# Patient Record
Sex: Female | Born: 1990
Health system: Southern US, Community
[De-identification: ages and names within clinical notes are randomized; demographics above are authoritative.]

## PROBLEM LIST (undated history)

## (undated) ENCOUNTER — Emergency Department: Admission: EM | Payer: BLUE CROSS/BLUE SHIELD | Source: Home / Self Care

## (undated) DIAGNOSIS — K219 Gastro-esophageal reflux disease without esophagitis: Secondary | ICD-10-CM

## (undated) DIAGNOSIS — E7800 Pure hypercholesterolemia, unspecified: Secondary | ICD-10-CM

## (undated) DIAGNOSIS — G43909 Migraine, unspecified, not intractable, without status migrainosus: Secondary | ICD-10-CM

## (undated) DIAGNOSIS — R109 Unspecified abdominal pain: Secondary | ICD-10-CM

## (undated) DIAGNOSIS — D509 Iron deficiency anemia, unspecified: Secondary | ICD-10-CM

## (undated) DIAGNOSIS — I10 Essential (primary) hypertension: Secondary | ICD-10-CM

## (undated) DIAGNOSIS — D649 Anemia, unspecified: Secondary | ICD-10-CM

## (undated) DIAGNOSIS — T8859XA Other complications of anesthesia, initial encounter: Secondary | ICD-10-CM

## (undated) DIAGNOSIS — T4145XA Adverse effect of unspecified anesthetic, initial encounter: Secondary | ICD-10-CM

## (undated) DIAGNOSIS — C4490 Unspecified malignant neoplasm of skin, unspecified: Secondary | ICD-10-CM

## (undated) HISTORY — DX: Migraine, unspecified, not intractable, without status migrainosus: G43.909

## (undated) HISTORY — PX: WISDOM TOOTH EXTRACTION: SHX21

## (undated) HISTORY — DX: Pure hypercholesterolemia, unspecified: E78.00

## (undated) HISTORY — PX: TONSILLECTOMY: SUR1361

## (undated) HISTORY — DX: Anemia, unspecified: D64.9

## (undated) HISTORY — PX: FOOT SURGERY: SHX648

## (undated) HISTORY — DX: Iron deficiency anemia, unspecified: D50.9

## (undated) HISTORY — DX: Essential (primary) hypertension: I10

## (undated) HISTORY — DX: Unspecified malignant neoplasm of skin, unspecified: C44.90

---
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
## 2011-06-09 ENCOUNTER — Telehealth: Payer: Self-pay | Admitting: Internal Medicine

## 2011-06-09 MED ORDER — BENZONATATE 100 MG PO CAPS: 100.0000 mg | ORAL_CAPSULE | Freq: Two times a day (BID) | ORAL | Status: AC | PRN

## 2011-06-09 NOTE — Telephone Encounter (Signed)
Patient's mother called and states she has had a cough x3 weeks and it's worse at night but she has a headache also has tried Desylm, Tylenol cold and nothing is working. Would like to know what you suggest.

## 2011-06-09 NOTE — Telephone Encounter (Signed)
Left message on cell phone voicemail for patient's mom (Tammy) to return call.

## 2011-06-09 NOTE — Telephone Encounter (Signed)
Patients mother advised as instructed via telephone.  She would like a Rx for [employer] sent to [facility].

## 2011-06-09 NOTE — Telephone Encounter (Signed)
We could try tessalon perles.  We need to verify no drug allergies and pharmacy and I will send in. Drink lots of fluids.  Treat sympotmatically with Mucinex, nasal saline irrigation, and Tylenol/Ibuprofen.

## 2011-06-18 ENCOUNTER — Other Ambulatory Visit: Payer: Self-pay | Admitting: Family Medicine

## 2011-06-30 ENCOUNTER — Telehealth: Payer: Self-pay | Admitting: Family Medicine

## 2011-06-30 NOTE — Telephone Encounter (Signed)
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

## 2011-06-30 NOTE — Telephone Encounter (Signed)
Yes ok for her to have TB test.

## 2011-07-02 NOTE — Telephone Encounter (Signed)
Spoke with Tammy patients mom and scheduled patient for nurse visit tomorrow.

## 2011-07-03 ENCOUNTER — Ambulatory Visit (INDEPENDENT_AMBULATORY_CARE_PROVIDER_SITE_OTHER): Payer: BC Managed Care – PPO | Admitting: *Deleted

## 2011-07-03 DIAGNOSIS — Z111 Encounter for screening for respiratory tuberculosis: Secondary | ICD-10-CM

## 2011-07-06 LAB — TB SKIN TEST
Induration: 0
TB Skin Test: NEGATIVE mm

## 2011-07-15 ENCOUNTER — Telehealth: Payer: Self-pay | Admitting: Family Medicine

## 2011-07-15 ENCOUNTER — Other Ambulatory Visit: Payer: Self-pay | Admitting: *Deleted

## 2011-07-15 MED ORDER — NORGESTIM-ETH ESTRAD TRIPHASIC 0.18/0.215/0.25 MG-35 MCG PO TABS: 1.0000 | ORAL_TABLET | Freq: Every day | ORAL | Status: DC

## 2011-07-15 NOTE — Telephone Encounter (Signed)
To: [facility] (Daytime Triage) Fax: [phone] From: [facility] Date/ Time: 07/15/2011 9:44 AM Taken By: Patria Mane, CSR Caller: Tammy Facility: not collected Patient: [name], [name] DOB: 1990-10-18 Phone: [phone] Reason for Call: Calling for a refill on daughter's Trisprintec. Mom states she called [facility] and they informed her to call the office since Rx did not have any refills. Regarding Appointment: Appt Date: Appt Time: Unknown Provider: Reason: Details: Outcome:

## 2011-07-15 NOTE — Telephone Encounter (Signed)
Rx sent to [facility] mom advised as instructed via telephone.

## 2012-01-21 ENCOUNTER — Encounter: Payer: Self-pay | Admitting: Family Medicine

## 2012-01-21 ENCOUNTER — Ambulatory Visit (INDEPENDENT_AMBULATORY_CARE_PROVIDER_SITE_OTHER): Payer: BC Managed Care – PPO | Admitting: Family Medicine

## 2012-01-21 ENCOUNTER — Ambulatory Visit: Payer: BC Managed Care – PPO | Admitting: Family Medicine

## 2012-01-21 VITALS — BP 108/72 | HR 72 | Temp 98.4°F | Wt 121.0 lb

## 2012-01-21 DIAGNOSIS — B49 Unspecified mycosis: Secondary | ICD-10-CM

## 2012-01-21 DIAGNOSIS — A499 Bacterial infection, unspecified: Secondary | ICD-10-CM

## 2012-01-21 DIAGNOSIS — B379 Candidiasis, unspecified: Secondary | ICD-10-CM

## 2012-01-21 DIAGNOSIS — R300 Dysuria: Secondary | ICD-10-CM

## 2012-01-21 DIAGNOSIS — B9689 Other specified bacterial agents as the cause of diseases classified elsewhere: Secondary | ICD-10-CM

## 2012-01-21 DIAGNOSIS — N760 Acute vaginitis: Secondary | ICD-10-CM

## 2012-01-21 LAB — POCT URINALYSIS DIPSTICK
Bilirubin, UA: NEGATIVE
Blood, UA: NEGATIVE
Glucose, UA: NEGATIVE
Ketones, UA: NEGATIVE
Leukocytes, UA: NEGATIVE
Nitrite, UA: NEGATIVE
Protein, UA: NEGATIVE
Spec Grav, UA: 1.01
Urobilinogen, UA: NEGATIVE
pH, UA: 7

## 2012-01-21 MED ORDER — METRONIDAZOLE 500 MG PO TABS: ORAL_TABLET | ORAL | Status: DC

## 2012-01-21 MED ORDER — FLUCONAZOLE 150 MG PO TABS: 150.0000 mg | ORAL_TABLET | Freq: Once | ORAL | Status: AC

## 2012-01-21 NOTE — Progress Notes (Signed)
Nature conservation officer at [facility] [address] [place] [address] Phone: [phone] Fax: [phone]  Date:  01/21/2012   Name:  [name]   DOB:  08/07/1990   MRN:  [id number] Gender: female  Age: 21 y.o.  PCP:  Ruthe Mannan, MD    Chief Complaint: Burning with urination, itching.   History of Present Illness:  CODY OLIGER is a 21 y.o. pleasant patient who presents with the following:  Dysuria Urgency  The patient is having some dysuria and urgency over the last few days. She is having some pain when she wipes after she goes to the bathroom. She denies any STD exposure, though she is sexually active. No abdominal pain. No significant vaginal discharge. No flank pain. No back pain. No hematuria.   Patient Active Problem List  Diagnosis  . ANEMIA-IRON DEFICIENCY  . ANEMIA, IRON DEFICIENCY, HX OF  . CONTACT DERMATITIS&OTHER ECZEMA DUE UNSPEC CAUSE    No past medical history on file.  No past surgical history on file.  History  Substance Use Topics  . Smoking status: Never Smoker   . Smokeless tobacco: Not on file  . Alcohol Use: Not on file    No family history on file.  No Known Allergies  Medication list has been reviewed and updated.  Current Outpatient Prescriptions on File Prior to Visit  Medication Sig Dispense Refill  . Norgestimate-Ethinyl Estradiol Triphasic (TRI-SPRINTEC) 0.18/0.215/0.25 MG-35 MCG tablet Take 1 tablet by mouth daily.  1 Package  12    Review of Systems:  ROS: GEN: Acute illness details above GI: Tolerating PO intake GU: maintaining adequate hydration and urination Pulm: No SOB Interactive and getting along well at home.  Otherwise, ROS is as per the HPI.   Physical Examination: Filed Vitals:   01/21/12 1507  BP: 108/72  Pulse: 72  Temp: 98.4 F (36.9 C)   Filed Vitals:   01/21/12 1507  Weight: 121 lb (54.885 kg)   There is no height on file to calculate BMI. Ideal Body Weight:     GEN: WDWN,  NAD, Non-toxic, Alert & Oriented x 3 HEENT: Atraumatic, Normocephalic.  Ears and Nose: No external deformity. EXTR: No clubbing/cyanosis/edema ABD: s, nt, nd, +BS GU: Normal introitus, exterior labia are moderately irritated appearing, and there is some small amount of discharge. Full pelvic not done, and patient vaginal wall was wiped for wet prep. NEURO: Normal gait.  PSYCH: Normally interactive. Conversant. Not depressed or anxious appearing.  Calm demeanor.    Assessment and Plan:  1. Dysuria  POCT urinalysis dipstick, Urine culture  2. Yeast infection    3. Bacterial vaginosis     Wet prep is consistent with notable yeast on slide, with some occasional BV. Will treat accordingly.  Obtain a urine culture given dysuria is a well, but urinalysis was normal.  Results for orders placed in visit on 01/21/12  POCT URINALYSIS DIPSTICK      Component Value Range   Color, UA yellow     Clarity, UA clear     Glucose, UA negative     Bilirubin, UA negative     Ketones, UA negative     Spec Grav, UA 1.010     Blood, UA negative     pH, UA 7.0     Protein, UA negative     Urobilinogen, UA negative     Nitrite, UA negative     Leukocytes, UA Negative      Orders  Today:  Orders Placed This Encounter  Procedures  . Urine culture  . POCT urinalysis dipstick    Medications Today: (Includes new updates added during medication reconciliation) Meds ordered this encounter  Medications  . fluconazole (DIFLUCAN) 150 MG tablet    Sig: Take 1 tablet (150 mg total) by mouth once.    Dispense:  1 tablet    Refill:  0  . metroNIDAZOLE (FLAGYL) 500 MG tablet    Sig: 4 tablets at once    Dispense:  4 tablet    Refill:  0    Medications Discontinued: There are no discontinued medications.   Hannah Beat, MD

## 2012-01-23 LAB — URINE CULTURE
Colony Count: NO GROWTH
Organism ID, Bacteria: NO GROWTH

## 2012-02-02 ENCOUNTER — Other Ambulatory Visit: Payer: Self-pay | Admitting: Family Medicine

## 2012-02-02 NOTE — Telephone Encounter (Signed)
Refill request for tri sprintec.  Patient hasn't been seen in over a year and has no upcoming appts scheduled.

## 2012-05-14 ENCOUNTER — Emergency Department: Payer: Self-pay | Admitting: Emergency Medicine

## 2012-07-07 ENCOUNTER — Other Ambulatory Visit: Payer: Self-pay | Admitting: Family Medicine

## 2012-07-19 ENCOUNTER — Other Ambulatory Visit: Payer: Self-pay | Admitting: Family Medicine

## 2012-07-22 ENCOUNTER — Other Ambulatory Visit: Payer: Self-pay | Admitting: Family Medicine

## 2012-07-22 ENCOUNTER — Telehealth: Payer: Self-pay | Admitting: Family Medicine

## 2012-07-22 MED ORDER — NORGESTIM-ETH ESTRAD TRIPHASIC 0.18/0.215/0.25 MG-35 MCG PO TABS: ORAL_TABLET | ORAL | Status: DC

## 2012-07-22 NOTE — Telephone Encounter (Signed)
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 Is this ok to schedule? Thank you.

## 2012-07-22 NOTE — Telephone Encounter (Signed)
appt has been scheduled.

## 2012-07-22 NOTE — Telephone Encounter (Signed)
pts mother left v/m requesting refill tri-sprintec until CPX 09/20/12. Pt last seen 07/15/10.Please advise.[facility].

## 2012-07-22 NOTE — Telephone Encounter (Signed)
Yes ok to schedule.

## 2012-07-22 NOTE — Telephone Encounter (Signed)
Pt's mother called in and says pt is due for a CPE w/Pap. Pt is in school and also works so can only be seen on Fridays, however you have no Friday CPE's on your schedule. Pt has one month of her birth control pills and will be out. Is there any way you can accommodate her a Friday CPE w/pap apptmt on a Friday w/in the next month? Thank you.

## 2012-07-22 NOTE — Telephone Encounter (Signed)
Sch pt apptmt 08/12/2012

## 2012-07-22 NOTE — Telephone Encounter (Signed)
Yes ok to accommodate. 

## 2012-07-26 ENCOUNTER — Ambulatory Visit: Payer: BC Managed Care – PPO

## 2012-07-26 ENCOUNTER — Other Ambulatory Visit: Payer: Self-pay | Admitting: Family Medicine

## 2012-07-26 DIAGNOSIS — Z862 Personal history of diseases of the blood and blood-forming organs and certain disorders involving the immune mechanism: Secondary | ICD-10-CM

## 2012-07-26 DIAGNOSIS — Z0000 Encounter for general adult medical examination without abnormal findings: Secondary | ICD-10-CM | POA: Insufficient documentation

## 2012-07-26 DIAGNOSIS — Z136 Encounter for screening for cardiovascular disorders: Secondary | ICD-10-CM

## 2012-08-05 ENCOUNTER — Other Ambulatory Visit: Payer: BC Managed Care – PPO

## 2012-08-09 ENCOUNTER — Telehealth: Payer: Self-pay

## 2012-08-09 ENCOUNTER — Other Ambulatory Visit: Payer: Self-pay | Admitting: Family Medicine

## 2012-08-09 NOTE — Telephone Encounter (Signed)
pts mother request lab test for hepatitis B titer. 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 Pt already scheduled for CPX labs on 09/13/12 and pts mother wants to know if can have CPX labs done when get hep B titer.Please advise.

## 2012-08-09 NOTE — Telephone Encounter (Signed)
Left message on voice mail advising mother test will be added on.

## 2012-08-09 NOTE — Telephone Encounter (Signed)
Yes ok to check Hep titer.

## 2012-08-10 ENCOUNTER — Other Ambulatory Visit: Payer: Self-pay | Admitting: Family Medicine

## 2012-08-10 ENCOUNTER — Other Ambulatory Visit (INDEPENDENT_AMBULATORY_CARE_PROVIDER_SITE_OTHER): Payer: BC Managed Care – PPO

## 2012-08-10 DIAGNOSIS — Z862 Personal history of diseases of the blood and blood-forming organs and certain disorders involving the immune mechanism: Secondary | ICD-10-CM

## 2012-08-10 DIAGNOSIS — Z136 Encounter for screening for cardiovascular disorders: Secondary | ICD-10-CM

## 2012-08-10 DIAGNOSIS — Z0000 Encounter for general adult medical examination without abnormal findings: Secondary | ICD-10-CM

## 2012-08-10 LAB — LIPID PANEL
HDL: 64.7 mg/dL (ref >39.00)
Total CHOL/HDL Ratio: 3
Triglycerides: 134 mg/dL (ref 0.0–149.0)

## 2012-08-10 LAB — CBC WITH DIFFERENTIAL/PLATELET
Basophils Absolute: 0 10*3/uL (ref 0.0–0.1)
Eosinophils Relative: 1.7 % (ref 0.0–5.0)
MCV: 80.4 fl (ref 78.0–100.0)
Monocytes Absolute: 0.4 10*3/uL (ref 0.1–1.0)
Neutrophils Relative %: 45.7 % (ref 43.0–77.0)
Platelets: 247 10*3/uL (ref 150.0–400.0)
RDW: 15.4 % — ABNORMAL HIGH (ref 11.5–14.6)
WBC: 5.6 10*3/uL (ref 4.5–10.5)

## 2012-08-10 LAB — COMPREHENSIVE METABOLIC PANEL
AST: 18 U/L (ref 0–37)
Alkaline Phosphatase: 57 U/L (ref 39–117)
BUN: 8 mg/dL (ref 6–23)
Calcium: 8.9 mg/dL (ref 8.4–10.5)
Creatinine, Ser: 0.8 mg/dL (ref 0.4–1.2)

## 2012-08-10 LAB — LDL CHOLESTEROL, DIRECT: Direct LDL: 120.5 mg/dL

## 2012-08-11 ENCOUNTER — Other Ambulatory Visit (INDEPENDENT_AMBULATORY_CARE_PROVIDER_SITE_OTHER): Payer: BC Managed Care – PPO

## 2012-08-11 DIAGNOSIS — Z0000 Encounter for general adult medical examination without abnormal findings: Secondary | ICD-10-CM

## 2012-08-11 DIAGNOSIS — Z862 Personal history of diseases of the blood and blood-forming organs and certain disorders involving the immune mechanism: Secondary | ICD-10-CM

## 2012-08-11 DIAGNOSIS — Z136 Encounter for screening for cardiovascular disorders: Secondary | ICD-10-CM

## 2012-08-11 DIAGNOSIS — Z0184 Encounter for antibody response examination: Secondary | ICD-10-CM

## 2012-08-11 LAB — LIPID PANEL: Cholesterol: 256 mg/dL — ABNORMAL HIGH (ref 0–200)

## 2012-08-11 LAB — COMPREHENSIVE METABOLIC PANEL
Albumin: 3.3 g/dL — ABNORMAL LOW (ref 3.5–5.2)
Alkaline Phosphatase: 63 U/L (ref 39–117)
BUN: 10 mg/dL (ref 6–23)
CO2: 25 mEq/L (ref 19–32)
GFR: 104.39 mL/min (ref >60.00)
Glucose, Bld: 84 mg/dL (ref 70–99)
Potassium: 3.9 mEq/L (ref 3.5–5.1)
Total Bilirubin: 0.5 mg/dL (ref 0.3–1.2)
Total Protein: 6.9 g/dL (ref 6.0–8.3)

## 2012-08-11 LAB — CBC WITH DIFFERENTIAL/PLATELET
Basophils Relative: 0.3 % (ref 0.0–3.0)
Eosinophils Relative: 2.1 % (ref 0.0–5.0)
MCV: 77.3 fl — ABNORMAL LOW (ref 78.0–100.0)
Monocytes Absolute: 0.3 10*3/uL (ref 0.1–1.0)
Monocytes Relative: 5.8 % (ref 3.0–12.0)
Neutrophils Relative %: 52.9 % (ref 43.0–77.0)
Platelets: 269 10*3/uL (ref 150.0–400.0)
RBC: 4.5 Mil/uL (ref 3.87–5.11)
WBC: 5.8 10*3/uL (ref 4.5–10.5)

## 2012-08-11 LAB — LDL CHOLESTEROL, DIRECT: Direct LDL: 175.2 mg/dL

## 2012-08-12 ENCOUNTER — Encounter: Payer: Self-pay | Admitting: Family Medicine

## 2012-08-12 ENCOUNTER — Ambulatory Visit (INDEPENDENT_AMBULATORY_CARE_PROVIDER_SITE_OTHER): Payer: BC Managed Care – PPO | Admitting: Family Medicine

## 2012-08-12 VITALS — BP 122/84 | HR 76 | Temp 98.8°F | Ht 63.75 in | Wt 143.0 lb

## 2012-08-12 DIAGNOSIS — Z309 Encounter for contraceptive management, unspecified: Secondary | ICD-10-CM

## 2012-08-12 DIAGNOSIS — Z0000 Encounter for general adult medical examination without abnormal findings: Secondary | ICD-10-CM

## 2012-08-12 DIAGNOSIS — Z862 Personal history of diseases of the blood and blood-forming organs and certain disorders involving the immune mechanism: Secondary | ICD-10-CM

## 2012-08-12 MED ORDER — NORGESTIM-ETH ESTRAD TRIPHASIC 0.18/0.215/0.25 MG-35 MCG PO TABS: ORAL_TABLET | ORAL | Status: DC

## 2012-08-12 MED ORDER — FERROUS SULFATE 325 (65 FE) MG PO TABS: 325.0000 mg | ORAL_TABLET | Freq: Every day | ORAL | Status: DC

## 2012-08-12 NOTE — Patient Instructions (Addendum)
Please restart your iron- once a day. Come see me after your birthday for your first pap smear.  Congratulations on your engagement!

## 2012-08-12 NOTE — Progress Notes (Signed)
Subjective:    Patient ID: Natalie Wu, female    DOB: August 19, 1990, 22 y.o.   MRN: [id number]  HPI 22 yo very pleasant female here for CPX.  Doing well.  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  Got engaged recently!  H/o iron deficiency anemia- was taking oral  iron 325 mg daily but stopped taking it because she felt it was not working.   Was referred to hematology, Dr. Francee Piccolo and work up was negative.  Twin sister was referred for same issue and has had issues with iron deficiency anemia since she was a small child.   Has had two receive two courses of IV iron, most recently October 2011. Had blood transfusion last year.  Denies any fatigue, SOB, DOE.   Lab Results  Component Value Date   WBC 5.6 08/10/2012   HGB 11.5* 08/10/2012   HCT 34.2* 08/10/2012   MCV 80.4 08/10/2012   PLT 247.0 08/10/2012    She is sexually active with her fiance only.  On Sprintec to control her periods, to prevent heavy bleeding from worsening her anemia although she was diagnosed with anemia prior to menstruating  Lab Results  Component Value Date   CHOL 205* 08/10/2012   HDL 64.70 08/10/2012   LDLDIRECT 120.5 08/10/2012   TRIG 134.0 08/10/2012   CHOLHDL 3 08/10/2012    Review of Systems See HPI Patient reports no  vision/ hearing changes,anorexia, weight change, fever ,adenopathy, persistant / recurrent hoarseness, swallowing issues, chest pain, edema,persistant / recurrent cough, hemoptysis, dyspnea(rest, exertional, paroxysmal nocturnal), gastrointestinal  bleeding (melena, rectal bleeding), abdominal pain, excessive heart burn, GU symptoms(dysuria, hematuria, pyuria, voiding/incontinence  Issues) syncope, focal weakness, severe memory loss, concerning skin lesions, depression, anxiety, abnormal bruising/bleeding, major joint swelling, breast masses or abnormal vaginal bleeding.       Objective:   Physical Exam BP 122/84  Pulse 76  Temp(Src) 98.8 F (37.1 C)  Ht 5' 3.75" (1.619 m)  Wt  143 lb (64.864 kg)  BMI 24.75 kg/m2  General:  Well-developed,well-nourished,in no acute distress; alert,appropriate and cooperative throughout examination Head:  normocephalic and atraumatic.   Eyes:  vision grossly intact, pupils equal, pupils round, and pupils reactive to light.   Ears:  R ear normal and L ear normal.   Nose:  no external deformity.   Mouth:  good dentition.   Neck:  No deformities, masses, or tenderness noted. Lungs:  Normal respiratory effort, chest expands symmetrically. Lungs are clear to auscultation, no crackles or wheezes. Heart:  Normal rate and regular rhythm. S1 and S2 normal without gallop, murmur, click, rub or other extra sounds. Abdomen:  Bowel sounds positive,abdomen soft and non-tender without masses, organomegaly or hernias noted. Msk:  No deformity or scoliosis noted of thoracic or lumbar spine.   Extremities:  No clubbing, cyanosis, edema, or deformity noted with normal full range of motion of all joints.   Neurologic:  alert & oriented X3 and gait normal.   Skin:  Intact without suspicious lesions or rashes Cervical Nodes:  No lymphadenopathy noted Axillary Nodes:  No palpable lymphadenopathy Psych:  Cognition and judgment appear intact. Alert and cooperative with normal attention span and concentration. No apparent delusions, illusions, hallucinations    Assessment & Plan:  1. Routine general medical examination at a health care facility Reviewed preventive care protocols, scheduled due services, and updated immunizations Discussed nutrition, exercise, diet, and healthy lifestyle.    2. ANEMIA, IRON DEFICIENCY, HX OF H/H has dropped since we  last checked CBC two years ago.  Advised restarting iron.  Recheck CBC in 3-6 months.  3. Contraception management Doing well with current OCPs.  Rx refilled.

## 2012-08-15 ENCOUNTER — Telehealth: Payer: Self-pay

## 2012-08-15 NOTE — Telephone Encounter (Signed)
Pt request recent lab results.

## 2012-08-15 NOTE — Telephone Encounter (Signed)
Patient has been advised

## 2012-08-17 ENCOUNTER — Ambulatory Visit (INDEPENDENT_AMBULATORY_CARE_PROVIDER_SITE_OTHER): Payer: BC Managed Care – PPO | Admitting: *Deleted

## 2012-08-17 DIAGNOSIS — Z111 Encounter for screening for respiratory tuberculosis: Secondary | ICD-10-CM

## 2012-08-17 DIAGNOSIS — Z23 Encounter for immunization: Secondary | ICD-10-CM

## 2012-08-17 NOTE — Telephone Encounter (Signed)
Patient came in for Hep B and PPD and she's had the Hep B series ( copy of her vaccines sent for scanning) and even though her titer came back not immune the pt and her mother are questioning if she needs the series again? I spoke with Dr.Copland and he advised giving 1 injection and to come back for a titer? Also he wanted me to forward to Dr. Dayton Martes and see if the series is needed?

## 2012-08-17 NOTE — Telephone Encounter (Signed)
How long should she wait before she has the titre done?

## 2012-08-17 NOTE — Telephone Encounter (Signed)
I would wait 2-3 weeks.

## 2012-08-17 NOTE — Telephone Encounter (Signed)
Yes I agree that we could give her one and recheck the titers.

## 2012-08-18 NOTE — Telephone Encounter (Signed)
Advised mother.  Lab appt scheduled.

## 2012-08-18 NOTE — Telephone Encounter (Signed)
Left message asking mother to call back

## 2012-08-19 LAB — TB SKIN TEST: TB Skin Test: NEGATIVE

## 2012-09-05 ENCOUNTER — Other Ambulatory Visit (INDEPENDENT_AMBULATORY_CARE_PROVIDER_SITE_OTHER): Payer: BC Managed Care – PPO

## 2012-09-05 DIAGNOSIS — Z9189 Other specified personal risk factors, not elsewhere classified: Secondary | ICD-10-CM

## 2012-09-06 LAB — HEPATITIS B SURFACE ANTIBODY,QUALITATIVE: Hep B Surface Ab, Qual: REACTIVE

## 2012-09-07 ENCOUNTER — Other Ambulatory Visit: Payer: Self-pay | Admitting: Family Medicine

## 2012-09-13 ENCOUNTER — Other Ambulatory Visit: Payer: BC Managed Care – PPO

## 2012-09-19 ENCOUNTER — Encounter: Payer: Self-pay | Admitting: *Deleted

## 2012-09-20 ENCOUNTER — Ambulatory Visit (INDEPENDENT_AMBULATORY_CARE_PROVIDER_SITE_OTHER): Payer: BC Managed Care – PPO | Admitting: Family Medicine

## 2012-09-20 ENCOUNTER — Encounter: Payer: Self-pay | Admitting: Family Medicine

## 2012-09-20 VITALS — BP 110/74 | HR 60 | Temp 98.2°F | Ht 63.5 in | Wt 121.0 lb

## 2012-09-20 DIAGNOSIS — Z862 Personal history of diseases of the blood and blood-forming organs and certain disorders involving the immune mechanism: Secondary | ICD-10-CM

## 2012-09-20 DIAGNOSIS — Z0000 Encounter for general adult medical examination without abnormal findings: Secondary | ICD-10-CM

## 2012-09-20 DIAGNOSIS — E785 Hyperlipidemia, unspecified: Secondary | ICD-10-CM

## 2012-09-20 MED ORDER — NORGESTIM-ETH ESTRAD TRIPHASIC 0.18/0.215/0.25 MG-35 MCG PO TABS: ORAL_TABLET | ORAL | Status: DC

## 2012-09-20 NOTE — Patient Instructions (Addendum)
Please follow low cholesterol diet as discussed.  Then come back in 2 months to recheck your cholesterol. Great to see you and good luck with school!

## 2012-09-20 NOTE — Progress Notes (Signed)
Subjective:    Patient ID: [name], female    DOB: 11-11-1990, 22 y.o.   MRN: [id number]  HPI  22 yo very pleasant female here for CPX.  School is going well.  Chief complaint states CPX and pap smear but I did explain to [place] that I do not perform pap smears on females less than 88 years old.  She is in agreement with this plan.  She is taking OCPs, Trisprintec.  Denies any spotting during the month.  Not currently sexually active. Would like refills on this today.  HLD- strong family h/o HLD- grandmother, dad.  Does not eat red meat.  Does admit to eating french fries regularly. Lab Results  Component Value Date   CHOL 256* 08/11/2012   HDL 63.50 08/11/2012   LDLDIRECT 175.2 08/11/2012   TRIG 189.0* 08/11/2012   CHOLHDL 4 08/11/2012   H/o anemia- periods are much better now that she is on OCPs.  Denies fatigue. Lab Results  Component Value Date   WBC 5.8 08/11/2012   HGB 11.5* 08/11/2012   HCT 34.8* 08/11/2012   MCV 77.3* 08/11/2012   PLT 269.0 08/11/2012    Patient Active Problem List  Diagnosis  . ANEMIA, IRON DEFICIENCY, HX OF  . Routine general medical examination at a health care facility   No past medical history on file. No past surgical history on file. History  Substance Use Topics  . Smoking status: Never Smoker   . Smokeless tobacco: Not on file  . Alcohol Use: Not on file   No family history on file. No Known Allergies Current Outpatient Prescriptions on File Prior to Visit  Medication Sig Dispense Refill  . TRI-SPRINTEC 0.18/0.215/0.25 MG-35 MCG tablet TAKE ONE TABLET BY MOUTH ONE TIME DAILY  1 Package  0   No current facility-administered medications on file prior to visit.   The PMH, PSH, Social History, Family History, Medications, and allergies have been reviewed in [facility], and have been updated if relevant.   Review of Systems See HPI Patient reports no  vision/ hearing changes,anorexia, weight change, fever ,adenopathy, persistant /  recurrent hoarseness, swallowing issues, chest pain, edema,persistant / recurrent cough, hemoptysis, dyspnea(rest, exertional, paroxysmal nocturnal), gastrointestinal  bleeding (melena, rectal bleeding), abdominal pain, excessive heart burn, GU symptoms(dysuria, hematuria, pyuria, voiding/incontinence  Issues) syncope, focal weakness, severe memory loss, concerning skin lesions, depression, anxiety, abnormal bruising/bleeding, major joint swelling, breast masses or abnormal vaginal bleeding.       Objective:   Physical Exam BP 110/74  Pulse 60  Temp(Src) 98.2 F (36.8 C)  Ht 5' 3.5" (1.613 m)  Wt 121 lb (54.885 kg)  BMI 21.1 kg/m2  General:  Well-developed,well-nourished,in no acute distress; alert,appropriate and cooperative throughout examination Head:  normocephalic and atraumatic.   Eyes:  vision grossly intact, pupils equal, pupils round, and pupils reactive to light.   Ears:  R ear normal and L ear normal.   Nose:  no external deformity.   Mouth:  good dentition.   Neck:  No deformities, masses, or tenderness noted.  Lungs:  Normal respiratory effort, chest expands symmetrically. Lungs are clear to auscultation, no crackles or wheezes. Heart:  Normal rate and regular rhythm. S1 and S2 normal without gallop, murmur, click, rub or other extra sounds. Abdomen:  Bowel sounds positive,abdomen soft and non-tender without masses, organomegaly or hernias noted. Msk:  No deformity or scoliosis noted of thoracic or lumbar spine.   Extremities:  No clubbing, cyanosis, edema, or deformity noted with normal  full range of motion of all joints.   Neurologic:  alert & oriented X3 and gait normal.   Skin:  Intact without suspicious lesions or rashes Cervical Nodes:  No lymphadenopathy noted Axillary Nodes:  No palpable lymphadenopathy Psych:  Cognition and judgment appear intact. Alert and cooperative with normal attention span and concentration. No apparent delusions, illusions,  hallucinations        Assessment & Plan:  1. Routine general medical examination at a health care facility Reviewed preventive care protocols, scheduled due services, and updated immunizations Discussed nutrition, exercise, diet, and healthy lifestyle.  2. ANEMIA, IRON DEFICIENCY, HX OF Recheck CBC in 8 weeks.  3. HLD (hyperlipidemia) New- discussed with pt.  Given that she is of child bearing age, I would prefer to work on diet first and [place] is agreement with this. Handout given about cholesterol.  Follow up labs in 8 weeks. The patient indicates understanding of these issues and agrees with the plan.

## 2012-11-21 ENCOUNTER — Ambulatory Visit: Payer: BC Managed Care – PPO | Admitting: Family Medicine

## 2012-11-21 DIAGNOSIS — Z0289 Encounter for other administrative examinations: Secondary | ICD-10-CM

## 2013-03-06 ENCOUNTER — Other Ambulatory Visit: Payer: Self-pay | Admitting: Family Medicine

## 2013-03-21 ENCOUNTER — Other Ambulatory Visit: Payer: Self-pay | Admitting: Family Medicine

## 2013-06-19 ENCOUNTER — Telehealth: Payer: Self-pay

## 2013-06-19 NOTE — Telephone Encounter (Signed)
Pt request when had last PPD; advised PPD done 08/17/2012. Pt voiced understanding.

## 2013-06-19 NOTE — Telephone Encounter (Signed)
Opened in error

## 2013-07-26 ENCOUNTER — Other Ambulatory Visit: Payer: Self-pay | Admitting: Family Medicine

## 2013-07-27 ENCOUNTER — Other Ambulatory Visit: Payer: Self-pay | Admitting: Family Medicine

## 2013-08-01 ENCOUNTER — Telehealth: Payer: Self-pay

## 2013-08-01 NOTE — Telephone Encounter (Signed)
[name] wanted to verify pt had refills on trispintec; advised would need to ck with pharmacy. [name] at [facility] told me pt has available refills.

## 2013-08-04 ENCOUNTER — Encounter: Payer: Self-pay | Admitting: Family Medicine

## 2013-08-04 ENCOUNTER — Ambulatory Visit (INDEPENDENT_AMBULATORY_CARE_PROVIDER_SITE_OTHER): Payer: BC Managed Care – PPO | Admitting: Family Medicine

## 2013-08-04 VITALS — BP 114/62 | HR 87 | Temp 98.5°F | Ht 63.0 in | Wt 144.5 lb

## 2013-08-04 DIAGNOSIS — Z309 Encounter for contraceptive management, unspecified: Secondary | ICD-10-CM

## 2013-08-04 DIAGNOSIS — Z862 Personal history of diseases of the blood and blood-forming organs and certain disorders involving the immune mechanism: Secondary | ICD-10-CM

## 2013-08-04 NOTE — Patient Instructions (Signed)
I will call you with your lab results  Please schedule your physical/pap smear on your way out.

## 2013-08-04 NOTE — Assessment & Plan Note (Signed)
Remains symptomatic despite light periods. Will recheck CBC today.  May need to re establish with hematology for IV iron. The patient indicates understanding of these issues and agrees with the plan.

## 2013-08-04 NOTE — Assessment & Plan Note (Signed)
Tri sprintec refilled. Advised she needs to get a pap smear/gyn exam now that she is 22.  Currently menstruating and would like to schedule this on another day. See AVS.

## 2013-08-04 NOTE — Addendum Note (Signed)
Addended by: Baldomero LamyHAVERS, Essie Lagunes C on: 08/04/2013 01:47 PM   Modules accepted: Orders

## 2013-08-04 NOTE — Progress Notes (Signed)
Pre visit review using our clinic review tool, if applicable. No additional management support is needed unless otherwise documented below in the visit note. 

## 2013-08-04 NOTE — Progress Notes (Signed)
  Subjective:    Patient ID: Natalie Wu, female    DOB: [birth year], 23 y.o.   MRN: [id number]  HPI 23 yo pleasant female here for "med refill." She is sexually active with her fiance only.  Has never had a pap smear.  On Sprintec to control her periods, to prevent heavy bleeding from worsening her anemia although she was diagnosed with anemia prior to menstruating  Lab Results  Component Value Date   CHOL 205* 08/10/2012   HDL 64.70 08/10/2012   LDLDIRECT 120.5 08/10/2012   TRIG 134.0 08/10/2012   CHOLHDL 3 08/10/2012   H/o iron deficiency anemia- was taking oral iron 325 mg daily but stopped taking it because she felt it was not working. Was referred to hematology, Dr. Francee PiccoloFennigan and work up was negative.  Twin sister was referred for same issue and has had issues with iron deficiency anemia since she was a small child.  Has had two receive two courses of IV iron, most recently October 2011. Had blood transfusion two years ago.  Advised restarting iron when I saw her last year and following up CBC 3 months later.  She did restart it (325 mg daily) but was lost to follow up. Lab Results  Component Value Date   WBC 5.6 08/10/2012   HGB 11.5* 08/10/2012   HCT 34.2* 08/10/2012   MCV 80.4 08/10/2012   PLT 247.0 08/10/2012      Review of Systems See HPI + fatigue Periods not heavy +/- dizziness when she stands from seated position     Objective:   Physical Exam BP 114/62  Pulse 87  Temp(Src) 98.5 F (36.9 C) (Oral)  Ht 5\' 3"  (1.6 m)  Wt 144 lb 8 oz (65.545 kg)  BMI 25.60 kg/m2  SpO2 98%  LMP 07/31/2013  General:  Well-developed,well-nourished,in no acute distress; alert,appropriate and cooperative throughout examination Head:  normocephalic and atraumatic.   Nose:  no external deformity.   Mouth:  good dentition.   Neck:  No deformities, masses, or tenderness noted. Lungs:  Normal respiratory effort, chest expands symmetrically. Lungs are clear to auscultation, no crackles or  wheezes. Heart:  Normal rate and regular rhythm. S1 and S2 normal without gallop, murmur, click, rub or other extra sounds. Neurologic:  alert & oriented X3 and gait normal.   Skin:  Intact without suspicious lesions or rashes Psych:  Cognition and judgment appear intact. Alert and cooperative with normal attention span and concentration. No apparent delusions, illusions, hallucinations    Assessment & Plan:

## 2013-08-05 LAB — CBC WITH DIFFERENTIAL/PLATELET
BASOS: 0 %
Basophils Absolute: 0 10*3/uL (ref 0.0–0.2)
EOS ABS: 0 10*3/uL (ref 0.0–0.4)
Eos: 1 %
HEMATOCRIT: 34.8 % (ref 34.0–46.6)
Hemoglobin: 11.2 g/dL (ref 11.1–15.9)
IMMATURE GRANS (ABS): 0 10*3/uL (ref 0.0–0.1)
Immature Granulocytes: 0 %
LYMPHS ABS: 2.3 10*3/uL (ref 0.7–3.1)
Lymphs: 27 %
MCH: 25.2 pg — AB (ref 26.6–33.0)
MCHC: 32.2 g/dL (ref 31.5–35.7)
MCV: 78 fL — AB (ref 79–97)
MONOS ABS: 0.4 10*3/uL (ref 0.1–0.9)
Monocytes: 5 %
NEUTROS PCT: 67 %
Neutrophils Absolute: 5.7 10*3/uL (ref 1.4–7.0)
RBC: 4.45 x10E6/uL (ref 3.77–5.28)
RDW: 14.7 % (ref 12.3–15.4)
WBC: 8.4 10*3/uL (ref 3.4–10.8)

## 2013-08-15 ENCOUNTER — Ambulatory Visit (INDEPENDENT_AMBULATORY_CARE_PROVIDER_SITE_OTHER): Payer: BC Managed Care – PPO | Admitting: Family Medicine

## 2013-08-15 ENCOUNTER — Encounter: Payer: Self-pay | Admitting: Family Medicine

## 2013-08-15 VITALS — BP 114/64 | HR 71 | Temp 97.9°F | Ht 63.0 in | Wt 114.5 lb

## 2013-08-15 DIAGNOSIS — Z309 Encounter for contraceptive management, unspecified: Secondary | ICD-10-CM | POA: Insufficient documentation

## 2013-08-15 MED ORDER — NORGESTIM-ETH ESTRAD TRIPHASIC 0.18/0.215/0.25 MG-35 MCG PO TABS: ORAL_TABLET | ORAL | Status: DC

## 2013-08-15 NOTE — Assessment & Plan Note (Signed)
She is currently on her period and would like to schedule Pap smear for another day. Trisprintec refilled for 1 year.

## 2013-08-15 NOTE — Progress Notes (Signed)
Pre visit review using our clinic review tool, if applicable. No additional management support is needed unless otherwise documented below in the visit note. 

## 2013-08-15 NOTE — Patient Instructions (Signed)
Good to see you. Please make sure you schedule a pap smear at your convenience.

## 2013-08-15 NOTE — Progress Notes (Signed)
   Subjective:   Patient ID: [name], female    DOB: September 14, 1990, 23 y.o.   MRN: [id number]  [name] is a pleasant 23 y.o. year old female who presents to clinic today with Medication Refill  on 08/15/2013  HPI: On Trisprintec for OCPs.  Denies any spotting during the month.  Cramping is minimal.  Currently on her period.  Has never had a pap smear.  Non smoker. Very physically active with her job.  Patient Active Problem List   Diagnosis Date Noted  . Contraception management 08/15/2013  . Routine general medical examination at a health care facility 09/20/2012  . HLD (hyperlipidemia) 09/20/2012  . ANEMIA, IRON DEFICIENCY, HX OF 06/18/2010   No past medical history on file. No past surgical history on file. History  Substance Use Topics  . Smoking status: Never Smoker   . Smokeless tobacco: Not on file  . Alcohol Use: Not on file   No family history on file. No Known Allergies No current outpatient prescriptions on file prior to visit.   No current facility-administered medications on file prior to visit.   The PMH, PSH, Social History, Family History, Medications, and allergies have been reviewed in [facility], and have been updated if relevant.  Review of Systems    See HPI Denies any vaginal discharge or irregular vaginal bleeding Objective:    BP 114/64  Pulse 71  Temp(Src) 97.9 F (36.6 C) (Oral)  Ht 5\' 3"  (1.6 m)  Wt 114 lb 8 oz (51.937 kg)  BMI 20.29 kg/m2  SpO2 99%  LMP 08/14/2013   Physical Exam Gen:  Alert, pleasant, NAD Psych:  Good eye contact.  Not anxious or depressed appearing.       Assessment & Plan:   Contraception management Return in about 7 days (around 08/22/2013) for Pap smear.

## 2013-08-27 ENCOUNTER — Other Ambulatory Visit: Payer: Self-pay | Admitting: Family Medicine

## 2013-09-12 ENCOUNTER — Emergency Department: Payer: Self-pay | Admitting: Internal Medicine

## 2013-09-22 ENCOUNTER — Ambulatory Visit (INDEPENDENT_AMBULATORY_CARE_PROVIDER_SITE_OTHER): Payer: BC Managed Care – PPO | Admitting: Internal Medicine

## 2013-09-22 ENCOUNTER — Encounter: Payer: Self-pay | Admitting: Internal Medicine

## 2013-09-22 VITALS — BP 122/84 | HR 74 | Temp 99.4°F | Wt 114.2 lb

## 2013-09-22 DIAGNOSIS — N898 Other specified noninflammatory disorders of vagina: Secondary | ICD-10-CM

## 2013-09-22 DIAGNOSIS — N390 Urinary tract infection, site not specified: Secondary | ICD-10-CM

## 2013-09-22 DIAGNOSIS — R300 Dysuria: Secondary | ICD-10-CM

## 2013-09-22 LAB — POCT URINALYSIS DIPSTICK
Bilirubin, UA: NEGATIVE
Glucose, UA: NEGATIVE
Ketones, UA: NEGATIVE
Nitrite, UA: NEGATIVE
PH UA: 6
Protein, UA: NEGATIVE
SPEC GRAV UA: 1.015
Urobilinogen, UA: 0.2

## 2013-09-22 MED ORDER — VALACYCLOVIR HCL 1 G PO TABS: 1000.0000 mg | ORAL_TABLET | Freq: Two times a day (BID) | ORAL | Status: DC

## 2013-09-22 MED ORDER — NITROFURANTOIN MONOHYD MACRO 100 MG PO CAPS: 100.0000 mg | ORAL_CAPSULE | Freq: Two times a day (BID) | ORAL | Status: DC

## 2013-09-22 NOTE — Patient Instructions (Addendum)
Genital Herpes  Genital herpes is a sexually transmitted disease. This means that it is a disease passed by having sex with an infected person. There is no cure for genital herpes. The time between attacks can be months to years. The virus may live in a person but produce no problems (symptoms). This infection can be passed to a baby as it travels down the birth canal (vagina). In a newborn, this can cause central nervous system damage, eye damage, or even death. The virus that causes genital herpes is usually HSV-2 virus. The virus that causes oral herpes is usually HSV-1. The diagnosis (learning what is wrong) is made through culture results.  SYMPTOMS   Usually symptoms of pain and itching begin a few days to a week after contact. It first appears as small blisters that progress to small painful ulcers which then scab over and heal after several days. It affects the outer genitalia, birth canal, cervix, penis, anal area, buttocks, and thighs.  HOME CARE INSTRUCTIONS   · Keep ulcerated areas dry and clean.  · Take medications as directed. Antiviral medications can speed up healing. They will not prevent recurrences or cure this infection. These medications can also be taken for suppression if there are frequent recurrences.  · While the infection is active, it is contagious. Avoid all sexual contact during active infections.  · Condoms may help prevent spread of the herpes virus.  · Practice safe sex.  · Wash your hands thoroughly after touching the genital area.  · Avoid touching your eyes after touching your genital area.  · Inform your caregiver if you have had genital herpes and become pregnant. It is your responsibility to insure a safe outcome for your baby in this pregnancy.  · Only take over-the-counter or prescription medicines for pain, discomfort, or fever as directed by your caregiver.  SEEK MEDICAL CARE IF:   · You have a recurrence of this infection.  · You do not respond to medications and are not  improving.  · You have new sources of pain or discharge which have changed from the original infection.  · You have an oral temperature above 102° F (38.9° C).  · You develop abdominal pain.  · You develop eye pain or signs of eye infection.  Document Released: 05/15/2000 Document Revised: 08/10/2011 Document Reviewed: 06/05/2009  ExitCare® Patient Information ©2014 [employer].

## 2013-09-22 NOTE — Progress Notes (Signed)
HPI  Pt presents to the clinic today with c/o dysuria. She reports this started 1 week ago. She denies fever, chills, nausea or back pain. She has never had a UTI in the past. She has not tried anything OTC.  Additionally, she c/o bumps in her vaginal area. She noticed this yesterday. She has never had bumps like this in the past. The bumps do no itch or burn. She denies vaginal discharge, or odor. She is sexually active. She does have unprotected sex but is on birth control.   Review of Systems  History reviewed. No pertinent past medical history.  History reviewed. No pertinent family history.  History   Social History  . Marital Status: Single    Spouse Name: N/A    Number of Children: N/A  . Years of Education: N/A   Occupational History  . Not on file.   Social History Main Topics  . Smoking status: Never Smoker   . Smokeless tobacco: Not on file  . Alcohol Use: No  . Drug Use: Not on file  . Sexual Activity: Not on file   Other Topics Concern  . Not on file   Social History Narrative  . No narrative on file    No Known Allergies  Constitutional: Denies fever, malaise, fatigue, headache or abrupt weight changes.   GU: Pt reports pain with urination. Denies burning sensation, blood in urine, odor or discharge. Skin: Pt reports vaginal lesion. Denies redness, rashes, or ulcercations.   No other specific complaints in a complete review of systems (except as listed in HPI above).    Objective:   Physical Exam  BP 122/84  Pulse 74  Temp(Src) 99.4 F (37.4 C) (Oral)  Wt 114 lb 4 oz (51.823 kg)  SpO2 99% Wt Readings from Last 3 Encounters:  09/22/13 114 lb 4 oz (51.823 kg)  08/15/13 114 lb 8 oz (51.937 kg)  09/20/12 121 lb (54.885 kg)    General: Appears her stated age, well developed, well nourished in NAD. Cardiovascular: Normal rate and rhythm. S1,S2 noted.  No murmur, rubs or gallops noted. No JVD or BLE edema. No carotid bruits  noted. Pulmonary/Chest: Normal effort and positive vesicular breath sounds. No respiratory distress. No wheezes, rales or ronchi noted.  Abdomen: Soft and nontender. Normal bowel sounds, no bruits noted. No distention or masses noted. Liver, spleen and kidneys non palpable. Tender to palpation over the bladder area. No CVA tenderness. Pelvic: several round ulcerations noted inside labia majoria. Erythematous base draining tan fluid, tender to touch.     Assessment & Plan:  Dysuria secondary to UTI:  Urinalysis: mod leuks, trace blood eRx sent if for Macrobid 100 mg BID x 5 days OK to take AZO OTC Drink plenty of fluids  Vaginal lesions:  Concerning for genital herpes Viral culture today Will start valtrex Discussed the importance of use protection to prevent spreading the virus  RTC as needed or if symptoms persist.

## 2013-09-22 NOTE — Progress Notes (Signed)
Pre visit review using our clinic review tool, if applicable. No additional management support is needed unless otherwise documented below in the visit note. 

## 2013-09-25 ENCOUNTER — Telehealth: Payer: Self-pay

## 2013-09-25 NOTE — Telephone Encounter (Signed)
[name] with [facility] request source of culture from 09/22/13. Advised dx vaginal lesions. [name] voiced understanding.

## 2013-09-28 LAB — VIRAL CULTURE VIRC: ORGANISM ID, BACTERIA: NEGATIVE

## 2014-04-19 ENCOUNTER — Other Ambulatory Visit (HOSPITAL_COMMUNITY)
Admission: RE | Admit: 2014-04-19 | Discharge: 2014-04-19 | Disposition: A | Discharge status: Home / Self Care | Payer: BC Managed Care – PPO | Source: Ambulatory Visit | Attending: Family Medicine | Admitting: Family Medicine

## 2014-04-19 ENCOUNTER — Encounter (INDEPENDENT_AMBULATORY_CARE_PROVIDER_SITE_OTHER): Payer: Self-pay

## 2014-04-19 ENCOUNTER — Ambulatory Visit (INDEPENDENT_AMBULATORY_CARE_PROVIDER_SITE_OTHER): Payer: BC Managed Care – PPO | Admitting: Family Medicine

## 2014-04-19 VITALS — BP 112/72 | HR 62 | Temp 98.2°F | Ht 63.5 in | Wt 145.8 lb

## 2014-04-19 DIAGNOSIS — Z1151 Encounter for screening for human papillomavirus (HPV): Secondary | ICD-10-CM | POA: Diagnosis present

## 2014-04-19 DIAGNOSIS — Z01419 Encounter for gynecological examination (general) (routine) without abnormal findings: Secondary | ICD-10-CM

## 2014-04-19 DIAGNOSIS — Z3041 Encounter for surveillance of contraceptive pills: Secondary | ICD-10-CM

## 2014-04-19 LAB — COMPREHENSIVE METABOLIC PANEL
ALK PHOS: 62 U/L (ref 39–117)
ALT: 12 U/L (ref 0–35)
AST: 18 U/L (ref 0–37)
Albumin: 3.8 g/dL (ref 3.5–5.2)
BILIRUBIN TOTAL: 0.6 mg/dL (ref 0.2–1.2)
BUN: 8 mg/dL (ref 6–23)
CO2: 23 meq/L (ref 19–32)
CREATININE: 0.8 mg/dL (ref 0.4–1.2)
Calcium: 8.9 mg/dL (ref 8.4–10.5)
Chloride: 105 mEq/L (ref 96–112)
GFR: 102.71 mL/min (ref >60.00)
Glucose, Bld: 85 mg/dL (ref 70–99)
Potassium: 3.7 mEq/L (ref 3.5–5.1)
SODIUM: 136 meq/L (ref 135–145)
TOTAL PROTEIN: 7.2 g/dL (ref 6.0–8.3)

## 2014-04-19 LAB — CBC WITH DIFFERENTIAL/PLATELET
BASOS ABS: 0 10*3/uL (ref 0.0–0.1)
Basophils Relative: 0.4 % (ref 0.0–3.0)
EOS ABS: 0 10*3/uL (ref 0.0–0.7)
Eosinophils Relative: 0.6 % (ref 0.0–5.0)
HEMATOCRIT: 31.2 % — AB (ref 36.0–46.0)
Hemoglobin: 9.9 g/dL — ABNORMAL LOW (ref 12.0–15.0)
LYMPHS ABS: 2.5 10*3/uL (ref 0.7–4.0)
Lymphocytes Relative: 39.7 % (ref 12.0–46.0)
MCHC: 31.8 g/dL (ref 30.0–36.0)
MCV: 71.5 fl — ABNORMAL LOW (ref 78.0–100.0)
MONO ABS: 0.3 10*3/uL (ref 0.1–1.0)
MONOS PCT: 5.3 % (ref 3.0–12.0)
NEUTROS ABS: 3.4 10*3/uL (ref 1.4–7.7)
Neutrophils Relative %: 54 % (ref 43.0–77.0)
PLATELETS: 311 10*3/uL (ref 150.0–400.0)
RBC: 4.36 Mil/uL (ref 3.87–5.11)
RDW: 17.9 % — AB (ref 11.5–15.5)
WBC: 6.3 10*3/uL (ref 4.0–10.5)

## 2014-04-19 LAB — LIPID PANEL
CHOL/HDL RATIO: 3
Cholesterol: 227 mg/dL — ABNORMAL HIGH (ref 0–200)
HDL: 68.7 mg/dL (ref >39.00)
LDL CALC: 126 mg/dL — AB (ref 0–99)
NONHDL: 158.3
Triglycerides: 161 mg/dL — ABNORMAL HIGH (ref 0.0–149.0)
VLDL: 32.2 mg/dL (ref 0.0–40.0)

## 2014-04-19 LAB — TSH: TSH: 1.08 u[IU]/mL (ref 0.35–4.50)

## 2014-04-19 NOTE — Progress Notes (Signed)
Subjective:    Patient ID: Natalie Wu, female    DOB: 1991-04-27, 23 y.o.   MRN: [id number]  HPI 23 yo very pleasant female here for CPX.  Doing well.  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  She loves it. Called off her engagement but happy in her new relationship. Sexually active with her boyfriend only.  Has never had a pap smear.  H/o iron deficiency anemia- was taking oral  iron 325 mg daily but often forgets to take it.   Was referred to hematology, Dr. Francee PiccoloFennigan and work up was negative.  Twin sister was referred for same issue and has had issues with iron deficiency anemia since she was a small child.   Has had two receive two courses of IV iron, most recently October 2011.  Denies any fatigue, SOB, DOE.   Lab Results  Component Value Date   WBC 8.4 08/04/2013   HGB 11.2 08/04/2013   HCT 34.8 08/04/2013   MCV 78* 08/04/2013   PLT 247.0 08/10/2012    On Sprintec to control her periods, to prevent heavy bleeding from worsening her anemia although she was diagnosed with anemia prior to menstruating  Lab Results  Component Value Date   CHOL 205* 08/10/2012   HDL 64.70 08/10/2012   LDLDIRECT 120.5 08/10/2012   TRIG 134.0 08/10/2012   CHOLHDL 3 08/10/2012    Review of Systems  Constitutional: Positive for fatigue.  HENT: Negative.   Eyes: Negative.   Respiratory: Negative.   Cardiovascular: Negative.   Gastrointestinal: Negative.   Endocrine: Negative.   Genitourinary: Negative.   Musculoskeletal: Negative.   Allergic/Immunologic: Negative.   Neurological: Negative.   Hematological: Negative.   Psychiatric/Behavioral: Negative.    See HPI     Objective:   Physical Exam BP 112/72 mmHg  Pulse 62  Temp(Src) 98.2 F (36.8 C) (Oral)  Ht 5' 3.5" (1.613 m)  Wt 145 lb 12 oz (66.112 kg)  BMI 25.41 kg/m2  SpO2 98%  LMP 03/26/2014 (Within Weeks)   General:  Well-developed,well-nourished,in no acute distress; alert,appropriate and cooperative throughout  examination Head:  normocephalic and atraumatic.   Eyes:  vision grossly intact, pupils equal, pupils round, and pupils reactive to light.   Ears:  R ear normal and L ear normal.   Nose:  no external deformity.   Mouth:  good dentition.   Neck:  No deformities, masses, or tenderness noted. Breasts:  No mass, nodules, thickening, tenderness, bulging, retraction, inflamation, nipple discharge or skin changes noted.   Lungs:  Normal respiratory effort, chest expands symmetrically. Lungs are clear to auscultation, no crackles or wheezes. Heart:  Normal rate and regular rhythm. S1 and S2 normal without gallop, murmur, click, rub or other extra sounds. Abdomen:  Bowel sounds positive,abdomen soft and non-tender without masses, organomegaly or hernias noted. Rectal:  no external abnormalities.   Genitalia:  Pelvic Exam:        External: normal female genitalia without lesions or masses        Vagina: normal without lesions or masses        Cervix: normal without lesions or masses        Adnexa: normal bimanual exam without masses or fullness        Uterus: normal by palpation        Pap smear: performed Msk:  No deformity or scoliosis noted of thoracic or lumbar spine.   Extremities:  No clubbing, cyanosis, edema, or deformity noted with normal full range of motion  of all joints.   Neurologic:  alert & oriented X3 and gait normal.   Skin:  Intact without suspicious lesions or rashes Cervical Nodes:  No lymphadenopathy noted Axillary Nodes:  No palpable lymphadenopathy Psych:  Cognition and judgment appear intact. Alert and cooperative with normal attention span and concentration. No apparent delusions, illusions, hallucinations     Assessment & Plan:

## 2014-04-19 NOTE — Progress Notes (Signed)
Pre visit review using our clinic review tool, if applicable. No additional management support is needed unless otherwise documented below in the visit note. 

## 2014-04-19 NOTE — Addendum Note (Signed)
Addended by: Desmond DikeKNIGHT, Devinn Hurwitz H on: 04/19/2014 12:35 PM   Modules accepted: Orders

## 2014-04-19 NOTE — Assessment & Plan Note (Signed)
Reviewed preventive care protocols, scheduled due services, and updated immunizations Discussed nutrition, exercise, diet, and healthy lifestyle.  Pap smear today.  Labs ordered.  Orders Placed This Encounter  Procedures  . CBC with Differential  . Comprehensive metabolic panel  . Lipid panel  . TSH

## 2014-04-19 NOTE — Patient Instructions (Signed)
Great to see you. Happy holidays! We will call you with your results.

## 2014-04-20 ENCOUNTER — Encounter: Payer: Self-pay | Admitting: *Deleted

## 2014-04-20 ENCOUNTER — Ambulatory Visit: Payer: Self-pay | Admitting: Podiatry

## 2014-04-20 LAB — CYTOLOGY - PAP

## 2014-04-23 ENCOUNTER — Other Ambulatory Visit: Payer: Self-pay | Admitting: Family Medicine
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
## 2014-06-01 NOTE — L&D Delivery Note (Signed)
Obstetrical Delivery Note   Date of Delivery:   05/11/2015 Primary OB:   [facility] Gestational Age/EDD: [redacted]w[redacted]d Antepartum complications: h/o chlamydia with negative test of cure, GBS pos  Delivered By:   Durene Romans MD  Delivery Type:   spontaneous vaginal delivery  Delivery Details:   Patient easily delivered infant in LOA position. Delayed cord clamping and cord cut by father of the baby Anesthesia:    epidural Intrapartum complications: None GBS:    Positive Laceration:    2nd degree repaired with 2-0 vicryl Episiotomy:    none Placenta:    Delivered and expressed via active management. Intact: yes. To pathology: no.  Estimated Blood Loss:  374mL  Baby:    Liveborn female, APGARs 7/9, weight 3490 gm  Durene Romans. MD [facility] Pager [phone]

## 2014-07-23 ENCOUNTER — Other Ambulatory Visit: Payer: Self-pay | Admitting: Family Medicine

## 2014-08-03 ENCOUNTER — Ambulatory Visit (INDEPENDENT_AMBULATORY_CARE_PROVIDER_SITE_OTHER): Payer: BLUE CROSS/BLUE SHIELD

## 2014-08-03 ENCOUNTER — Ambulatory Visit (INDEPENDENT_AMBULATORY_CARE_PROVIDER_SITE_OTHER): Payer: BC Managed Care – PPO | Admitting: Podiatry

## 2014-08-03 DIAGNOSIS — M79671 Pain in right foot: Secondary | ICD-10-CM | POA: Diagnosis not present

## 2014-08-03 DIAGNOSIS — T148XXA Other injury of unspecified body region, initial encounter: Secondary | ICD-10-CM

## 2014-08-03 DIAGNOSIS — T148 Other injury of unspecified body region: Secondary | ICD-10-CM | POA: Diagnosis not present

## 2014-08-04 NOTE — Progress Notes (Signed)
Subjective:     Patient ID: Natalie Wu, female   DOB: 1991-05-01, 24 y.o.   MRN: [id number]  HPI patient is noted to have abrasion on the right incision site where we had done previous sesamoidectomy and states it hurts and that the underlying part of her foot will hurt her occasionally   Review of Systems  All other systems reviewed and are negative.      Objective:   Physical Exam  Constitutional: She is oriented to person, place, and time.  Cardiovascular: Intact distal pulses.   Musculoskeletal: Normal range of motion.  Neurological: She is oriented to person, place, and time.  Skin: Skin is warm.  Nursing note and vitals reviewed.  neurovascular status intact with muscle strength adequate range of motion within normal limits and noted to have on the right incision site and area of abrasion within the middle that's localized with no proximal edema erythema or drainage noted and minimal discomfort when palpated plantarly     Assessment:     May have mild plantar disc comfort secondary to sesamoidal removal but seems to be done well with an abrasion of the right incision site from pedicure    Plan:     H&P performed debridement accomplished I flushed it and applied a small amount of Silvadene with dressing. It should heal uneventfully but patient will come in if any issues should occur

## 2014-09-17 ENCOUNTER — Ambulatory Visit (INDEPENDENT_AMBULATORY_CARE_PROVIDER_SITE_OTHER): Payer: BLUE CROSS/BLUE SHIELD | Admitting: Internal Medicine

## 2014-09-17 ENCOUNTER — Encounter: Payer: Self-pay | Admitting: Internal Medicine

## 2014-09-17 VITALS — BP 110/78 | HR 93 | Temp 98.8°F | Wt 131.0 lb

## 2014-09-17 DIAGNOSIS — Z3201 Encounter for pregnancy test, result positive: Secondary | ICD-10-CM | POA: Diagnosis not present

## 2014-09-17 DIAGNOSIS — Z3401 Encounter for supervision of normal first pregnancy, first trimester: Secondary | ICD-10-CM

## 2014-09-17 NOTE — Progress Notes (Signed)
Pre visit review using our clinic review tool, if applicable. No additional management support is needed unless otherwise documented below in the visit note. 

## 2014-09-17 NOTE — Progress Notes (Signed)
   Subjective:    Patient ID: [name], female    DOB: 03/18/1991, 24 y.o.   MRN: [id number]  HPI [name] is a 24 year old female who presents today with concerns that she is pregnant.  An in office pregnancy test was administered and test was positive for pregnancy.  She has previously taken 2 home pregnancy tests a week ago and they were positive as well.    Review of Systems  Constitutional: Negative for fever, chills and fatigue.  HENT: Negative.   Respiratory: Negative for cough, shortness of breath and wheezing.   Cardiovascular: Negative for chest pain, palpitations and leg swelling.  Gastrointestinal: Negative for nausea, vomiting, abdominal pain and abdominal distention.  Genitourinary: Negative for frequency.  Skin: Negative.   Psychiatric/Behavioral: Negative.        Objective:   Physical Exam  Constitutional: She is oriented to person, place, and time. She appears well-developed and well-nourished.  HENT:  Head: Normocephalic and atraumatic.  Neck: Normal range of motion. Neck supple.  Cardiovascular: Normal rate, regular rhythm and normal heart sounds.   Pulmonary/Chest: Effort normal and breath sounds normal.  Musculoskeletal: Normal range of motion.  Neurological: She is alert and oriented to person, place, and time.  Skin: Skin is warm and dry.          Assessment & Plan:  Patient was counseled on avoiding drugs and alcohol and limiting caffeine usage.  She has expressed that this is a wanted pregnancy and will make an appointment with her OB.  Patient was advised to begin taking prenatal vitamins today, she agreed and will get some today. Advised patient to avoid taking any medications without the advice of her OB.

## 2014-09-17 NOTE — Progress Notes (Signed)
Subjective:    Patient ID: [name], female    DOB: Sep 15, 1990, 24 y.o.   MRN: [id number]  HPI  Pt presents to the clinic today with concern for suspected pregnancy. She reports she took 2 test, 1 week ago, which were both positive. She reports she and her fiancee have been trying for the last year. Her LMP was 08/06/14. She is not currently taking any medications. She has seen a gyn in [place] a few years ago, and is considering going back there for her prenatal care. She does have some breast tenderness but denies nausea, vomiting, frequent urination, vaginal spotting or bleeding.  Review of Systems  No past medical history on file.  Current Outpatient Prescriptions  Medication Sig Dispense Refill  . Norgestimate-Ethinyl Estradiol Triphasic (TRI-SPRINTEC) 0.18/0.215/0.25 MG-35 MCG tablet TAKE ONE TABLET BY MOUTH ONE TIME DAILY 28 tablet 11  . valACYclovir (VALTREX) 1000 MG tablet Take 1 tablet (1,000 mg total) by mouth 2 (two) times daily. 20 tablet 0   No current facility-administered medications for this visit.    No Known Allergies  No family history on file.  History   Social History  . Marital Status: Single    Spouse Name: N/A  . Number of Children: N/A  . Years of Education: N/A   Occupational History  . Not on file.   Social History Main Topics  . Smoking status: Never Smoker   . Smokeless tobacco: Not on file  . Alcohol Use: No  . Drug Use: Not on file  . Sexual Activity: Not on file   Other Topics Concern  . Not on file   Social History Narrative     Constitutional: Denies fever, malaise, fatigue, headache or abrupt weight changes.  Respiratory: Denies difficulty breathing, shortness of breath, cough or sputum production.   Cardiovascular: Denies chest pain, chest tightness, palpitations or swelling in the hands or feet.  Gastrointestinal: Denies abdominal pain, bloating, constipation, diarrhea or blood in the stool.  GU: Denies urgency,  frequency, pain with urination, burning sensation, blood in urine, odor or discharge.   No other specific complaints in a complete review of systems (except as listed in HPI above).     Objective:   Physical Exam   BP 110/78 mmHg  Pulse 93  Temp(Src) 98.8 F (37.1 C) (Oral)  Wt 131 lb (59.421 kg)  SpO2 99%  LMP 08/07/2014 Wt Readings from Last 3 Encounters:  09/17/14 131 lb (59.421 kg)  09/22/13 114 lb 4 oz (51.823 kg)  08/15/13 114 lb 8 oz (51.937 kg)    General: Appears her stated age, well developed, well nourished in NAD. Cardiovascular: Normal rate and rhythm. S1,S2 noted.  No murmur, rubs or gallops noted.  Pulmonary/Chest: Normal effort and positive vesicular breath sounds. No respiratory distress. No wheezes, rales or ronchi noted.  Abdomen: Soft and nontender. Normal bowel sounds, no bruits noted. No distention or masses noted.  Neurological: Alert and oriented.   BMET    Component Value Date/Time   NA 137 08/11/2012 0918   K 3.9 08/11/2012 0918   CL 105 08/11/2012 0918   CO2 25 08/11/2012 0918   GLUCOSE 84 08/11/2012 0918   BUN 10 08/11/2012 0918   CREATININE 0.8 08/11/2012 0918   CALCIUM 8.9 08/11/2012 0918    Lipid Panel     Component Value Date/Time   CHOL 256* 08/11/2012 0918   TRIG 189.0* 08/11/2012 0918   HDL 63.50 08/11/2012 0918   CHOLHDL 4 08/11/2012 4315  VLDL 37.8 08/11/2012 0918    CBC    Component Value Date/Time   WBC 5.8 08/11/2012 0918   RBC 4.50 08/11/2012 0918   HGB 11.5* 08/11/2012 0918   HCT 34.8* 08/11/2012 0918   PLT 269.0 08/11/2012 0918   MCV 77.3* 08/11/2012 0918   MCHC 33.2 08/11/2012 0918   RDW 15.1* 08/11/2012 0918   LYMPHSABS 2.3 08/11/2012 0918   MONOABS 0.3 08/11/2012 0918   EOSABS 0.1 08/11/2012 0918   BASOSABS 0.0 08/11/2012 0918    Hgb A1C No results found for: HGBA1C      Assessment & Plan:   Supervision of normal pregnancy, first trimester:  Urine hcg: positive Will defer to Obstetrics for  further evaluation and treatment- she will call and make her own appt Discussed foods to avoid during pregnancy Discussed medications to avoid during pregnancy She will get prenatal vitamins from [employer]  RTC as needed

## 2014-09-17 NOTE — Patient Instructions (Signed)
First Trimester of Pregnancy The first trimester of pregnancy is from week 1 until the end of week 12 (months 1 through 3). A week after a sperm fertilizes an egg, the egg will implant on the wall of the uterus. This embryo will begin to develop into a baby. Genes from you and your partner are forming the baby. The female genes determine whether the baby is a boy or a girl. At 6-8 weeks, the eyes and face are formed, and the heartbeat can be seen on ultrasound. At the end of 12 weeks, all the baby's organs are formed.  Now that you are pregnant, you will want to do everything you can to have a healthy baby. Two of the most important things are to get good prenatal care and to follow your health care provider's instructions. Prenatal care is all the medical care you receive before the baby's birth. This care will help prevent, find, and treat any problems during the pregnancy and childbirth. BODY CHANGES Your body goes through many changes during pregnancy. The changes vary from woman to woman.   You may gain or lose a couple of pounds at first.  You may feel sick to your stomach (nauseous) and throw up (vomit). If the vomiting is uncontrollable, call your health care provider.  You may tire easily.  You may develop headaches that can be relieved by medicines approved by your health care provider.  You may urinate more often. Painful urination may mean you have a bladder infection.  You may develop heartburn as a result of your pregnancy.  You may develop constipation because certain hormones are causing the muscles that push waste through your intestines to slow down.  You may develop hemorrhoids or swollen, bulging veins (varicose veins).  Your breasts may begin to grow larger and become tender. Your nipples may stick out more, and the tissue that surrounds them (areola) may become darker.  Your gums may bleed and may be sensitive to brushing and flossing.  Dark spots or blotches (chloasma,  mask of pregnancy) may develop on your face. This will likely fade after the baby is born.  Your menstrual periods will stop.  You may have a loss of appetite.  You may develop cravings for certain kinds of food.  You may have changes in your emotions from day to day, such as being excited to be pregnant or being concerned that something may go wrong with the pregnancy and baby.  You may have more vivid and strange dreams.  You may have changes in your hair. These can include thickening of your hair, rapid growth, and changes in texture. Some women also have hair loss during or after pregnancy, or hair that feels dry or thin. Your hair will most likely return to normal after your baby is born. WHAT TO EXPECT AT YOUR PRENATAL VISITS During a routine prenatal visit:  You will be weighed to make sure you and the baby are growing normally.  Your blood pressure will be taken.  Your abdomen will be measured to track your baby's growth.  The fetal heartbeat will be listened to starting around week 10 or 12 of your pregnancy.  Test results from any previous visits will be discussed. Your health care provider may ask you:  How you are feeling.  If you are feeling the baby move.  If you have had any abnormal symptoms, such as leaking fluid, bleeding, severe headaches, or abdominal cramping.  If you have any questions. Other tests   that may be performed during your first trimester include:  Blood tests to find your blood type and to check for the presence of any previous infections. They will also be used to check for low iron levels (anemia) and Rh antibodies. Later in the pregnancy, blood tests for diabetes will be done along with other tests if problems develop.  Urine tests to check for infections, diabetes, or protein in the urine.  An ultrasound to confirm the proper growth and development of the baby.  An amniocentesis to check for possible genetic problems.  Fetal screens for  spina bifida and Down syndrome.  You may need other tests to make sure you and the baby are doing well. HOME CARE INSTRUCTIONS  Medicines  Follow your health care provider's instructions regarding medicine use. Specific medicines may be either safe or unsafe to take during pregnancy.  Take your prenatal vitamins as directed.  If you develop constipation, try taking a stool softener if your health care provider approves. Diet  Eat regular, well-balanced meals. Choose a variety of foods, such as meat or vegetable-based protein, fish, milk and low-fat dairy products, vegetables, fruits, and whole grain breads and cereals. Your health care provider will help you determine the amount of weight gain that is right for you.  Avoid raw meat and uncooked cheese. These carry germs that can cause birth defects in the baby.  Eating four or five small meals rather than three large meals a day may help relieve nausea and vomiting. If you start to feel nauseous, eating a few soda crackers can be helpful. Drinking liquids between meals instead of during meals also seems to help nausea and vomiting.  If you develop constipation, eat more high-fiber foods, such as fresh vegetables or fruit and whole grains. Drink enough fluids to keep your urine clear or pale yellow. Activity and Exercise  Exercise only as directed by your health care provider. Exercising will help you:  Control your weight.  Stay in shape.  Be prepared for labor and delivery.  Experiencing pain or cramping in the lower abdomen or low back is a good sign that you should stop exercising. Check with your health care provider before continuing normal exercises.  Try to avoid standing for long periods of time. Move your legs often if you must stand in one place for a long time.  Avoid heavy lifting.  Wear low-heeled shoes, and practice good posture.  You may continue to have sex unless your health care provider directs you  otherwise. Relief of Pain or Discomfort  Wear a good support bra for breast tenderness.   Take warm sitz baths to soothe any pain or discomfort caused by hemorrhoids. Use hemorrhoid cream if your health care provider approves.   Rest with your legs elevated if you have leg cramps or low back pain.  If you develop varicose veins in your legs, wear support hose. Elevate your feet for 15 minutes, 3-4 times a day. Limit salt in your diet. Prenatal Care  Schedule your prenatal visits by the twelfth week of pregnancy. They are usually scheduled monthly at first, then more often in the last 2 months before delivery.  Write down your questions. Take them to your prenatal visits.  Keep all your prenatal visits as directed by your health care provider. Safety  Wear your seat belt at all times when driving.  Make a list of emergency phone numbers, including numbers for family, friends, the hospital, and police and fire departments. General Tips    Ask your health care provider for a referral to a local prenatal education class. Begin classes no later than at the beginning of month 6 of your pregnancy.  Ask for help if you have counseling or nutritional needs during pregnancy. Your health care provider can offer advice or refer you to specialists for help with various needs.  Do not use hot tubs, steam rooms, or saunas.  Do not douche or use tampons or scented sanitary pads.  Do not cross your legs for long periods of time.  Avoid cat litter boxes and soil used by cats. These carry germs that can cause birth defects in the baby and possibly loss of the fetus by miscarriage or stillbirth.  Avoid all smoking, herbs, alcohol, and medicines not prescribed by your health care provider. Chemicals in these affect the formation and growth of the baby.  Schedule a dentist appointment. At home, brush your teeth with a soft toothbrush and be gentle when you floss. SEEK MEDICAL CARE IF:   You have  dizziness.  You have mild pelvic cramps, pelvic pressure, or nagging pain in the abdominal area.  You have persistent nausea, vomiting, or diarrhea.  You have a bad smelling vaginal discharge.  You have pain with urination.  You notice increased swelling in your face, hands, legs, or ankles. SEEK IMMEDIATE MEDICAL CARE IF:   You have a fever.  You are leaking fluid from your vagina.  You have spotting or bleeding from your vagina.  You have severe abdominal cramping or pain.  You have rapid weight gain or loss.  You vomit blood or material that looks like coffee grounds.  You are exposed to German measles and have never had them.  You are exposed to fifth disease or chickenpox.  You develop a severe headache.  You have shortness of breath.  You have any kind of trauma, such as from a fall or a car accident. Document Released: 05/12/2001 Document Revised: 10/02/2013 Document Reviewed: 03/28/2013 [employer] Patient Information 2015 [employer]. This information is not intended to replace advice given to you by your health care provider. Make sure you discuss any questions you have with your health care provider.  

## 2014-10-25 ENCOUNTER — Ambulatory Visit (INDEPENDENT_AMBULATORY_CARE_PROVIDER_SITE_OTHER): Payer: BLUE CROSS/BLUE SHIELD

## 2014-10-25 ENCOUNTER — Ambulatory Visit (INDEPENDENT_AMBULATORY_CARE_PROVIDER_SITE_OTHER): Payer: BLUE CROSS/BLUE SHIELD | Admitting: Podiatry

## 2014-10-25 DIAGNOSIS — M779 Enthesopathy, unspecified: Secondary | ICD-10-CM | POA: Diagnosis not present

## 2014-10-25 MED ORDER — DICLOFENAC SODIUM 75 MG PO TBEC: 75.0000 mg | DELAYED_RELEASE_TABLET | Freq: Two times a day (BID) | ORAL | Status: DC

## 2014-10-25 NOTE — Progress Notes (Signed)
   Subjective:    Patient ID: Natalie JunesKelly L Crisanti, female    DOB: 01/17/1991, 24 y.o.   MRN: [id number]  HPI  Pt presents with pain in her right foot, starts on the lateral side of her upper ankle and radiates down to her heel causing pain and numbness in her toes, she states that it keeps her up at night, pain has been ongoing for 1 week. She has tried NSAIDS, with no relief  Review of Systems  All other systems reviewed and are negative.      Objective:   Physical Exam        Assessment & Plan:

## 2014-10-26 NOTE — Progress Notes (Signed)
Subjective:     Patient ID: Natalie Wu, female   DOB: April 18, 1991, 24 y.o.   MRN: [id number]  HPI patient states the outside of my right ankle has been bothering me and I'm not sure of a history of injury. pResents with mother   Review of Systems     Objective:   Physical Exam Neurovascular status intact muscle strength was adequate with discomfort in the lateral side of the right foot and lower leg with no acute areas of discomfort or fluid buildup. Appears to be localized in nature    Assessment:     Most likely inflammatory in nature    Plan:     Reviewed x-rays and instructed on heat and ice therapy and dispensed a brace to support the arch. Reappoint to recheck

## 2014-11-06 ENCOUNTER — Encounter: Payer: Self-pay | Admitting: Family Medicine

## 2014-11-06 ENCOUNTER — Ambulatory Visit (INDEPENDENT_AMBULATORY_CARE_PROVIDER_SITE_OTHER): Payer: BLUE CROSS/BLUE SHIELD | Admitting: Family Medicine

## 2014-11-06 VITALS — BP 112/72 | HR 70 | Temp 98.0°F | Ht 63.5 in | Wt 150.2 lb

## 2014-11-06 DIAGNOSIS — Z01419 Encounter for gynecological examination (general) (routine) without abnormal findings: Secondary | ICD-10-CM | POA: Diagnosis not present

## 2014-11-06 DIAGNOSIS — Z3041 Encounter for surveillance of contraceptive pills: Secondary | ICD-10-CM | POA: Diagnosis not present

## 2014-11-06 DIAGNOSIS — Z862 Personal history of diseases of the blood and blood-forming organs and certain disorders involving the immune mechanism: Secondary | ICD-10-CM | POA: Diagnosis not present

## 2014-11-06 DIAGNOSIS — Z23 Encounter for immunization: Secondary | ICD-10-CM

## 2014-11-06 DIAGNOSIS — Z114 Encounter for screening for human immunodeficiency virus [HIV]: Secondary | ICD-10-CM

## 2014-11-06 DIAGNOSIS — K5900 Constipation, unspecified: Secondary | ICD-10-CM | POA: Insufficient documentation

## 2014-11-06 LAB — CBC
HEMATOCRIT: 31 % — AB (ref 36.0–46.0)
Hemoglobin: 9.8 g/dL — ABNORMAL LOW (ref 12.0–15.0)
MCHC: 31.8 g/dL (ref 30.0–36.0)
MCV: 71.6 fl — ABNORMAL LOW (ref 78.0–100.0)
Platelets: 284 10*3/uL (ref 150.0–400.0)
RBC: 4.32 Mil/uL (ref 3.87–5.11)
RDW: 17.4 % — ABNORMAL HIGH (ref 11.5–15.5)
WBC: 6.3 10*3/uL (ref 4.0–10.5)

## 2014-11-06 NOTE — Progress Notes (Signed)
Subjective:    Patient ID: Natalie Wu, female    DOB: 1990-08-19, 24 y.o.   MRN: [id number]  HPI 24 yo very pleasant female here for CPX.  Last pap smear done by me on 04/18/2014- normal.  Doing well.  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  Sexually active with her boyfriend only.    H/o iron deficiency anemia- was taking oral  iron 325 mg daily but often forgets to take it and she thinks it is causing constipation.  Last month or two- now only having a BM every few days.  It is not firm or painful when she has one.  Has tried miralax a few times.  No dark or blood stools.     Was referred to hematology, Dr. Francee Piccolo and work up was negative.  Twin sister was referred for same issue and has had issues with iron deficiency anemia since she was a small child.   Has had two receive two courses of IV iron, most recently October 2011.  Denies any fatigue, SOB, DOE.   Lab Results  Component Value Date   WBC 6.3 04/19/2014   HGB 9.9* 04/19/2014   HCT 31.2* 04/19/2014   MCV 71.5* 04/19/2014   PLT 311.0 04/19/2014    Lab Results  Component Value Date   TSH 1.08 04/19/2014    On Sprintec to control her periods, to prevent heavy bleeding from worsening her anemia although she was diagnosed with anemia prior to menstruating  Constipation-   Lab Results  Component Value Date   CHOL 227* 04/19/2014   HDL 68.70 04/19/2014   LDLCALC 126* 04/19/2014   LDLDIRECT 120.5 08/10/2012   TRIG 161.0* 04/19/2014   CHOLHDL 3 04/19/2014    Review of Systems  Constitutional: Positive for fatigue.  HENT: Negative.   Eyes: Negative.   Respiratory: Negative.   Cardiovascular: Negative.   Gastrointestinal: Positive for diarrhea and constipation. Negative for nausea, vomiting, abdominal pain, abdominal distention and rectal pain.  Endocrine: Negative.   Genitourinary: Negative.   Musculoskeletal: Negative.   Allergic/Immunologic: Negative.   Neurological: Negative.    Hematological: Negative.   Psychiatric/Behavioral: Negative.    See HPI     Objective:   Physical Exam BP 112/72 mmHg  Pulse 70  Temp(Src) 98 F (36.7 C) (Oral)  Ht 5' 3.5" (1.613 m)  Wt 150 lb 4 oz (68.153 kg)  BMI 26.19 kg/m2  SpO2 97%  LMP 10/14/2014 (Within Days)   General:  Well-developed,well-nourished,in no acute distress; alert,appropriate and cooperative throughout examination Head:  normocephalic and atraumatic.   Eyes:  vision grossly intact, pupils equal, pupils round, and pupils reactive to light.   Ears:  R ear normal and L ear normal.   Nose:  no external deformity.   Mouth:  good dentition.   Neck:  No deformities, masses, or tenderness noted. Lungs:  Normal respiratory effort, chest expands symmetrically. Lungs are clear to auscultation, no crackles or wheezes. Heart:  Normal rate and regular rhythm. S1 and S2 normal without gallop, murmur, click, rub or other extra sounds. Abdomen:  Bowel sounds positive,abdomen soft and non-tender without masses, organomegaly or hernias noted. Msk:  No deformity or scoliosis noted of thoracic or lumbar spine.   Extremities:  No clubbing, cyanosis, edema, or deformity noted with normal full range of motion of all joints.   Neurologic:  alert & oriented X3 and gait normal.   Skin:  Intact without suspicious lesions or rashes Cervical Nodes:  No lymphadenopathy noted Axillary Nodes:  No palpable lymphadenopathy Psych:  Cognition and judgment appear intact. Alert and cooperative with normal attention span and concentration. No apparent delusions, illusions, hallucinations     Assessment & Plan:

## 2014-11-06 NOTE — Progress Notes (Signed)
Pre visit review using our clinic review tool, if applicable. No additional management support is needed unless otherwise documented below in the visit note. 

## 2014-11-06 NOTE — Assessment & Plan Note (Signed)
Discussed dangers of smoking, alcohol, and drug abuse.  Also discussed sexual activity, pregnancy risk, and STD risk.  Encouraged to get regular exercise.  Orders Placed This Encounter  Procedures  . HIV antibody (with reflex)  . CBC   Tdap today.

## 2014-11-06 NOTE — Assessment & Plan Note (Signed)
Due for CBC. Recheck today.

## 2014-11-06 NOTE — Patient Instructions (Signed)
Good to see you. We will call you with your lab results and you can view them online.  Drink more water, ok to keep taking Miralax.  Congratulations on the new baby coming!

## 2014-11-06 NOTE — Assessment & Plan Note (Signed)
Pleased with current OCPs.  No changes made today.

## 2014-11-06 NOTE — Assessment & Plan Note (Signed)
Advised increasing water and miralax as needed.  Since still not truly firm or painful, may just be change in her bowel habits and not necessarily constipation. The patient indicates understanding of these issues and agrees with the plan.  Call or return to clinic prn if these symptoms worsen or fail to improve as anticipated.

## 2014-11-06 NOTE — Addendum Note (Signed)
Addended by: Desmond DikeKNIGHT, Oree Mirelez H on: 11/06/2014 09:58 AM   Modules accepted: Orders

## 2014-11-07 ENCOUNTER — Encounter: Payer: Self-pay | Admitting: Family Medicine

## 2014-11-07 ENCOUNTER — Ambulatory Visit (INDEPENDENT_AMBULATORY_CARE_PROVIDER_SITE_OTHER): Payer: BLUE CROSS/BLUE SHIELD | Admitting: Family Medicine

## 2014-11-07 ENCOUNTER — Other Ambulatory Visit: Payer: Self-pay | Admitting: *Deleted

## 2014-11-07 VITALS — BP 98/62 | HR 77 | Temp 97.7°F | Ht 63.25 in | Wt 127.0 lb

## 2014-11-07 DIAGNOSIS — D649 Anemia, unspecified: Secondary | ICD-10-CM

## 2014-11-07 DIAGNOSIS — Z0000 Encounter for general adult medical examination without abnormal findings: Secondary | ICD-10-CM | POA: Diagnosis not present

## 2014-11-07 DIAGNOSIS — Z01419 Encounter for gynecological examination (general) (routine) without abnormal findings: Secondary | ICD-10-CM | POA: Insufficient documentation

## 2014-11-07 DIAGNOSIS — Z331 Pregnant state, incidental: Secondary | ICD-10-CM | POA: Diagnosis not present

## 2014-11-07 DIAGNOSIS — Z862 Personal history of diseases of the blood and blood-forming organs and certain disorders involving the immune mechanism: Secondary | ICD-10-CM | POA: Diagnosis not present

## 2014-11-07 DIAGNOSIS — Z3490 Encounter for supervision of normal pregnancy, unspecified, unspecified trimester: Secondary | ICD-10-CM

## 2014-11-07 LAB — HIV ANTIBODY (ROUTINE TESTING W REFLEX): HIV: NONREACTIVE

## 2014-11-07 MED ORDER — FERROUS SULFATE 325 (65 FE) MG PO TABS: 325.0000 mg | ORAL_TABLET | Freq: Every day | ORAL | Status: DC

## 2014-11-07 NOTE — Progress Notes (Signed)
   Subjective:   Patient ID: [name], female    DOB: 1991/02/14, 24 y.o.   MRN: [id number]  [name] is a pleasant 24 y.o. year old female who presents to clinic today with Annual Exam  on 11/07/2014  HPI:  G1- [redacted] weeks pregnant.  Doing well- just had her OBGYN appt yesterday- cannot remember name of her OB. Said that "they heard a heart beat and took blood."  She is no longer feeling nauseated like she did during the first trimester.  Less fatigued and breast tenderness has resolved. No vaginal bleeding or cramping.  OB is prescribing iron for her anemia.  She and fiance and very excited about the baby.  Lab Results  Component Value Date   CHOL 256* 08/11/2012   HDL 63.50 08/11/2012   LDLDIRECT 175.2 08/11/2012   TRIG 189.0* 08/11/2012   CHOLHDL 4 08/11/2012   No results found for: TSH Lab Results  Component Value Date   CREATININE 0.8 08/11/2012   No current outpatient prescriptions on file prior to visit.   No current facility-administered medications on file prior to visit.    No Known Allergies  History reviewed. No pertinent past medical history.  History reviewed. No pertinent past surgical history.  History reviewed. No pertinent family history.  History   Social History  . Marital Status: Single    Spouse Name: N/A  . Number of Children: N/A  . Years of Education: N/A   Occupational History  . Not on file.   Social History Main Topics  . Smoking status: Never Smoker   . Smokeless tobacco: Not on file  . Alcohol Use: No  . Drug Use: Not on file  . Sexual Activity: Not on file   Other Topics Concern  . Not on file   Social History Narrative   The PMH, PSH, Social History, Family History, Medications, and allergies have been reviewed in [facility], and have been updated if relevant.  Review of Systems  Constitutional: Negative.   HENT: Negative.   Respiratory: Negative.   Cardiovascular: Negative.   Gastrointestinal: Negative.     Endocrine: Negative.   Genitourinary: Negative.   Musculoskeletal: Negative.   Skin: Negative.   Allergic/Immunologic: Negative.   Neurological: Negative.   Hematological: Negative.   Psychiatric/Behavioral: Negative.   All other systems reviewed and are negative.      Objective:    BP 98/62 mmHg  Pulse 77  Temp(Src) 97.7 F (36.5 C) (Oral)  Ht 5' 3.25" (1.607 m)  Wt 127 lb (57.607 kg)  BMI 22.31 kg/m2  SpO2 98%  LMP 08/07/2014   Physical Exam  Constitutional: She is oriented to person, place, and time. She appears well-developed and well-nourished. No distress.  HENT:  Head: Normocephalic and atraumatic.  Eyes: Conjunctivae are normal.  Neck: Normal range of motion. Neck supple. No thyromegaly present.  Cardiovascular: Normal rate and regular rhythm.   Pulmonary/Chest: Effort normal and breath sounds normal. No respiratory distress. She has no wheezes.  Musculoskeletal: Normal range of motion. She exhibits no edema.  Neurological: She is alert and oriented to person, place, and time. No cranial nerve deficit.  Skin: Skin is warm and dry.  Psychiatric: She has a normal mood and affect. Her behavior is normal. Judgment and thought content normal.  Nursing note and vitals reviewed.         Assessment & Plan:   Well woman exam  Pregnant No Follow-up on file.

## 2014-11-07 NOTE — Assessment & Plan Note (Signed)
Reviewed preventive care protocols, scheduled due services, and updated immunizations Discussed nutrition, exercise, diet, and healthy lifestyle.  She is declining labs since had blood work done at [facility].  Will request records. The patient indicates understanding of these issues and agrees with the plan.

## 2014-11-07 NOTE — Progress Notes (Signed)
Pre visit review using our clinic review tool, if applicable. No additional management support is needed unless otherwise documented below in the visit note. 

## 2015-02-06 ENCOUNTER — Other Ambulatory Visit: Payer: Self-pay

## 2015-02-06 MED ORDER — NORGESTIM-ETH ESTRAD TRIPHASIC 0.18/0.215/0.25 MG-35 MCG PO TABS: 1.0000 | ORAL_TABLET | Freq: Every day | ORAL | Status: DC

## 2015-02-06 NOTE — Telephone Encounter (Signed)
[facility] left v/m requesting 90 day refill BC. Pt last annual 10/2014. Refill done per protocol.

## 2015-03-20 ENCOUNTER — Inpatient Hospital Stay: Payer: BLUE CROSS/BLUE SHIELD | Attending: Oncology | Admitting: Oncology

## 2015-03-20 ENCOUNTER — Encounter: Payer: Self-pay | Admitting: Oncology

## 2015-03-20 VITALS — BP 103/71 | HR 97 | Temp 98.9°F | Resp 16 | Wt 144.1 lb

## 2015-03-20 DIAGNOSIS — Z79899 Other long term (current) drug therapy: Secondary | ICD-10-CM | POA: Insufficient documentation

## 2015-03-20 DIAGNOSIS — Z85828 Personal history of other malignant neoplasm of skin: Secondary | ICD-10-CM | POA: Insufficient documentation

## 2015-03-20 DIAGNOSIS — O99013 Anemia complicating pregnancy, third trimester: Secondary | ICD-10-CM | POA: Insufficient documentation

## 2015-03-20 DIAGNOSIS — D509 Iron deficiency anemia, unspecified: Secondary | ICD-10-CM

## 2015-03-20 NOTE — Progress Notes (Signed)
Patient states she will get flu shot next week at her OB GYN. States she received DTap last week. Patient ranked 5/10 on distress for finances but states she did not need a call and that she "was handling it."

## 2015-03-25 NOTE — Progress Notes (Unsigned)
PSN left patient a voice mail message asking her to return the call if she had any financial concerns.

## 2015-03-26 ENCOUNTER — Inpatient Hospital Stay: Payer: BLUE CROSS/BLUE SHIELD

## 2015-03-26 VITALS — BP 117/80 | HR 96 | Temp 98.0°F | Resp 18

## 2015-03-26 DIAGNOSIS — Z862 Personal history of diseases of the blood and blood-forming organs and certain disorders involving the immune mechanism: Secondary | ICD-10-CM

## 2015-03-26 DIAGNOSIS — O99013 Anemia complicating pregnancy, third trimester: Secondary | ICD-10-CM | POA: Diagnosis not present

## 2015-03-26 MED ORDER — SODIUM CHLORIDE 0.9 % IJ SOLN
10.0000 mL | INTRAMUSCULAR | Status: DC | PRN
  Filled 2015-03-26: qty 10

## 2015-03-26 MED ORDER — FERUMOXYTOL INJECTION 510 MG/17 ML
510.0000 mg | Freq: Once | INTRAVENOUS | Status: AC
  Administered 2015-03-26: 510 mg via INTRAVENOUS
  Filled 2015-03-26: qty 17

## 2015-03-26 MED ORDER — SODIUM CHLORIDE 0.9 % IV SOLN
Freq: Once | INTRAVENOUS | Status: AC
  Administered 2015-03-26: 20 mL/h via INTRAVENOUS
  Filled 2015-03-26: qty 1000

## 2015-04-02 ENCOUNTER — Inpatient Hospital Stay: Payer: BLUE CROSS/BLUE SHIELD | Attending: Oncology

## 2015-04-02 VITALS — BP 116/75 | HR 102 | Temp 97.0°F | Resp 18

## 2015-04-02 DIAGNOSIS — O99013 Anemia complicating pregnancy, third trimester: Secondary | ICD-10-CM | POA: Insufficient documentation

## 2015-04-02 DIAGNOSIS — Z79899 Other long term (current) drug therapy: Secondary | ICD-10-CM | POA: Insufficient documentation

## 2015-04-02 DIAGNOSIS — Z85828 Personal history of other malignant neoplasm of skin: Secondary | ICD-10-CM | POA: Diagnosis not present

## 2015-04-02 DIAGNOSIS — Z862 Personal history of diseases of the blood and blood-forming organs and certain disorders involving the immune mechanism: Secondary | ICD-10-CM

## 2015-04-02 MED ORDER — SODIUM CHLORIDE 0.9 % IV SOLN
Freq: Once | INTRAVENOUS | Status: AC
  Administered 2015-04-02: 15:00:00 via INTRAVENOUS
  Filled 2015-04-02: qty 1000

## 2015-04-02 MED ORDER — FERUMOXYTOL INJECTION 510 MG/17 ML
510.0000 mg | Freq: Once | INTRAVENOUS | Status: AC
  Administered 2015-04-02: 510 mg via INTRAVENOUS
  Filled 2015-04-02: qty 17

## 2015-04-03 NOTE — Progress Notes (Signed)
[place]  Telephone:(336) [phone] Fax:(336) [phone]  ID: Ellwood Handler OB: August 06, 1990  MR#: [id number]  HBZ#:[id number]  Patient Care Team: Lucille Passy, MD as PCP - General  CHIEF COMPLAINT:  Chief Complaint  Patient presents with  . Establish Care    INTERVAL HISTORY: Patient is a 24 year old female who is her third trimester who was found to have a declining hemoglobin and iron stores. She currently feels well and is asymptomatic. She does not complain of weakness or fatigue. She has no neurologic complaints. She denies any chest pain or shortness of breath. She denies any nausea, vomiting, constipation, or diarrhea. She has no melena or hematochezia. She has no urinary complaints. Patient feels at her baseline and offers no specific complaints today.  REVIEW OF SYSTEMS:   Review of Systems  Constitutional: Negative.  Negative for malaise/fatigue.  Respiratory: Negative.  Negative for shortness of breath.   Cardiovascular: Negative.  Negative for chest pain.  Gastrointestinal: Negative.  Negative for blood in stool and melena.  Musculoskeletal: Negative.   Neurological: Negative.  Negative for weakness.    As per HPI. Otherwise, a complete review of systems is negatve.  PAST MEDICAL HISTORY: Past Medical History  Diagnosis Date  . Skin cancer   . Anemia     PAST SURGICAL HISTORY: Past Surgical History  Procedure Laterality Date  . Tonsillectomy      age 45    FAMILY HISTORY: Sister with iron deficiency anemia, otherwise negative and noncontributory.     ADVANCED DIRECTIVES:    HEALTH MAINTENANCE: Social History  Substance Use Topics  . Smoking status: Never Smoker   . Smokeless tobacco: Not on file  . Alcohol Use: No     Colonoscopy:  PAP:  Bone density:  Lipid panel:  No Known Allergies  Current Outpatient Prescriptions  Medication Sig Dispense Refill  . Prenatal Vit-Fe Fumarate-FA (PRENATAL MULTIVITAMIN) TABS tablet Take 1  tablet by mouth daily at 12 noon.     No current facility-administered medications for this visit.    OBJECTIVE: Filed Vitals:   03/20/15 1001  BP: 103/71  Pulse: 97  Temp: 98.9 F (37.2 C)  Resp: 16     Body mass index is 25.31 kg/(m^2).    ECOG FS:0 - Asymptomatic  General: Well-developed, well-nourished, no acute distress. Eyes: Pink conjunctiva, anicteric sclera. HEENT: Normocephalic, moist mucous membranes, clear oropharnyx. Lungs: Clear to auscultation bilaterally. Heart: Regular rate and rhythm. No rubs, murmurs, or gallops. Abdomen: Appears appropriate for gestational age. Musculoskeletal: No edema, cyanosis, or clubbing. Neuro: Alert, answering all questions appropriately. Cranial nerves grossly intact. Skin: No rashes or petechiae noted. Psych: Normal affect. Lymphatics: No cervical, calvicular, axillary or inguinal LAD.   LAB RESULTS:  Lab Results  Component Value Date   NA 137 08/11/2012   K 3.9 08/11/2012   CL 105 08/11/2012   CO2 25 08/11/2012   GLUCOSE 84 08/11/2012   BUN 10 08/11/2012   CREATININE 0.8 08/11/2012   CALCIUM 8.9 08/11/2012   PROT 6.9 08/11/2012   ALBUMIN 3.3* 08/11/2012   AST 18 08/11/2012   ALT 13 08/11/2012   ALKPHOS 63 08/11/2012   BILITOT 0.5 08/11/2012    Lab Results  Component Value Date   WBC 5.8 08/11/2012   NEUTROABS 3.1 08/11/2012   HGB 11.5* 08/11/2012   HCT 34.8* 08/11/2012   MCV 77.3* 08/11/2012   PLT 269.0 08/11/2012     STUDIES: No results found.  ASSESSMENT: Iron deficiency anemia in pregnancy.  PLAN:    1. Iron deficiency anemia: Patient's hemoglobin and iron stores are significantly decreased and she will benefit from IV iron. Patient will return to clinic in 1 and 2 weeks to receive 510 mg IV Feraheme. She will then return to clinic on April 29, 2015, just prior to her due date, for repeat laboratory work, further evaluation and consideration of additional IV iron if necessary. If patient gives birth  prior to this appointment, will schedule follow-up 2-3 months postpartumly. If her hemoglobin does not respond to iron infusions, will commence a full anemia workup at that point.  Patient expressed understanding and was in agreement with this plan. She also understands that She can call clinic at any time with any questions, concerns, or complaints.    Lloyd Huger, MD   04/03/2015 1:01 PM

## 2015-04-23 ENCOUNTER — Encounter: Payer: Self-pay | Admitting: Family Medicine

## 2015-04-29 ENCOUNTER — Inpatient Hospital Stay: Payer: BLUE CROSS/BLUE SHIELD

## 2015-04-29 ENCOUNTER — Inpatient Hospital Stay (HOSPITAL_BASED_OUTPATIENT_CLINIC_OR_DEPARTMENT_OTHER): Payer: BLUE CROSS/BLUE SHIELD | Admitting: Oncology

## 2015-04-29 VITALS — BP 127/89 | HR 88 | Temp 97.2°F | Resp 16 | Wt 154.3 lb

## 2015-04-29 DIAGNOSIS — O99013 Anemia complicating pregnancy, third trimester: Secondary | ICD-10-CM

## 2015-04-29 DIAGNOSIS — Z79899 Other long term (current) drug therapy: Secondary | ICD-10-CM | POA: Diagnosis not present

## 2015-04-29 DIAGNOSIS — D509 Iron deficiency anemia, unspecified: Secondary | ICD-10-CM

## 2015-04-29 DIAGNOSIS — Z85828 Personal history of other malignant neoplasm of skin: Secondary | ICD-10-CM | POA: Diagnosis not present

## 2015-04-29 LAB — IRON AND TIBC
IRON: 65 ug/dL (ref 28–170)
Saturation Ratios: 13 % (ref 10.4–31.8)
TIBC: 516 ug/dL — ABNORMAL HIGH (ref 250–450)
UIBC: 451 ug/dL

## 2015-04-29 LAB — CBC WITH DIFFERENTIAL/PLATELET
Basophils Absolute: 0.1 10*3/uL (ref 0–0.1)
Basophils Relative: 1 %
EOS ABS: 0 10*3/uL (ref 0–0.7)
EOS PCT: 0 %
HCT: 36.7 % (ref 35.0–47.0)
Hemoglobin: 12.1 g/dL (ref 12.0–16.0)
LYMPHS ABS: 1.6 10*3/uL (ref 1.0–3.6)
LYMPHS PCT: 16 %
MCH: 25.9 pg — AB (ref 26.0–34.0)
MCHC: 32.9 g/dL (ref 32.0–36.0)
MCV: 78.8 fL — AB (ref 80.0–100.0)
MONO ABS: 0.4 10*3/uL (ref 0.2–0.9)
MONOS PCT: 4 %
Neutro Abs: 8.2 10*3/uL — ABNORMAL HIGH (ref 1.4–6.5)
Neutrophils Relative %: 79 %
PLATELETS: 209 10*3/uL (ref 150–440)
RBC: 4.66 MIL/uL (ref 3.80–5.20)
RDW: 36.4 % — AB (ref 11.5–14.5)
WBC: 10.2 10*3/uL (ref 3.6–11.0)

## 2015-04-29 LAB — FERRITIN: Ferritin: 46 ng/mL (ref 11–307)

## 2015-04-29 NOTE — Progress Notes (Signed)
Patient feels that the iron infusions have slightly helped with her energy.  Her due date is 05/13/15.

## 2015-05-05 NOTE — Progress Notes (Signed)
[place]  Telephone:(336) [phone] Fax:(336) [phone]  ID: [name] OB: January 29, 1991  MR#: [id number]  [id number]  Patient Care Team: Lucille Passy, MD as PCP - General  CHIEF COMPLAINT:  Chief Complaint  Patient presents with  . Anemia    INTERVAL HISTORY: Patient returns to clinic today for repeat laboratory work and further evaluation. Patient states she has noticed the difference and her energy levels since receiving IV iron recently. She currently feels well and is asymptomatic. She does not complain of weakness or fatigue. She has no neurologic complaints. She denies any chest pain or shortness of breath. She denies any nausea, vomiting, constipation, or diarrhea. She has no melena or hematochezia. She has no urinary complaints. Patient offers no specific complaints today.  REVIEW OF SYSTEMS:   Review of Systems  Constitutional: Negative.  Negative for malaise/fatigue.  Respiratory: Negative.  Negative for shortness of breath.   Cardiovascular: Negative.  Negative for chest pain.  Gastrointestinal: Negative.  Negative for blood in stool and melena.  Musculoskeletal: Negative.   Neurological: Negative.  Negative for weakness.    As per HPI. Otherwise, a complete review of systems is negatve.  PAST MEDICAL HISTORY: Past Medical History  Diagnosis Date  . Skin cancer   . Anemia     PAST SURGICAL HISTORY: Past Surgical History  Procedure Laterality Date  . Tonsillectomy      age 24    FAMILY HISTORY: Sister with iron deficiency anemia, otherwise negative and noncontributory.     ADVANCED DIRECTIVES:    HEALTH MAINTENANCE: Social History  Substance Use Topics  . Smoking status: Never Smoker   . Smokeless tobacco: Not on file  . Alcohol Use: No     Colonoscopy:  PAP:  Bone density:  Lipid panel:  No Known Allergies  Current Outpatient Prescriptions  Medication Sig Dispense Refill  . Prenatal Vit-Fe Fumarate-FA (PRENATAL  MULTIVITAMIN) TABS tablet Take 1 tablet by mouth daily at 12 noon.     No current facility-administered medications for this visit.    OBJECTIVE: Filed Vitals:   04/29/15 1349  BP: 127/89  Pulse: 88  Temp: 97.2 F (36.2 C)  Resp: 16     Body mass index is 27.11 kg/(m^2).    ECOG FS:0 - Asymptomatic  General: Well-developed, well-nourished, no acute distress. Eyes: Pink conjunctiva, anicteric sclera. Lungs: Clear to auscultation bilaterally. Heart: Regular rate and rhythm. No rubs, murmurs, or gallops. Abdomen: Appears appropriate for gestational age. Musculoskeletal: No edema, cyanosis, or clubbing. Neuro: Alert, answering all questions appropriately. Cranial nerves grossly intact. Skin: No rashes or petechiae noted. Psych: Normal affect.   LAB RESULTS:  Lab Results  Component Value Date   NA 137 08/11/2012   K 3.9 08/11/2012   CL 105 08/11/2012   CO2 25 08/11/2012   GLUCOSE 84 08/11/2012   BUN 10 08/11/2012   CREATININE 0.8 08/11/2012   CALCIUM 8.9 08/11/2012   PROT 6.9 08/11/2012   ALBUMIN 3.3* 08/11/2012   AST 18 08/11/2012   ALT 13 08/11/2012   ALKPHOS 63 08/11/2012   BILITOT 0.5 08/11/2012    Lab Results  Component Value Date   WBC 10.2 04/29/2015   NEUTROABS 8.2* 04/29/2015   HGB 12.1 04/29/2015   HCT 36.7 04/29/2015   MCV 78.8* 04/29/2015   PLT 209 04/29/2015     STUDIES: No results found.  ASSESSMENT: Iron deficiency anemia in pregnancy.  PLAN:    1. Iron deficiency anemia: Patient's hemoglobin and iron stores  are now within normal limits. She does not require additional IV iron today. No further intervention is needed. Return to clinic in 4 months with repeat laboratory work and further evaluation. If patient's hemoglobin and iron stores continue to be within normal limits at that time, she likely can be discharged from clinic. 2. Pregnancy: Patient's due date is May 13, 2015. Continue monitoring by OB/GYN.  Patient expressed  understanding and was in agreement with this plan. She also understands that She can call clinic at any time with any questions, concerns, or complaints.    Lloyd Huger, MD   05/05/2015 8:01 AM

## 2015-05-11 ENCOUNTER — Encounter: Payer: Self-pay | Admitting: *Deleted

## 2015-05-11 ENCOUNTER — Inpatient Hospital Stay: Payer: BLUE CROSS/BLUE SHIELD | Admitting: Anesthesiology

## 2015-05-11 ENCOUNTER — Inpatient Hospital Stay
Admission: EM | Admit: 2015-05-11 | Discharge: 2015-05-13 | DRG: 775 | Disposition: A | Discharge status: Home / Self Care | Payer: BLUE CROSS/BLUE SHIELD | Attending: Obstetrics and Gynecology | Admitting: Obstetrics and Gynecology

## 2015-05-11 DIAGNOSIS — Z3A38 38 weeks gestation of pregnancy: Secondary | ICD-10-CM

## 2015-05-11 DIAGNOSIS — O134 Gestational [pregnancy-induced] hypertension without significant proteinuria, complicating childbirth: Principal | ICD-10-CM | POA: Diagnosis present

## 2015-05-11 DIAGNOSIS — Z85828 Personal history of other malignant neoplasm of skin: Secondary | ICD-10-CM

## 2015-05-11 DIAGNOSIS — O99824 Streptococcus B carrier state complicating childbirth: Secondary | ICD-10-CM | POA: Diagnosis present

## 2015-05-11 DIAGNOSIS — Z23 Encounter for immunization: Secondary | ICD-10-CM | POA: Diagnosis not present

## 2015-05-11 DIAGNOSIS — Z862 Personal history of diseases of the blood and blood-forming organs and certain disorders involving the immune mechanism: Secondary | ICD-10-CM

## 2015-05-11 DIAGNOSIS — Z3493 Encounter for supervision of normal pregnancy, unspecified, third trimester: Secondary | ICD-10-CM

## 2015-05-11 DIAGNOSIS — O133 Gestational [pregnancy-induced] hypertension without significant proteinuria, third trimester: Secondary | ICD-10-CM | POA: Diagnosis present

## 2015-05-11 DIAGNOSIS — O1400 Mild to moderate pre-eclampsia, unspecified trimester: Secondary | ICD-10-CM | POA: Diagnosis present

## 2015-05-11 LAB — COMPREHENSIVE METABOLIC PANEL
ALBUMIN: 2.8 g/dL — AB (ref 3.5–5.0)
ALT: 13 U/L — ABNORMAL LOW (ref 14–54)
ANION GAP: 11 (ref 5–15)
AST: 19 U/L (ref 15–41)
Alkaline Phosphatase: 206 U/L — ABNORMAL HIGH (ref 38–126)
BUN: 6 mg/dL (ref 6–20)
CHLORIDE: 106 mmol/L (ref 101–111)
CO2: 19 mmol/L — AB (ref 22–32)
Calcium: 8.8 mg/dL — ABNORMAL LOW (ref 8.9–10.3)
Creatinine, Ser: 0.45 mg/dL (ref 0.44–1.00)
GFR calc Af Amer: >60 mL/min (ref >60)
GFR calc non Af Amer: >60 mL/min (ref >60)
GLUCOSE: 71 mg/dL (ref 65–99)
POTASSIUM: 3.8 mmol/L (ref 3.5–5.1)
SODIUM: 136 mmol/L (ref 135–145)
Total Bilirubin: 0.6 mg/dL (ref 0.3–1.2)
Total Protein: 6.5 g/dL (ref 6.5–8.1)

## 2015-05-11 LAB — TYPE AND SCREEN
ABO/RH(D): O POS
Antibody Screen: NEGATIVE

## 2015-05-11 LAB — CBC
HCT: 39.5 % (ref 35.0–47.0)
HEMOGLOBIN: 13.2 g/dL (ref 12.0–16.0)
MCH: 27 pg (ref 26.0–34.0)
MCHC: 33.4 g/dL (ref 32.0–36.0)
MCV: 80.7 fL (ref 80.0–100.0)
PLATELETS: 229 10*3/uL (ref 150–440)
RBC: 4.9 MIL/uL (ref 3.80–5.20)
RDW: 33.3 % — ABNORMAL HIGH (ref 11.5–14.5)
WBC: 13.4 10*3/uL — ABNORMAL HIGH (ref 3.6–11.0)

## 2015-05-11 LAB — CHLAMYDIA/NGC RT PCR (ARMC ONLY)
Chlamydia Tr: NOT DETECTED
N gonorrhoeae: NOT DETECTED

## 2015-05-11 LAB — PROTEIN / CREATININE RATIO, URINE
CREATININE, URINE: 122 mg/dL
Protein Creatinine Ratio: 0.75 mg/mg{Cre} — ABNORMAL HIGH (ref 0.00–0.15)
TOTAL PROTEIN, URINE: 91 mg/dL

## 2015-05-11 LAB — ABO/RH: ABO/RH(D): O POS

## 2015-05-11 MED ORDER — CITRIC ACID-SODIUM CITRATE 334-500 MG/5ML PO SOLN: 30.0000 mL | ORAL | Status: DC | PRN

## 2015-05-11 MED ORDER — SCOPOLAMINE 1 MG/3DAYS TD PT72: 1.0000 | MEDICATED_PATCH | Freq: Once | TRANSDERMAL | Status: DC

## 2015-05-11 MED ORDER — OXYTOCIN 40 UNITS IN LACTATED RINGERS INFUSION - SIMPLE MED
62.5000 mL/h | INTRAVENOUS | Status: DC
  Administered 2015-05-11: 999 mL/h via INTRAVENOUS
  Filled 2015-05-11: qty 1000

## 2015-05-11 MED ORDER — OXYTOCIN 10 UNIT/ML IJ SOLN
INTRAMUSCULAR | Status: AC
  Filled 2015-05-11: qty 2

## 2015-05-11 MED ORDER — LIDOCAINE-EPINEPHRINE (PF) 1.5 %-1:200000 IJ SOLN
INTRAMUSCULAR | Status: DC | PRN
  Administered 2015-05-11: 3 mL via PERINEURAL

## 2015-05-11 MED ORDER — ACETAMINOPHEN 325 MG PO TABS: 650.0000 mg | ORAL_TABLET | ORAL | Status: DC | PRN

## 2015-05-11 MED ORDER — NALBUPHINE HCL 10 MG/ML IJ SOLN
5.0000 mg | INTRAMUSCULAR | Status: DC | PRN
  Filled 2015-05-11: qty 0.5

## 2015-05-11 MED ORDER — NALBUPHINE HCL 10 MG/ML IJ SOLN
5.0000 mg | Freq: Once | INTRAMUSCULAR | Status: DC | PRN
  Filled 2015-05-11: qty 0.5

## 2015-05-11 MED ORDER — FENTANYL 2.5 MCG/ML W/ROPIVACAINE 0.2% IN NS 100 ML EPIDURAL INFUSION (ARMC-ANES)
EPIDURAL | Status: AC
  Administered 2015-05-11: 10 mL/h via EPIDURAL
  Filled 2015-05-11: qty 100

## 2015-05-11 MED ORDER — OXYTOCIN BOLUS FROM INFUSION: 500.0000 mL | INTRAVENOUS | Status: DC

## 2015-05-11 MED ORDER — MISOPROSTOL 200 MCG PO TABS
ORAL_TABLET | ORAL | Status: AC
  Filled 2015-05-11: qty 4

## 2015-05-11 MED ORDER — OXYTOCIN 40 UNITS IN LACTATED RINGERS INFUSION - SIMPLE MED
1.0000 m[IU]/min | INTRAVENOUS | Status: DC
  Administered 2015-05-11: 1 m[IU]/min via INTRAVENOUS
  Filled 2015-05-11: qty 1000

## 2015-05-11 MED ORDER — TERBUTALINE SULFATE 1 MG/ML IJ SOLN: 0.2500 mg | Freq: Once | INTRAMUSCULAR | Status: DC | PRN

## 2015-05-11 MED ORDER — ONDANSETRON HCL 4 MG/2ML IJ SOLN: 4.0000 mg | Freq: Four times a day (QID) | INTRAMUSCULAR | Status: DC | PRN

## 2015-05-11 MED ORDER — OXYCODONE-ACETAMINOPHEN 5-325 MG PO TABS
1.0000 | ORAL_TABLET | ORAL | Status: DC | PRN
  Administered 2015-05-12 (×3): 1 via ORAL
  Filled 2015-05-11 (×3): qty 1

## 2015-05-11 MED ORDER — FENTANYL CITRATE (PF) 100 MCG/2ML IJ SOLN: 50.0000 ug | INTRAMUSCULAR | Status: DC | PRN

## 2015-05-11 MED ORDER — LACTATED RINGERS IV SOLN: 500.0000 mL | INTRAVENOUS | Status: DC | PRN

## 2015-05-11 MED ORDER — ONDANSETRON HCL 4 MG/2ML IJ SOLN: 4.0000 mg | Freq: Three times a day (TID) | INTRAMUSCULAR | Status: DC | PRN

## 2015-05-11 MED ORDER — IBUPROFEN 600 MG PO TABS
600.0000 mg | ORAL_TABLET | Freq: Four times a day (QID) | ORAL | Status: DC | PRN
  Administered 2015-05-11: 600 mg via ORAL
  Filled 2015-05-11 (×2): qty 1

## 2015-05-11 MED ORDER — LIDOCAINE HCL (PF) 1 % IJ SOLN
INTRAMUSCULAR | Status: AC
  Filled 2015-05-11: qty 30

## 2015-05-11 MED ORDER — AMMONIA AROMATIC IN INHA
RESPIRATORY_TRACT | Status: AC
  Filled 2015-05-11: qty 10

## 2015-05-11 MED ORDER — ACETAMINOPHEN 325 MG PO TABS
650.0000 mg | ORAL_TABLET | ORAL | Status: DC | PRN
  Filled 2015-05-11: qty 2

## 2015-05-11 MED ORDER — SODIUM CHLORIDE 0.9 % IJ SOLN: 3.0000 mL | INTRAMUSCULAR | Status: DC | PRN

## 2015-05-11 MED ORDER — LIDOCAINE HCL (PF) 1 % IJ SOLN
30.0000 mL | INTRAMUSCULAR | Status: DC | PRN
  Filled 2015-05-11: qty 30

## 2015-05-11 MED ORDER — SODIUM CHLORIDE 0.9 % IV SOLN
1.0000 g | INTRAVENOUS | Status: DC
  Administered 2015-05-11 (×2): 1 g via INTRAVENOUS
  Filled 2015-05-11 (×5): qty 1000

## 2015-05-11 MED ORDER — NALOXONE HCL 2 MG/2ML IJ SOSY
1.0000 ug/kg/h | PREFILLED_SYRINGE | INTRAVENOUS | Status: DC | PRN
  Filled 2015-05-11: qty 2

## 2015-05-11 MED ORDER — DIPHENHYDRAMINE HCL 50 MG/ML IJ SOLN: 12.5000 mg | INTRAMUSCULAR | Status: DC | PRN

## 2015-05-11 MED ORDER — DIPHENHYDRAMINE HCL 25 MG PO CAPS: 25.0000 mg | ORAL_CAPSULE | ORAL | Status: DC | PRN

## 2015-05-11 MED ORDER — NALOXONE HCL 0.4 MG/ML IJ SOLN: 0.4000 mg | INTRAMUSCULAR | Status: DC | PRN

## 2015-05-11 MED ORDER — FENTANYL 2.5 MCG/ML W/ROPIVACAINE 0.2% IN NS 100 ML EPIDURAL INFUSION (ARMC-ANES): 10.0000 mL/h | EPIDURAL | Status: DC

## 2015-05-11 MED ORDER — MEPERIDINE HCL 25 MG/ML IJ SOLN: 6.2500 mg | INTRAMUSCULAR | Status: DC | PRN

## 2015-05-11 MED ORDER — LACTATED RINGERS IV SOLN
INTRAVENOUS | Status: DC
  Administered 2015-05-11: 11:00:00 via INTRAVENOUS

## 2015-05-11 MED ORDER — SODIUM CHLORIDE 0.9 % IV SOLN
2.0000 g | Freq: Once | INTRAVENOUS | Status: AC
  Administered 2015-05-11: 2 g via INTRAVENOUS
  Filled 2015-05-11: qty 2000

## 2015-05-11 NOTE — H&P (Signed)
Obstetrics Admission History & Physical  05/11/2015 - 10:06 AM Primary OBGYN: [facility]  Chief Complaint: SROM 0800 today, clear History of Present Illness  24 y.o. G1 @ [redacted]w[redacted]d, with the above CC. Pregnancy complicated by: h/o CT with negative TOC, GBS pos .  Ms. TAELER ALMONOR states that not really feeling UCs, +clear fluid no s/s of pre-eclampsia  Review of Systems: her 12 point review of systems is negative or as noted in the History of Present Illness.  PMHx:  Past Medical History  Diagnosis Date  . Skin cancer   . Anemia    PSHx:  Past Surgical History  Procedure Laterality Date  . Tonsillectomy      age 74   Medications:  Prescriptions prior to admission  Medication Sig Dispense Refill Last Dose  . Prenatal Vit-Fe Fumarate-FA (PRENATAL MULTIVITAMIN) TABS tablet Take 1 tablet by mouth daily at 12 noon.   05/10/2015 at Unknown time     Allergies: has No Known Allergies. OBHx:  OB History  Gravida Para Term Preterm AB SAB TAB Ectopic Multiple Living  1             # Outcome Date GA Lbr Len/2nd Weight Sex Delivery Anes PTL Lv  1 Current              GYNHx:  History of abnormal pap smears: No. History of STIs: Yes.  Marland Kitchen             FHx: History reviewed. No pertinent family history. Soc Hx:  Social History   Social History  . Marital Status: Single    Spouse Name: N/A  . Number of Children: N/A  . Years of Education: N/A   Occupational History  . Not on file.   Social History Main Topics  . Smoking status: Never Smoker   . Smokeless tobacco: Not on file  . Alcohol Use: No  . Drug Use: No  . Sexual Activity: Not on file   Other Topics Concern  . Not on file   Social History Narrative    Objective  AF VS normal and stable except for mild range BPs  Category I tracing with irregular UCs seen  General: Well nourished, well developed female in no acute distress.  Skin:  Warm and dry.  Cardiovascular: Regular rate and rhythm. Respiratory:  Clear to  auscultation bilateral. Normal respiratory effort Abdomen: gravid nttp, cephalic Neuro/Psych:  Normal mood and affect. 1+ brachail   SSE: deferred  SVE: 2/50/high, clear fluid grossly ruptured  Le[place]/EFW: 3500gm  Labs  none  Radiology none    Assessment & Plan   24 y.o. G1 @ 38/4 with srom clear fluid and new dx of gHTN pt stable *IUP: catogory I with accels *SROM: start pitocin now; pt amenable to plan *GBS pos: start amp now *GHTN: f/u pre-x labs *Analgesia: no current needs *ID: negative GC/CT at 36wks  Durene Romans MD [place]  Pager: [place], [place] Pager [phone]

## 2015-05-11 NOTE — Plan of Care (Signed)
G1 P0 EDC 05/20/15 38.[redacted] weeks gestation arrived to [facility] with complaint of SROM/large amount of continuing fluid at 0830 this am.  Clear fluid noted on arrival per ER.  Pt to LDR 2 for assessment.  GBS + per chart. Will notify MD of patient arrival. Lenore Cordia RNC

## 2015-05-11 NOTE — Anesthesia Preprocedure Evaluation (Signed)
Anesthesia Evaluation  Patient identified by MRN, date of birth, ID band Patient awake    History of Anesthesia Complications Negative for: history of anesthetic complications  Airway Mallampati: II       Dental   Pulmonary neg pulmonary ROS,           Cardiovascular negative cardio ROS       Neuro/Psych negative neurological ROS     GI/Hepatic negative GI ROS, Neg liver ROS,   Endo/Other  negative endocrine ROS  Renal/GU negative Renal ROS     Musculoskeletal   Abdominal   Peds  Hematology  (+) anemia ,   Anesthesia Other Findings   Reproductive/Obstetrics                             Anesthesia Physical Anesthesia Plan  ASA: II  Anesthesia Plan: Epidural   Post-op Pain Management:    Induction:   Airway Management Planned:   Additional Equipment:   Intra-op Plan:   Post-operative Plan:   Informed Consent: I have reviewed the patients History and Physical, chart, labs and discussed the procedure including the risks, benefits and alternatives for the proposed anesthesia with the patient or authorized representative who has indicated his/her understanding and acceptance.     Plan Discussed with:   Anesthesia Plan Comments:         Anesthesia Quick Evaluation

## 2015-05-11 NOTE — Progress Notes (Signed)
Urine Specimen collected from catheter bag for GC/CT test and sent to lab.

## 2015-05-11 NOTE — Anesthesia Procedure Notes (Signed)
Epidural Patient location during procedure: OB Start time: 05/11/2015 5:25 PM End time: 05/11/2015 5:50 PM  Preanesthetic Checklist Completed: patient identified, site marked, surgical consent, pre-op evaluation, timeout performed, IV checked, risks and benefits discussed and monitors and equipment checked  Epidural Patient position: sitting Prep: Betadine Patient monitoring: heart rate, continuous pulse ox and blood pressure Approach: midline Location: L4-L5 Injection technique: LOR saline  Needle:  Needle type: Tuohy  Needle gauge: 18 G Needle length: 9 cm and 9 Needle insertion depth: 7 cm Catheter type: closed end flexible Catheter size: 20 Guage Catheter at skin depth: 8 cm Test dose: negative and 1.5% lidocaine with Epi 1:200 K  Assessment Events: blood not aspirated, injection not painful, no injection resistance, negative IV test and no paresthesia  Additional Notes   Patient tolerated the insertion well without complications.Reason for block:procedure for pain

## 2015-05-11 NOTE — Progress Notes (Addendum)
OB Note Pt just checked and complete. Not feeling any UCs. Did okay with practice push with RN but will sit her up and turn down epidural and reassess in 1hr. Fetus category I with accels, q1-79m UCs with pitocin  Durene Romans MD [place]  Pager: [phone]

## 2015-05-12 ENCOUNTER — Encounter: Payer: Self-pay | Admitting: Obstetrics and Gynecology

## 2015-05-12 LAB — RPR: RPR: NONREACTIVE

## 2015-05-12 MED ORDER — DIBUCAINE 1 % RE OINT
1.0000 | TOPICAL_OINTMENT | RECTAL | Status: DC | PRN
Start: 2015-05-12 — End: 2015-05-13

## 2015-05-12 MED ORDER — PRENATAL MULTIVITAMIN CH
1.0000 | ORAL_TABLET | Freq: Every day | ORAL | Status: DC
  Administered 2015-05-12 – 2015-05-13 (×2): 1 via ORAL
  Filled 2015-05-12 (×3): qty 1

## 2015-05-12 MED ORDER — WITCH HAZEL-GLYCERIN EX PADS: 1.0000 "application " | MEDICATED_PAD | CUTANEOUS | Status: DC | PRN

## 2015-05-12 MED ORDER — SIMETHICONE 80 MG PO CHEW: 80.0000 mg | CHEWABLE_TABLET | ORAL | Status: DC | PRN

## 2015-05-12 MED ORDER — BENZOCAINE-MENTHOL 20-0.5 % EX AERO
1.0000 "application " | INHALATION_SPRAY | CUTANEOUS | Status: DC | PRN
  Filled 2015-05-12: qty 56

## 2015-05-12 MED ORDER — DOCUSATE SODIUM 100 MG PO CAPS
100.0000 mg | ORAL_CAPSULE | Freq: Two times a day (BID) | ORAL | Status: DC
  Administered 2015-05-12 – 2015-05-13 (×3): 100 mg via ORAL
  Filled 2015-05-12 (×4): qty 1

## 2015-05-12 MED ORDER — OXYCODONE-ACETAMINOPHEN 5-325 MG PO TABS: 2.0000 | ORAL_TABLET | ORAL | Status: DC | PRN

## 2015-05-12 MED ORDER — TETANUS-DIPHTH-ACELL PERTUSSIS 5-2.5-18.5 LF-MCG/0.5 IM SUSP: 0.5000 mL | Freq: Once | INTRAMUSCULAR | Status: DC

## 2015-05-12 MED ORDER — INFLUENZA VAC SPLIT QUAD 0.5 ML IM SUSY
0.5000 mL | PREFILLED_SYRINGE | INTRAMUSCULAR | Status: AC
  Administered 2015-05-13: 0.5 mL via INTRAMUSCULAR
  Filled 2015-05-12: qty 0.5

## 2015-05-12 MED ORDER — LANOLIN HYDROUS EX OINT: TOPICAL_OINTMENT | CUTANEOUS | Status: DC | PRN

## 2015-05-12 MED ORDER — DIPHENHYDRAMINE HCL 25 MG PO CAPS: 25.0000 mg | ORAL_CAPSULE | Freq: Four times a day (QID) | ORAL | Status: DC | PRN

## 2015-05-12 MED ORDER — IBUPROFEN 600 MG PO TABS
600.0000 mg | ORAL_TABLET | Freq: Four times a day (QID) | ORAL | Status: DC
  Administered 2015-05-12 – 2015-05-13 (×5): 600 mg via ORAL
  Filled 2015-05-12 (×5): qty 1

## 2015-05-12 NOTE — Discharge Instructions (Signed)
Vaginal Delivery, Care After Refer to this sheet in the next few weeks. These discharge instructions provide you with information on caring for yourself after delivery. Your caregiver may also give you specific instructions. Your treatment has been planned according to the most current medical practices available, but problems sometimes occur. Call your caregiver if you have any problems or questions after you go home. HOME CARE INSTRUCTIONS 1. Take over-the-counter or prescription medicines only as directed by your caregiver or pharmacist. 2. Do not drink alcohol, especially if you are breastfeeding or taking medicine to relieve pain. 3. Do not smoke tobacco. 4. Continue to use good perineal care. Good perineal care includes: 1. Wiping your perineum from back to front 2. Keeping your perineum clean. 3. You can do sitz baths twice a day, to help keep this area clean 5. Do not use tampons, douche or have sex until your caregiver says it is okay. 6. Shower only and avoid sitting in submerged water, aside from sitz baths 7. Wear a well-fitting bra that provides breast support. 8. Eat healthy foods. 9. Drink enough fluids to keep your urine clear or pale yellow. 10. Eat high-fiber foods such as whole grain cereals and breads, brown rice, beans, and fresh fruits and vegetables every day. These foods may help prevent or relieve constipation. 11. Avoid constipation with high fiber foods or medications, such as miralax or metamucil 12. Follow your caregiver's recommendations regarding resumption of activities such as climbing stairs, driving, lifting, exercising, or traveling. 13. Talk to your caregiver about resuming sexual activities. Resumption of sexual activities is dependent upon your risk of infection, your rate of healing, and your comfort and desire to resume sexual activity. 14. Try to have someone help you with your household activities and your newborn for at least a few days after you leave  the hospital. 15. Rest as much as possible. Try to rest or take a nap when your newborn is sleeping. 16. Increase your activities gradually. 17. Keep all of your scheduled postpartum appointments. It is very important to keep your scheduled follow-up appointments. At these appointments, your caregiver will be checking to make sure that you are healing physically and emotionally. SEEK MEDICAL CARE IF:   You are passing large clots from your vagina. Save any clots to show your caregiver.  You have a foul smelling discharge from your vagina.  You have trouble urinating.  You are urinating frequently.  You have pain when you urinate.  You have a change in your bowel movements.  You have increasing redness, pain, or swelling near your vaginal incision (episiotomy) or vaginal tear.  You have pus draining from your episiotomy or vaginal tear.  Your episiotomy or vaginal tear is separating.  You have painful, hard, or reddened breasts.  You have a severe headache.  You have blurred vision or see spots.  You feel sad or depressed.  You have thoughts of hurting yourself or your newborn.  You have questions about your care, the care of your newborn, or medicines.  You are dizzy or light-headed.  You have a rash.  You have nausea or vomiting.  You were breastfeeding and have not had a menstrual period within 12 weeks after you stopped breastfeeding.  You are not breastfeeding and have not had a menstrual period by the 12th week after delivery.  You have a fever. SEEK IMMEDIATE MEDICAL CARE IF:   You have persistent pain.  You have chest pain.  You have shortness of breath.    You faint.  You have leg pain.  You have stomach pain.  Your vaginal bleeding saturates two or more sanitary pads in 1 hour. MAKE SURE YOU:   Understand these instructions.  Will watch your condition.  Will get help right away if you are not doing well or get worse. Document Released:  05/15/2000 Document Revised: 10/02/2013 Document Reviewed: 01/13/2012 ExitCare Patient Information 2015 [employer]. This information is not intended to replace advice given to you by your health care provider. Make sure you discuss any questions you have with your health care provider.  Sitz Bath A sitz bath is a warm water bath taken in the sitting position. The water covers only the hips and butt (buttocks). We recommend using one that fits in the toilet, to help with ease of use and cleanliness. It may be used for either healing or cleaning purposes. Sitz baths are also used to relieve pain, itching, or muscle tightening (spasms). The water may contain medicine. Moist heat will help you heal and relax.  HOME CARE  Take 3 to 4 sitz baths a day. 18. Fill the bathtub half-full with warm water. 19. Sit in the water and open the drain a little. 20. Turn on the warm water to keep the tub half-full. Keep the water running constantly. 21. Soak in the water for 15 to 20 minutes. 22. After the sitz bath, pat the affected area dry. GET HELP RIGHT AWAY IF: You get worse instead of better. Stop the sitz baths if you get worse. MAKE SURE YOU:  Understand these instructions.  Will watch your condition.  Will get help right away if you are not doing well or get worse. Document Released: 06/25/2004 Document Revised: 02/10/2012 Document Reviewed: 09/15/2010 ExitCare Patient Information 2015 [employer]. This information is not intended to replace advice given to you by your health care provider. Make sure you discuss any questions you have with your health care provider.    

## 2015-05-12 NOTE — Anesthesia Postprocedure Evaluation (Signed)
Anesthesia Post Note  Patient: [name]  Procedure(s) Performed: * No procedures listed *  Patient location during evaluation: Mother Baby Anesthesia Type: Epidural Level of consciousness: awake and alert Pain management: pain level controlled Vital Signs Assessment: post-procedure vital signs reviewed and stable Respiratory status: spontaneous breathing and respiratory function stable Cardiovascular status: stable Anesthetic complications: no    Last Vitals:  Filed Vitals:   05/12/15 1231 05/12/15 1627  BP: 119/71 120/77  Pulse: 74 116  Temp: 36.6 C 36.8 C  Resp: 20 18    Last Pain:  Filed Vitals:   05/12/15 1628  PainSc: 0-No pain                 KEPHART,WILLIAM K

## 2015-05-12 NOTE — Plan of Care (Signed)
Problem: Safety: Goal: Ability to remain free from injury will improve Outcome: Progressing 1. Instructed in Moderate Falls Precautions. V/O. Signed Falls Sheet.  Problem: Nutrition: Goal: Adequate nutrition will be maintained Outcome: Completed/Met Date Met:  05/12/15 Tolerating Reg. Diet.  Problem: Life Cycle: Goal: Risk for postpartum hemorrhage will decrease Outcome: Progressing Lochia Moderate at this Time. Fundus Firm at Umbilicus and sl. To right.   Problem: Nutritional: Goal: Dietary intake will improve Outcome: Completed/Met Date Met:  05/12/15 Tolerating Reg. Diet.

## 2015-05-12 NOTE — Progress Notes (Signed)
Daily Post Partum Note  [name] is a 24 y.o. G1P1001 PPD#1 s/p  SVD/2nd  @ [redacted]w[redacted]d.  Pregnancy c/b h/o CT with negative TOC and GBS pos  24hr/overnight events:  none  Subjective:  Patient doing well. Lochia decreasing  Objective:    Current Vital Signs 24h Vital Sign Ranges  T 97.7 F (36.5 C) Temp  Avg: 98.1 F (36.7 C)  Min: 97.6 F (36.4 C)  Max: 98.6 F (37 C)  BP 123/84 mmHg BP  Min: 118/83  Max: 140/96  HR 77 Pulse  Avg: 97.3  Min: 77  Max: 116  RR 18 Resp  Avg: 17.1  Min: 16  Max: 18  SaO2 98 % Not Delivered SpO2  Avg: 99.9 %  Min: 98 %  Max: 100 %       24 Hour I/O Current Shift I/O  Time Ins Outs 12/10 0701 - 12/11 0700 In: -  Out: 180 [Urine:180]      General: NAD Abdomen: FF below the umiblicus, nttp Perineum: deferred Skin:  Warm and dry.  Cardiovascular: Regular rate and rhythm. Respiratory:  Clear to auscultation bilateral. Normal respiratory effort Extremities: no c/c/e  Medications Current Facility-Administered Medications  Medication Dose Route Frequency Provider Last Rate Last Dose  . acetaminophen (TYLENOL) tablet 650 mg  650 mg Oral Q4H PRN Aletha Halim, MD      . benzocaine-Menthol (DERMOPLAST) 20-0.5 % topical spray 1 application  1 application Topical PRN Aletha Halim, MD      . witch hazel-glycerin (TUCKS) pad 1 application  1 application Topical PRN Aletha Halim, MD       And  . dibucaine (NUPERCAINAL) 1 % rectal ointment 1 application  1 application Rectal PRN Aletha Halim, MD      . diphenhydrAMINE (BENADRYL) injection 12.5 mg  12.5 mg Intravenous Q4H PRN Gunnar Fusi, MD       Or  . diphenhydrAMINE (BENADRYL) capsule 25 mg  25 mg Oral Q4H PRN Gunnar Fusi, MD      . diphenhydrAMINE (BENADRYL) capsule 25 mg  25 mg Oral Q6H PRN Aletha Halim, MD      . docusate sodium (COLACE) capsule 100 mg  100 mg Oral BID Aletha Halim, MD   100 mg at 05/12/15 0030  . fentaNYL 2.5 mcg/ml w/ropivacaine 0.2% (preservative  free) in normal saline 100 mL EPIDURAL Infusion in 150 ml Intravia Bag  10 mL/hr Epidural Continuous Gunnar Fusi, MD 10 mL/hr at 05/11/15 2118 10 mL/hr at 05/11/15 2118  . ibuprofen (ADVIL,MOTRIN) tablet 600 mg  600 mg Oral Q6H PRN Gunnar Fusi, MD   600 mg at 05/11/15 2359  . ibuprofen (ADVIL,MOTRIN) tablet 600 mg  600 mg Oral 4 times per day Aletha Halim, MD   600 mg at 05/12/15 0650  . [START ON 05/13/2015] Influenza vac split quadrivalent PF (FLUARIX) injection 0.5 mL  0.5 mL Intramuscular Tomorrow-1000 Aletha Halim, MD      . lanolin ointment   Topical PRN Aletha Halim, MD      . nalbuphine (NUBAIN) injection 5 mg  5 mg Intravenous Q4H PRN Gunnar Fusi, MD       Or  . nalbuphine (NUBAIN) injection 5 mg  5 mg Subcutaneous Q4H PRN Gunnar Fusi, MD      . nalbuphine (NUBAIN) injection 5 mg  5 mg Intravenous Once PRN Gunnar Fusi, MD       Or  . nalbuphine (NUBAIN) injection 5 mg  5 mg Subcutaneous Once PRN Gunnar Fusi, MD      . naloxone [facility]) 2 mg in dextrose 5 % 250 mL infusion  1-4 mcg/kg/hr Intravenous Continuous PRN Gunnar Fusi, MD      . naloxone [facility]) injection 0.4 mg  0.4 mg Intravenous PRN Gunnar Fusi, MD       And  . sodium chloride 0.9 % injection 3 mL  3 mL Intravenous PRN Gunnar Fusi, MD      . ondansetron [facility]) injection 4 mg  4 mg Intravenous Q8H PRN Gunnar Fusi, MD      . oxyCODONE-acetaminophen (PERCOCET/ROXICET) 5-325 MG per tablet 1 tablet  1 tablet Oral Q4H PRN Aletha Halim, MD   1 tablet at 05/12/15 [phone]  . oxyCODONE-acetaminophen (PERCOCET/ROXICET) 5-325 MG per tablet 2 tablet  2 tablet Oral Q4H PRN Aletha Halim, MD      . prenatal multivitamin tablet 1 tablet  1 tablet Oral Q1200 Aletha Halim, MD      . scopolamine (TRANSDERM-SCOP) 1 MG/3DAYS 1.5 mg  1 patch Transdermal Once Gunnar Fusi, MD      . simethicone [facility]) chewable tablet 80 mg  80 mg Oral PRN Aletha Halim, MD      .  Tdap (BOOSTRIX) injection 0.5 mL  0.5 mL Intramuscular Once Aletha Halim, MD       Facility-Administered Medications Ordered in Other Encounters  Medication Dose Route Frequency Provider Last Rate Last Dose  . lidocaine-EPINEPHrine 1.5 %-1:200000 injection    Anesthesia Intra-op Gunnar Fusi, MD   3 mL at 05/11/15 1733    Labs:  No new labs  Assessment & Plan:  Pt doing well *Postpartum/postop: routine care *Dispo: likely tomorrow  O POS / Rubella Immune / Varicella Immune/  RPR negative / HIV negative / HepBsAg negative / Tdap UTD: yes/Flu shot: oredered/ pap neg 2016 / Bottle  / Contraception: undecided/ Follow up: [facility]  Durene Romans MD [facility] Pager [phone]

## 2015-05-12 NOTE — Discharge Summary (Addendum)
Obstetrical Discharge Summary  Date of Admission: 05/11/2015 Date of Discharge: 05/13/2015  Primary OB: [facility]  Gestational Age at Delivery: [redacted]w[redacted]d   Antepartum complications: h/o chlamydia with negative test of cure, GBS pos, anemia requiring hematology consult and iron transfusions Reason for Admission: SROM Date of Delivery: 05/11/2015  Delivered By: Durene Romans MD Delivery Type: spontaneous vaginal delivery Intrapartum complications/course: None Anesthesia: epidural Placenta: expressed. Intact: yes. To pathology: no.  Laceration: 2nd degree repaired Episiotomy: none Baby: Liveborn female, APGARs7/9, weight 3490 g.   Postpartum course: Postpartum course has been uncomplicated. By PPD #2 she was afebrile and normotensive, was tolerating a regular diet and voiding without difficulty. Her pain was controlled with oral analgesics, bleeding was appropriate and she was caring for baby appropriately. Discharge Vital Signs: Patient Vitals for the past 6 hrs:  BP Temp Temp src Pulse Resp SpO2  05/13/15 0824 117/82 mmHg 98 F (36.7 C) Oral 72 18 100 %   Results for orders placed or performed during the hospital encounter of 05/11/15 (from the past 24 hour(s))  Hematocrit     Status: None   Collection Time: 05/13/15 10:04 AM  Result Value Ref Range   HCT 36.2 35.0 - 47.0 %    Discharge Exam:  NAD Perineum: intact, no hematoma or inflammation Abdomen: firm fundus 1FB below the umbilicus/ ML/NT Ext: no tenderness or redness  Disposition: Home  Rh Immune globulin given: not applicable Rubella vaccine given: not applicable Tdap vaccine given in AP or PP setting: yes Flu vaccine given in AP or PP setting: ordered  Contraception: BCP  Prenatal/Postnatal Panel: O POS//Rubella Immune//Varicella Immune//RPR negative//HIV negative/HepB Surface Ag negative//pap no abnormalities (date: 2016)//plans to bottle feed  Plan:  [name] was discharged to home in good  condition. Follow-up appointment with Dr. Ilda Basset in 4 weeks  Discharge Medications:   Medication List    TAKE these medications        benzocaine-Menthol 20-0.5 % Aero  Commonly known as:  DERMOPLAST  Apply 1 application topically 4 (four) times daily as needed for irritation (perineal discomfort).     ibuprofen 600 MG tablet  Commonly known as:  ADVIL,MOTRIN  Take 1 tablet (600 mg total) by mouth every 6 (six) hours as needed for mild pain.     Norethindrone Acetate-Ethinyl Estrad-FE 1-20 MG-MCG(24) tablet  Commonly known as:  LOESTRIN 24 FE  Take 1 tablet by mouth daily.     oxyCODONE-acetaminophen 5-325 MG tablet  Commonly known as:  PERCOCET/ROXICET  Take 1 tablet by mouth every 6 (six) hours as needed for moderate pain or severe pain (for pain scale 4-7).     prenatal multivitamin Tabs tablet  Take 1 tablet by mouth daily at 12 noon.       Baby Garrett/ Bottle/ Home with mother  Dalia Heading, CNM

## 2015-05-13 LAB — HEMATOCRIT: HCT: 36.2 % (ref 35.0–47.0)

## 2015-05-13 MED ORDER — OXYCODONE-ACETAMINOPHEN 5-325 MG PO TABS: 1.0000 | ORAL_TABLET | Freq: Four times a day (QID) | ORAL | Status: DC | PRN

## 2015-05-13 MED ORDER — BENZOCAINE-MENTHOL 20-0.5 % EX AERO: 1.0000 "application " | INHALATION_SPRAY | Freq: Four times a day (QID) | CUTANEOUS | Status: DC | PRN

## 2015-05-13 MED ORDER — NORETHIN ACE-ETH ESTRAD-FE 1-20 MG-MCG(24) PO TABS: 1.0000 | ORAL_TABLET | Freq: Every day | ORAL | Status: DC

## 2015-05-13 MED ORDER — IBUPROFEN 600 MG PO TABS: 600.0000 mg | ORAL_TABLET | Freq: Four times a day (QID) | ORAL | Status: DC | PRN

## 2015-06-18 ENCOUNTER — Encounter: Payer: Self-pay | Admitting: Family Medicine

## 2015-06-18 ENCOUNTER — Telehealth: Payer: Self-pay | Admitting: Family Medicine

## 2015-06-18 NOTE — Telephone Encounter (Signed)
Immunization record has been faxed and tb skin test can be scheduled any day but Thursday

## 2015-06-18 NOTE — Telephone Encounter (Signed)
Tb skin test included in information originally faxed

## 2015-06-18 NOTE — Telephone Encounter (Signed)
She just need to know when her last tb test was

## 2015-06-18 NOTE — Telephone Encounter (Signed)
Meggin called she needs her immunization records and tb test She needs this to be faxed to [phone]

## 2015-06-25 ENCOUNTER — Ambulatory Visit: Payer: BLUE CROSS/BLUE SHIELD | Admitting: Family Medicine

## 2015-06-26 ENCOUNTER — Ambulatory Visit: Payer: BLUE CROSS/BLUE SHIELD | Admitting: Family Medicine

## 2015-06-27 ENCOUNTER — Other Ambulatory Visit: Payer: Self-pay | Admitting: Family Medicine

## 2015-06-27 ENCOUNTER — Encounter: Payer: Self-pay | Admitting: Family Medicine

## 2015-06-27 ENCOUNTER — Ambulatory Visit (INDEPENDENT_AMBULATORY_CARE_PROVIDER_SITE_OTHER): Payer: BLUE CROSS/BLUE SHIELD | Admitting: Family Medicine

## 2015-06-27 VITALS — BP 110/70 | HR 85 | Temp 99.1°F | Wt 165.2 lb

## 2015-06-27 DIAGNOSIS — D509 Iron deficiency anemia, unspecified: Secondary | ICD-10-CM | POA: Insufficient documentation

## 2015-06-27 LAB — CBC WITH DIFFERENTIAL/PLATELET
Basophils Absolute: 0 10*3/uL (ref 0.0–0.1)
Basophils Relative: 0.3 % (ref 0.0–3.0)
EOS ABS: 0.1 10*3/uL (ref 0.0–0.7)
Eosinophils Relative: 0.9 % (ref 0.0–5.0)
HCT: 32.2 % — ABNORMAL LOW (ref 36.0–46.0)
Hemoglobin: 10.2 g/dL — ABNORMAL LOW (ref 12.0–15.0)
LYMPHS ABS: 2.5 10*3/uL (ref 0.7–4.0)
Lymphocytes Relative: 40.3 % (ref 12.0–46.0)
MCHC: 31.8 g/dL (ref 30.0–36.0)
MCV: 73.1 fl — ABNORMAL LOW (ref 78.0–100.0)
MONO ABS: 0.4 10*3/uL (ref 0.1–1.0)
Monocytes Relative: 6.3 % (ref 3.0–12.0)
NEUTROS ABS: 3.2 10*3/uL (ref 1.4–7.7)
NEUTROS PCT: 52.2 % (ref 43.0–77.0)
Platelets: 285 10*3/uL (ref 150.0–400.0)
RBC: 4.41 Mil/uL (ref 3.87–5.11)
RDW: 18.7 % — ABNORMAL HIGH (ref 11.5–15.5)
WBC: 6.2 10*3/uL (ref 4.0–10.5)

## 2015-06-27 LAB — IBC PANEL
Iron: 40 ug/dL — ABNORMAL LOW (ref 42–145)
Saturation Ratios: 5.9 % — ABNORMAL LOW (ref 20.0–50.0)
TRANSFERRIN: 481 mg/dL — AB (ref 212.0–360.0)

## 2015-06-27 LAB — FERRITIN: Ferritin: 2.6 ng/mL — ABNORMAL LOW (ref 10.0–291.0)

## 2015-06-27 NOTE — Addendum Note (Signed)
Addended by: Alvina Chou on: 06/27/2015 09:24 AM   Modules accepted: Orders

## 2015-06-27 NOTE — Progress Notes (Signed)
Subjective:   Patient ID: Natalie Wu, female    DOB: 11-01-1990, 25 y.o.   MRN: [id number]  Natalie Wu is a pleasant 25 y.o. year old female who presents to clinic today with Follow-up  on 06/27/2015  HPI:  H/o iron deficiency anemia- was taking oral iron 325 mg daily but stopped taking it last year because it was causing GI upset/constipation.  Colace and miralax were not effective.   Was referred to hematology, Dr. Francee Piccolo and work up was negative.  Twin sister was referred for same issue and has had issues with iron deficiency anemia since she was a small child.   Has had two receive two courses of IV iron, most recently October 2011. Denies any SOB, DO but she is tired.   Lab Results  Component Value Date   WBC 6.3 11/06/2014   HGB 9.8* 11/06/2014   HCT 31.0* 11/06/2014   MCV 71.6* 11/06/2014   PLT 284.0 11/06/2014   Current Outpatient Prescriptions on File Prior to Visit  Medication Sig Dispense Refill  . Norgestimate-Ethinyl Estradiol Triphasic (TRI-SPRINTEC) 0.18/0.215/0.25 MG-35 MCG tablet Take 1 tablet by mouth daily. 84 tablet 1  . ferrous sulfate (IRON SUPPLEMENT) 325 (65 FE) MG tablet Take 1 tablet (325 mg total) by mouth daily with breakfast. (Patient not taking: Reported on 06/27/2015) 30 tablet 5   No current facility-administered medications on file prior to visit.    No Known Allergies  Past Medical History  Diagnosis Date  . Iron deficiency anemia     Past Surgical History  Procedure Laterality Date  . Tonsillectomy      No family history on file.  Social History   Social History  . Marital Status: Single    Spouse Name: N/A  . Number of Children: N/A  . Years of Education: N/A   Occupational History  . Not on file.   Social History Main Topics  . Smoking status: Never Smoker   . Smokeless tobacco: Not on file  . Alcohol Use: Not on file  . Drug Use: Not on file  . Sexual Activity: Not on file   Other Topics Concern  .  Not on file   Social History Narrative   Single   Has twin sister, Programmer, applications in high school   Not sexually active   Wants to be physical therapist   The PMH, PSH, Social History, Family History, Medications, and allergies have been reviewed in [facility], and have been updated if relevant.   Review of Systems  Constitutional: Positive for fatigue.  HENT: Negative.   Respiratory: Negative.   Cardiovascular: Negative.   Gastrointestinal: Positive for constipation.  Musculoskeletal: Negative.   Neurological: Negative.   Hematological: Negative.   All other systems reviewed and are negative.      Objective:    BP 110/70 mmHg  Pulse 85  Temp(Src) 99.1 F (37.3 C) (Oral)  Wt 165 lb 4 oz (74.957 kg)  SpO2 99%  LMP 05/30/2015   Physical Exam  Constitutional: She is oriented to person, place, and time. She appears well-developed and well-nourished. No distress.  HENT:  Head: Normocephalic.  Eyes: Conjunctivae are normal.  Cardiovascular: Normal rate and regular rhythm.   Pulmonary/Chest: Effort normal.  Musculoskeletal: Normal range of motion. She exhibits no edema.  Neurological: She is alert and oriented to person, place, and time. No cranial nerve deficit.  Skin: Skin is warm and dry. She is not diaphoretic.  Psychiatric: She has a normal  mood and affect. Her behavior is normal. Judgment and thought content normal.  Nursing note and vitals reviewed.         Assessment & Plan:   Anemia, iron deficiency - Plan: CBC with Differential/Platelet, Ferritin, Iron and TIBC No Follow-up on file.

## 2015-06-27 NOTE — Progress Notes (Signed)
Pre visit review using our clinic review tool, if applicable. No additional management support is needed unless otherwise documented below in the visit note. 

## 2015-06-27 NOTE — Assessment & Plan Note (Signed)
Likely deteriorated. Not taking iron and she is mildly symptomatic. ? IV iron at this point since she is not tolerating oral iron. Check labs today, pending results, will refer back to hematology. The patient indicates understanding of these issues and agrees with the plan.

## 2015-07-10 ENCOUNTER — Inpatient Hospital Stay: Payer: BLUE CROSS/BLUE SHIELD | Attending: Oncology | Admitting: Oncology

## 2015-07-10 VITALS — BP 119/81 | HR 83 | Temp 99.1°F | Resp 16 | Wt 166.0 lb

## 2015-07-10 DIAGNOSIS — D509 Iron deficiency anemia, unspecified: Secondary | ICD-10-CM

## 2015-07-10 DIAGNOSIS — Z79899 Other long term (current) drug therapy: Secondary | ICD-10-CM | POA: Diagnosis not present

## 2015-07-10 NOTE — Progress Notes (Signed)
[facility]  Telephone:([phone] Fax:([phone]) [phone]  ID: Natalie Wu OB: 1991/02/21  MR#: [id number]  AOZ#:[id number]  Patient Care Team: Dianne Dun, MD as PCP - General  CHIEF COMPLAINT:  Chief Complaint  Patient presents with  . New Evaluation  . Anemia    INTERVAL HISTORY:  Patient last evaluated in clinic in November 2011. She is referred back for further evaluation and consideration of IV iron. Patient was noted to have a declining hemoglobin and iron stores and routine blood work. She states she is unable to tolerate oral iron supplementation. She currently feels well. She does not complain of weakness or fatigue. She has no neurologic complaints. She denies any recent fevers. She has no chest pain or shortness of breath. She denies any nausea, vomiting, constipation, or diarrhea. Patient offers no specific complaints today.     REVIEW OF SYSTEMS:   Review of Systems  Constitutional: Negative for fever, weight loss and malaise/fatigue.  Respiratory: Negative.  Negative for shortness of breath.   Cardiovascular: Negative.  Negative for chest pain.  Gastrointestinal: Negative.  Negative for blood in stool and melena.  Genitourinary: Negative.   Musculoskeletal: Negative.   Neurological: Negative.  Negative for weakness.    As per HPI. Otherwise, a complete review of systems is negatve.  PAST MEDICAL HISTORY: Past Medical History  Diagnosis Date  . Iron deficiency anemia     PAST SURGICAL HISTORY: Past Surgical History  Procedure Laterality Date  . Tonsillectomy      FAMILY HISTORY: Positive for lung cancer, breast cancer. Also diabetes, heart disease, hypertension, and "liver disease".     ADVANCED DIRECTIVES:    HEALTH MAINTENANCE: Social History  Substance Use Topics  . Smoking status: Never Smoker   . Smokeless tobacco: Not on file  . Alcohol Use: Not on file     Colonoscopy:  PAP:  Bone density:  Lipid panel:  No  Known Allergies  Current Outpatient Prescriptions  Medication Sig Dispense Refill  . Norgestimate-Ethinyl Estradiol Triphasic (TRI-SPRINTEC) 0.18/0.215/0.25 MG-35 MCG tablet Take 1 tablet by mouth daily. 84 tablet 1   No current facility-administered medications for this visit.    OBJECTIVE: Filed Vitals:   07/10/15 1357  BP: 119/81  Pulse: 83  Temp: 99.1 F (37.3 C)  Resp: 16     Body mass index is 28.94 kg/(m^2).    ECOG FS:0 - Asymptomatic  General: Well-developed, well-nourished, no acute distress. Eyes: Pink conjunctiva, anicteric sclera. HEENT: Normocephalic, moist mucous membranes, clear oropharnyx. Lungs: Clear to auscultation bilaterally. Heart: Regular rate and rhythm. No rubs, murmurs, or gallops. Abdomen: Soft, nontender, nondistended. No organomegaly noted, normoactive bowel sounds. Musculoskeletal: No edema, cyanosis, or clubbing. Neuro: Alert, answering all questions appropriately. Cranial nerves grossly intact. Skin: No rashes or petechiae noted. Psych: Normal affect. Lymphatics: No cervical, calvicular, axillary or inguinal LAD.   LAB RESULTS:  Lab Results  Component Value Date   NA 136 04/19/2014   K 3.7 04/19/2014   CL 105 04/19/2014   CO2 23 04/19/2014   GLUCOSE 85 04/19/2014   BUN 8 04/19/2014   CREATININE 0.8 04/19/2014   CALCIUM 8.9 04/19/2014   PROT 7.2 04/19/2014   ALBUMIN 3.8 04/19/2014   AST 18 04/19/2014   ALT 12 04/19/2014   ALKPHOS 62 04/19/2014   BILITOT 0.6 04/19/2014    Lab Results  Component Value Date   WBC 6.2 06/27/2015   NEUTROABS 3.2 06/27/2015   HGB 10.2* 06/27/2015   HCT 32.2* 06/27/2015  MCV 73.1* 06/27/2015   PLT 285.0 06/27/2015     STUDIES: No results found.  ASSESSMENT: Iron deficiency anemia.  PLAN:    1. Iron deficiency anemia: Patient's hemoglobin and iron stores are noted to be decreased, therefore she will benefit from 510 mg IV Feraheme. Patient has stated she cannot tolerate oral iron  supplementation. Return to clinic in 1 and 2 weeks for IV Feraheme. Patient will then return to clinic in 3 months with repeat laboratory work and further evaluation. At her next lab draw will also draw additional anemia labs for completeness.  Patient expressed understanding and was in agreement with this plan. She also understands that She can call clinic at any time with any questions, concerns, or complaints.   Jeralyn Ruths, MD   07/10/2015 2:27 PM

## 2015-07-10 NOTE — Progress Notes (Signed)
Patient last seen at the [facility] for anemia in 2011 and has been referred back due to declining hemoglobin.  Has taken oral iron but not able to tolerate due to constipation.  She does have symptoms of fatigue.

## 2015-07-12 ENCOUNTER — Inpatient Hospital Stay: Payer: BLUE CROSS/BLUE SHIELD

## 2015-07-12 VITALS — BP 119/79 | HR 82 | Temp 98.5°F

## 2015-07-12 DIAGNOSIS — D509 Iron deficiency anemia, unspecified: Secondary | ICD-10-CM | POA: Diagnosis not present

## 2015-07-12 MED ORDER — SODIUM CHLORIDE 0.9 % IV SOLN
Freq: Once | INTRAVENOUS | Status: AC
  Administered 2015-07-12: 14:00:00 via INTRAVENOUS
  Filled 2015-07-12: qty 1000

## 2015-07-12 MED ORDER — SODIUM CHLORIDE 0.9 % IV SOLN
510.0000 mg | Freq: Once | INTRAVENOUS | Status: AC
  Administered 2015-07-12: 510 mg via INTRAVENOUS
  Filled 2015-07-12: qty 17

## 2015-07-19 ENCOUNTER — Inpatient Hospital Stay: Payer: BLUE CROSS/BLUE SHIELD

## 2015-07-19 VITALS — BP 115/76 | HR 96 | Temp 96.8°F | Resp 20

## 2015-07-19 DIAGNOSIS — D509 Iron deficiency anemia, unspecified: Secondary | ICD-10-CM | POA: Diagnosis not present

## 2015-07-19 MED ORDER — SODIUM CHLORIDE 0.9 % IV SOLN
510.0000 mg | Freq: Once | INTRAVENOUS | Status: AC
  Administered 2015-07-19: 510 mg via INTRAVENOUS
  Filled 2015-07-19: qty 17

## 2015-07-19 MED ORDER — SODIUM CHLORIDE 0.9 % IV SOLN
Freq: Once | INTRAVENOUS | Status: AC
  Administered 2015-07-19: 14:00:00 via INTRAVENOUS
  Filled 2015-07-19: qty 1000

## 2015-08-06 ENCOUNTER — Telehealth: Payer: Self-pay

## 2015-08-06 NOTE — Telephone Encounter (Signed)
[name] [phone]   Mervin Kung dropped off a form to be filled out for her new job, plus she needs a list of her immunizations. Please call her when ready. -- Form placed in RX box in the front.

## 2015-08-07 ENCOUNTER — Other Ambulatory Visit: Payer: BLUE CROSS/BLUE SHIELD

## 2015-08-07 ENCOUNTER — Ambulatory Visit: Payer: BLUE CROSS/BLUE SHIELD

## 2015-08-07 ENCOUNTER — Ambulatory Visit: Payer: BLUE CROSS/BLUE SHIELD | Admitting: Oncology

## 2015-08-07 NOTE — Telephone Encounter (Signed)
Lm on pts vm informing her paperwork is available for pickup from the front desk 

## 2015-08-07 NOTE — Telephone Encounter (Signed)
I do not see this form on my desk.

## 2015-08-08 ENCOUNTER — Encounter: Payer: Self-pay | Admitting: Family Medicine

## 2015-08-12 ENCOUNTER — Inpatient Hospital Stay: Payer: BLUE CROSS/BLUE SHIELD | Attending: Oncology | Admitting: Oncology

## 2015-08-12 ENCOUNTER — Ambulatory Visit: Payer: BLUE CROSS/BLUE SHIELD

## 2015-08-12 ENCOUNTER — Inpatient Hospital Stay: Payer: BLUE CROSS/BLUE SHIELD

## 2015-08-12 ENCOUNTER — Other Ambulatory Visit: Payer: BLUE CROSS/BLUE SHIELD

## 2015-08-12 ENCOUNTER — Ambulatory Visit: Payer: BLUE CROSS/BLUE SHIELD | Admitting: Oncology

## 2015-08-12 VITALS — BP 95/66 | HR 82 | Temp 98.7°F | Resp 16 | Wt 136.0 lb

## 2015-08-12 DIAGNOSIS — D509 Iron deficiency anemia, unspecified: Secondary | ICD-10-CM

## 2015-08-12 DIAGNOSIS — Z85828 Personal history of other malignant neoplasm of skin: Secondary | ICD-10-CM | POA: Diagnosis not present

## 2015-08-12 LAB — IRON AND TIBC
IRON: 41 ug/dL (ref 28–170)
Saturation Ratios: 9 % — ABNORMAL LOW (ref 10.4–31.8)
TIBC: 464 ug/dL — ABNORMAL HIGH (ref 250–450)
UIBC: 423 ug/dL

## 2015-08-12 LAB — CBC WITH DIFFERENTIAL/PLATELET
BASOS PCT: 0 %
Basophils Absolute: 0 10*3/uL (ref 0–0.1)
EOS ABS: 0 10*3/uL (ref 0–0.7)
Eosinophils Relative: 1 %
HEMATOCRIT: 36.9 % (ref 35.0–47.0)
HEMOGLOBIN: 12.5 g/dL (ref 12.0–16.0)
LYMPHS ABS: 2 10*3/uL (ref 1.0–3.6)
Lymphocytes Relative: 35 %
MCH: 28.4 pg (ref 26.0–34.0)
MCHC: 33.9 g/dL (ref 32.0–36.0)
MCV: 83.8 fL (ref 80.0–100.0)
MONO ABS: 0.4 10*3/uL (ref 0.2–0.9)
MONOS PCT: 7 %
NEUTROS PCT: 57 %
Neutro Abs: 3.1 10*3/uL (ref 1.4–6.5)
Platelets: 223 10*3/uL (ref 150–440)
RBC: 4.4 MIL/uL (ref 3.80–5.20)
RDW: 13.5 % (ref 11.5–14.5)
WBC: 5.5 10*3/uL (ref 3.6–11.0)

## 2015-08-12 LAB — FERRITIN: Ferritin: 5 ng/mL — ABNORMAL LOW (ref 11–307)

## 2015-08-12 NOTE — Progress Notes (Signed)
Patient does not offer any problems today.  

## 2015-08-18 NOTE — Progress Notes (Signed)
[facility]  [phone] [phone]) [phone]  ID: Ellwood Handler OB: [birth year]  MR#: [id number]  [id number]  Patient Care Team: Lucille Passy, MD as PCP - General  CHIEF COMPLAINT:  Chief Complaint  Patient presents with  . Anemia    INTERVAL HISTORY: Patient returns to clinic today for repeat laboratory work and further evaluation. She had an uneventful birth approximately 2 months ago. She currently feels well and is asymptomatic. She does not complain of weakness or fatigue. She has no neurologic complaints. She denies any chest pain or shortness of breath. She denies any nausea, vomiting, constipation, or diarrhea. She has no melena or hematochezia. She has no urinary complaints. Patient offers no specific complaints today.  REVIEW OF SYSTEMS:   Review of Systems  Constitutional: Negative.  Negative for malaise/fatigue.  Respiratory: Negative.  Negative for shortness of breath.   Cardiovascular: Negative.  Negative for chest pain.  Gastrointestinal: Negative.  Negative for blood in stool and melena.  Musculoskeletal: Negative.   Neurological: Negative.  Negative for weakness.    As per HPI. Otherwise, a complete review of systems is negatve.  PAST MEDICAL HISTORY: Past Medical History  Diagnosis Date  . Skin cancer   . Anemia     PAST SURGICAL HISTORY: Past Surgical History  Procedure Laterality Date  . Tonsillectomy      age 25    FAMILY HISTORY: Sister with iron deficiency anemia, otherwise negative and noncontributory.     ADVANCED DIRECTIVES:    HEALTH MAINTENANCE: Social History  Substance Use Topics  . Smoking status: Never Smoker   . Smokeless tobacco: Not on file  . Alcohol Use: No     Colonoscopy:  PAP:  Bone density:  Lipid panel:  No Known Allergies  Current Outpatient Prescriptions  Medication Sig Dispense Refill  . ibuprofen (ADVIL,MOTRIN) 600 MG tablet Take 1 tablet (600 mg total) by mouth every 6  (six) hours as needed for mild pain. 30 tablet 0  . Norethindrone Acetate-Ethinyl Estrad-FE (LOESTRIN 24 FE) 1-20 MG-MCG(24) tablet Take 1 tablet by mouth daily. 1 Package 11  . Prenatal Vit-Fe Fumarate-FA (PRENATAL MULTIVITAMIN) TABS tablet Take 1 tablet by mouth daily at 12 noon.     No current facility-administered medications for this visit.    OBJECTIVE: Filed Vitals:   08/12/15 1408  BP: 95/66  Pulse: 82  Temp: 98.7 F (37.1 C)  Resp: 16     Body mass index is 24.87 kg/(m^2).    ECOG FS:0 - Asymptomatic  General: Well-developed, well-nourished, no acute distress. Eyes: Pink conjunctiva, anicteric sclera. Lungs: Clear to auscultation bilaterally. Heart: Regular rate and rhythm. No rubs, murmurs, or gallops. Abdomen: Nontender, normoactive bowel sounds. Musculoskeletal: No edema, cyanosis, or clubbing. Neuro: Alert, answering all questions appropriately. Cranial nerves grossly intact. Skin: No rashes or petechiae noted. Psych: Normal affect.   LAB RESULTS:  Lab Results  Component Value Date   NA 136 05/11/2015   K 3.8 05/11/2015   CL 106 05/11/2015   CO2 19* 05/11/2015   GLUCOSE 71 05/11/2015   BUN 6 05/11/2015   CREATININE 0.45 05/11/2015   CALCIUM 8.8* 05/11/2015   PROT 6.5 05/11/2015   ALBUMIN 2.8* 05/11/2015   AST 19 05/11/2015   ALT 13* 05/11/2015   ALKPHOS 206* 05/11/2015   BILITOT 0.6 05/11/2015   GFRNONAA >60 05/11/2015   GFRAA >60 05/11/2015    Lab Results  Component Value Date   WBC 5.5 08/12/2015   NEUTROABS 3.1 08/12/2015  HGB 12.5 08/12/2015   HCT 36.9 08/12/2015   MCV 83.8 08/12/2015   PLT 223 08/12/2015   Lab Results  Component Value Date   IRON 41 08/12/2015   TIBC 464* 08/12/2015   IRONPCTSAT 9* 08/12/2015    Lab Results  Component Value Date   FERRITIN 5* 08/12/2015     STUDIES: No results found.  ASSESSMENT: Iron deficiency anemia in pregnancy.  PLAN:    1. Iron deficiency anemia: Patient's hemoglobin is now within  normal limits, but her iron stores continue to be decreased. Continue oral iron supplementation. Will call patient to see if she would want to return to clinic for additional IV iron. At this point, no follow-up has been scheduled.    Patient expressed understanding and was in agreement with this plan. She also understands that She can call clinic at any time with any questions, concerns, or complaints.    Lloyd Huger, MD   08/18/2015 10:12 AM

## 2015-08-19 ENCOUNTER — Telehealth: Payer: Self-pay | Admitting: *Deleted

## 2015-08-19 ENCOUNTER — Other Ambulatory Visit: Payer: Self-pay | Admitting: Family Medicine

## 2015-08-19 NOTE — Telephone Encounter (Signed)
Call placed to patient regarding lab results from last week, low iron stores. Patient to call back and let us know if she would like to return to clinic for feraheme infusion.

## 2015-08-27 ENCOUNTER — Other Ambulatory Visit: Payer: BLUE CROSS/BLUE SHIELD

## 2015-08-27 ENCOUNTER — Ambulatory Visit: Payer: BLUE CROSS/BLUE SHIELD | Admitting: Oncology

## 2015-08-27 ENCOUNTER — Ambulatory Visit: Payer: BLUE CROSS/BLUE SHIELD

## 2015-10-07 ENCOUNTER — Ambulatory Visit: Payer: BLUE CROSS/BLUE SHIELD

## 2015-10-07 ENCOUNTER — Other Ambulatory Visit: Payer: BLUE CROSS/BLUE SHIELD

## 2015-10-07 ENCOUNTER — Ambulatory Visit: Payer: BLUE CROSS/BLUE SHIELD | Admitting: Oncology

## 2015-10-10 ENCOUNTER — Ambulatory Visit: Payer: BLUE CROSS/BLUE SHIELD

## 2015-10-10 ENCOUNTER — Ambulatory Visit: Payer: BLUE CROSS/BLUE SHIELD | Admitting: Oncology

## 2015-10-10 ENCOUNTER — Other Ambulatory Visit: Payer: BLUE CROSS/BLUE SHIELD

## 2015-10-15 ENCOUNTER — Inpatient Hospital Stay: Payer: BLUE CROSS/BLUE SHIELD | Attending: Oncology

## 2015-10-15 ENCOUNTER — Inpatient Hospital Stay (HOSPITAL_BASED_OUTPATIENT_CLINIC_OR_DEPARTMENT_OTHER): Payer: BLUE CROSS/BLUE SHIELD | Admitting: Oncology

## 2015-10-15 VITALS — BP 134/84 | HR 87 | Resp 18 | Wt 167.3 lb

## 2015-10-15 DIAGNOSIS — E538 Deficiency of other specified B group vitamins: Secondary | ICD-10-CM | POA: Insufficient documentation

## 2015-10-15 DIAGNOSIS — Z800 Family history of malignant neoplasm of digestive organs: Secondary | ICD-10-CM

## 2015-10-15 DIAGNOSIS — Z79899 Other long term (current) drug therapy: Secondary | ICD-10-CM | POA: Insufficient documentation

## 2015-10-15 DIAGNOSIS — D509 Iron deficiency anemia, unspecified: Secondary | ICD-10-CM | POA: Diagnosis not present

## 2015-10-15 DIAGNOSIS — Z803 Family history of malignant neoplasm of breast: Secondary | ICD-10-CM | POA: Diagnosis not present

## 2015-10-15 LAB — CBC WITH DIFFERENTIAL/PLATELET
Basophils Absolute: 0 10*3/uL (ref 0–0.1)
Basophils Relative: 0 %
Eosinophils Absolute: 0.1 10*3/uL (ref 0–0.7)
Eosinophils Relative: 1 %
HEMATOCRIT: 39.5 % (ref 35.0–47.0)
HEMOGLOBIN: 13.8 g/dL (ref 12.0–16.0)
LYMPHS ABS: 2.7 10*3/uL (ref 1.0–3.6)
Lymphocytes Relative: 34 %
MCH: 30.9 pg (ref 26.0–34.0)
MCHC: 35.1 g/dL (ref 32.0–36.0)
MCV: 88.2 fL (ref 80.0–100.0)
MONOS PCT: 4 %
Monocytes Absolute: 0.3 10*3/uL (ref 0.2–0.9)
NEUTROS ABS: 4.7 10*3/uL (ref 1.4–6.5)
NEUTROS PCT: 61 %
Platelets: 274 10*3/uL (ref 150–440)
RBC: 4.47 MIL/uL (ref 3.80–5.20)
RDW: 13.5 % (ref 11.5–14.5)
WBC: 7.9 10*3/uL (ref 3.6–11.0)

## 2015-10-15 LAB — LACTATE DEHYDROGENASE: LDH: 118 U/L (ref 98–192)

## 2015-10-15 LAB — IRON AND TIBC
Iron: 70 ug/dL (ref 28–170)
Saturation Ratios: 15 % (ref 10.4–31.8)
TIBC: 464 ug/dL — ABNORMAL HIGH (ref 250–450)
UIBC: 394 ug/dL

## 2015-10-15 LAB — FERRITIN: Ferritin: 44 ng/mL (ref 11–307)

## 2015-10-15 LAB — FOLATE: FOLATE: 13.3 ng/mL (ref >5.9)

## 2015-10-15 LAB — VITAMIN B12: VITAMIN B 12: 156 pg/mL — AB (ref 180–914)

## 2015-10-15 LAB — DAT, POLYSPECIFIC AHG (ARMC ONLY): Polyspecific AHG test: NEGATIVE

## 2015-10-15 NOTE — Progress Notes (Signed)
Patient does not offer any problems today.  

## 2015-10-20 NOTE — Progress Notes (Signed)
[facility]  Telephone:(336) [phone] Fax:(336) [phone]  ID: Natalie Wu OB: [birth year]  MR#: [id number]  AOZ#:[id number]#:[id number]  Patient Care Team: Dianne Dunalia M Aron, MD as PCP - General  CHIEF COMPLAINT:  Chief Complaint  Patient presents with  . Anemia    INTERVAL HISTORY:  Patient returns to clinic today for repeat laboratory work and further evaluation. She continues to feel well and remains asymptomatic. She does not complain of weakness or fatigue. She has no neurologic complaints. She denies any recent fevers. She has no chest pain or shortness of breath. She denies any nausea, vomiting, constipation, or diarrhea. Patient offers no specific complaints today.    REVIEW OF SYSTEMS:   Review of Systems  Constitutional: Negative for fever, weight loss and malaise/fatigue.  Respiratory: Negative.  Negative for shortness of breath.   Cardiovascular: Negative.  Negative for chest pain.  Gastrointestinal: Negative.  Negative for blood in stool and melena.  Genitourinary: Negative.   Musculoskeletal: Negative.   Neurological: Negative.  Negative for weakness.    As per HPI. Otherwise, a complete review of systems is negatve.  PAST MEDICAL HISTORY: Past Medical History  Diagnosis Date  . Iron deficiency anemia     PAST SURGICAL HISTORY: Past Surgical History  Procedure Laterality Date  . Tonsillectomy      FAMILY HISTORY: Positive for lung cancer, breast cancer. Also diabetes, heart disease, hypertension, and "liver disease".     ADVANCED DIRECTIVES:    HEALTH MAINTENANCE: Social History  Substance Use Topics  . Smoking status: Never Smoker   . Smokeless tobacco: Not on file  . Alcohol Use: Not on file     Colonoscopy:  PAP:  Bone density:  Lipid panel:  No Known Allergies  Current Outpatient Prescriptions  Medication Sig Dispense Refill  . Norgestimate-Ethinyl Estradiol Triphasic 0.18/0.215/0.25 MG-35 MCG tablet TAKE 1 TABLET BY MOUTH DAILY  84 tablet 2   No current facility-administered medications for this visit.    OBJECTIVE: Filed Vitals:   10/15/15 1553  BP: 134/84  Pulse: 87  Resp: 18     Body mass index is 29.17 kg/(m^2).    ECOG FS:0 - Asymptomatic  General: Well-developed, well-nourished, no acute distress. Eyes: Pink conjunctiva, anicteric sclera. Lungs: Clear to auscultation bilaterally. Heart: Regular rate and rhythm. No rubs, murmurs, or gallops. Abdomen: Soft, nontender, nondistended. No organomegaly noted, normoactive bowel sounds. Musculoskeletal: No edema, cyanosis, or clubbing. Neuro: Alert, answering all questions appropriately. Cranial nerves grossly intact. Skin: No rashes or petechiae noted. Psych: Normal affect.   LAB RESULTS:  Lab Results  Component Value Date   NA 136 04/19/2014   K 3.7 04/19/2014   CL 105 04/19/2014   CO2 23 04/19/2014   GLUCOSE 85 04/19/2014   BUN 8 04/19/2014   CREATININE 0.8 04/19/2014   CALCIUM 8.9 04/19/2014   PROT 7.2 04/19/2014   ALBUMIN 3.8 04/19/2014   AST 18 04/19/2014   ALT 12 04/19/2014   ALKPHOS 62 04/19/2014   BILITOT 0.6 04/19/2014    Lab Results  Component Value Date   WBC 7.9 10/15/2015   NEUTROABS 4.7 10/15/2015   HGB 13.8 10/15/2015   HCT 39.5 10/15/2015   MCV 88.2 10/15/2015   PLT 274 10/15/2015   Lab Results  Component Value Date   IRON 70 10/15/2015   TIBC 464* 10/15/2015   IRONPCTSAT 15 10/15/2015    Lab Results  Component Value Date   FERRITIN 44 10/15/2015     STUDIES: No results found.  ASSESSMENT: Iron deficiency anemia.  PLAN:    1. Iron deficiency anemia: Patient's hemoglobin and iron stores are now within normal limits. Patient has stated she cannot tolerate oral iron supplementation. She does not require additional Feraheme today. Patient last received Feraheme in February 2017. Patient is getting married in September 2017, therefore she will return to clinic in the first week of August for repeat laboratory  work, further evaluation, and consideration of additional IV iron. 2. B 12 deficiency: Patient's B-12 levels are mildly decreased, monitor. Consider intramuscular B-12 injection at next clinic visit.  Patient expressed understanding and was in agreement with this plan. She also understands that She can call clinic at any time with any questions, concerns, or complaints.   Jeralyn Ruths, MD   10/20/2015 9:55 AM

## 2015-12-10 ENCOUNTER — Ambulatory Visit: Payer: BLUE CROSS/BLUE SHIELD

## 2015-12-10 ENCOUNTER — Ambulatory Visit: Payer: BLUE CROSS/BLUE SHIELD | Admitting: Oncology

## 2015-12-10 ENCOUNTER — Other Ambulatory Visit: Payer: BLUE CROSS/BLUE SHIELD

## 2015-12-20 ENCOUNTER — Other Ambulatory Visit: Payer: Self-pay | Admitting: *Deleted

## 2015-12-20 DIAGNOSIS — Z862 Personal history of diseases of the blood and blood-forming organs and certain disorders involving the immune mechanism: Secondary | ICD-10-CM

## 2015-12-25 ENCOUNTER — Inpatient Hospital Stay: Payer: BLUE CROSS/BLUE SHIELD | Attending: Oncology | Admitting: Oncology

## 2015-12-25 ENCOUNTER — Inpatient Hospital Stay: Payer: BLUE CROSS/BLUE SHIELD

## 2015-12-25 VITALS — BP 110/69 | HR 79 | Temp 97.2°F | Resp 20 | Wt 134.0 lb

## 2015-12-25 DIAGNOSIS — R531 Weakness: Secondary | ICD-10-CM | POA: Insufficient documentation

## 2015-12-25 DIAGNOSIS — Z79899 Other long term (current) drug therapy: Secondary | ICD-10-CM | POA: Diagnosis not present

## 2015-12-25 DIAGNOSIS — Z85828 Personal history of other malignant neoplasm of skin: Secondary | ICD-10-CM | POA: Diagnosis not present

## 2015-12-25 DIAGNOSIS — Z862 Personal history of diseases of the blood and blood-forming organs and certain disorders involving the immune mechanism: Secondary | ICD-10-CM

## 2015-12-25 DIAGNOSIS — D509 Iron deficiency anemia, unspecified: Secondary | ICD-10-CM | POA: Diagnosis not present

## 2015-12-25 DIAGNOSIS — R5383 Other fatigue: Secondary | ICD-10-CM | POA: Diagnosis not present

## 2015-12-25 LAB — CBC WITH DIFFERENTIAL/PLATELET
BASOS ABS: 0 10*3/uL (ref 0–0.1)
Basophils Relative: 1 %
Eosinophils Absolute: 0 10*3/uL (ref 0–0.7)
Eosinophils Relative: 1 %
HEMATOCRIT: 36.9 % (ref 35.0–47.0)
Hemoglobin: 12.5 g/dL (ref 12.0–16.0)
LYMPHS PCT: 38 %
Lymphs Abs: 2.5 10*3/uL (ref 1.0–3.6)
MCH: 26.2 pg (ref 26.0–34.0)
MCHC: 33.8 g/dL (ref 32.0–36.0)
MCV: 77.4 fL — AB (ref 80.0–100.0)
Monocytes Absolute: 0.4 10*3/uL (ref 0.2–0.9)
Monocytes Relative: 6 %
NEUTROS ABS: 3.6 10*3/uL (ref 1.4–6.5)
Neutrophils Relative %: 54 %
Platelets: 261 10*3/uL (ref 150–440)
RBC: 4.77 MIL/uL (ref 3.80–5.20)
RDW: 15.9 % — ABNORMAL HIGH (ref 11.5–14.5)
WBC: 6.5 10*3/uL (ref 3.6–11.0)

## 2015-12-25 LAB — IRON AND TIBC
Iron: 52 ug/dL (ref 28–170)
SATURATION RATIOS: 9 % — AB (ref 10.4–31.8)
TIBC: 585 ug/dL — AB (ref 250–450)
UIBC: 533 ug/dL

## 2015-12-25 LAB — FERRITIN: Ferritin: 4 ng/mL — ABNORMAL LOW (ref 11–307)

## 2015-12-25 MED ORDER — SODIUM CHLORIDE 0.9 % IV SOLN
Freq: Once | INTRAVENOUS | Status: AC
  Administered 2015-12-25: 15:00:00 via INTRAVENOUS
  Filled 2015-12-25: qty 1000

## 2015-12-25 MED ORDER — SODIUM CHLORIDE 0.9 % IV SOLN
510.0000 mg | Freq: Once | INTRAVENOUS | Status: AC
  Administered 2015-12-25: 510 mg via INTRAVENOUS
  Filled 2015-12-25: qty 17

## 2015-12-25 NOTE — Progress Notes (Signed)
Iron and ferritin labs not back. Per Dr Grayland Ormond proceed with feraheme infusion as patient is not feeling well and previous Iron studies were low.

## 2015-12-25 NOTE — Progress Notes (Signed)
[facility]  Telephone:(336) [phone] Fax:(336) [phone]  ID: Ellwood Handler OB: [birth year]  MR#: [id number]  Patient Care Team: Lucille Passy, MD as PCP - General  CHIEF COMPLAINT: Iron deficiency anemia.  INTERVAL HISTORY: Patient last evaluated in clinic in March 2017. Patient was found to have decreased iron stores at that time, but elected to attempt oral supplementation. She returns to clinic today complaining of significant increased weakness and fatigue. She otherwise feels well and is asymptomatic. She has no neurologic complaints. She denies any chest pain or shortness of breath. She denies any nausea, vomiting, constipation, or diarrhea. She has no melena or hematochezia. She has no urinary complaints. Patient offers no further specific complaints today.  REVIEW OF SYSTEMS:   Review of Systems  Constitutional: Positive for malaise/fatigue.  Respiratory: Negative.  Negative for shortness of breath.   Cardiovascular: Negative.  Negative for chest pain.  Gastrointestinal: Negative.  Negative for blood in stool and melena.  Musculoskeletal: Negative.   Neurological: Positive for weakness.  Psychiatric/Behavioral: Negative.     As per HPI. Otherwise, a complete review of systems is negatve.  PAST MEDICAL HISTORY: Past Medical History:  Diagnosis Date  . Anemia   . Skin cancer     PAST SURGICAL HISTORY: Past Surgical History:  Procedure Laterality Date  . TONSILLECTOMY     age 25    FAMILY HISTORY: Sister with iron deficiency anemia, otherwise negative and noncontributory.     ADVANCED DIRECTIVES:    HEALTH MAINTENANCE: Social History  Substance Use Topics  . Smoking status: Never Smoker  . Smokeless tobacco: Not on file  . Alcohol use No     Colonoscopy:  PAP:  Bone density:  Lipid panel:  No Known Allergies  Current Outpatient Prescriptions  Medication Sig Dispense Refill  . Norethindrone Acetate-Ethinyl Estrad-FE  (LOESTRIN 24 FE) 1-20 MG-MCG(24) tablet Take 1 tablet by mouth daily. 1 Package 11   No current facility-administered medications for this visit.     OBJECTIVE: Vitals:   12/25/15 1408  BP: 110/69  Pulse: 79  Resp: 20  Temp: 97.2 F (36.2 C)     Body mass index is 24.52 kg/m.    ECOG FS:0 - Asymptomatic  General: Well-developed, well-nourished, no acute distress. Eyes: Pink conjunctiva, anicteric sclera. Lungs: Clear to auscultation bilaterally. Heart: Regular rate and rhythm. No rubs, murmurs, or gallops. Abdomen: Nontender, normoactive bowel sounds. Musculoskeletal: No edema, cyanosis, or clubbing. Neuro: Alert, answering all questions appropriately. Cranial nerves grossly intact. Skin: No rashes or petechiae noted. Psych: Normal affect.   LAB RESULTS:  Lab Results  Component Value Date   NA 136 05/11/2015   K 3.8 05/11/2015   CL 106 05/11/2015   CO2 19 (L) 05/11/2015   GLUCOSE 71 05/11/2015   BUN 6 05/11/2015   CREATININE 0.45 05/11/2015   CALCIUM 8.8 (L) 05/11/2015   PROT 6.5 05/11/2015   ALBUMIN 2.8 (L) 05/11/2015   AST 19 05/11/2015   ALT 13 (L) 05/11/2015   ALKPHOS 206 (H) 05/11/2015   BILITOT 0.6 05/11/2015   GFRNONAA >60 05/11/2015   GFRAA >60 05/11/2015    Lab Results  Component Value Date   WBC 6.5 12/25/2015   NEUTROABS 3.6 12/25/2015   HGB 12.5 12/25/2015   HCT 36.9 12/25/2015   MCV 77.4 (L) 12/25/2015   PLT 261 12/25/2015   Lab Results  Component Value Date   IRON 41 08/12/2015   TIBC 464 (H) 08/12/2015   IRONPCTSAT 9 (  L) 08/12/2015    Lab Results  Component Value Date   FERRITIN 5 (L) 08/12/2015     STUDIES: No results found.  ASSESSMENT: Iron deficiency anemia  PLAN:    1. Iron deficiency anemia: Previously, patient's iron stores are significantly decreased. Her hemoglobin has now also trended down and she is symptomatic. Proceed with 510 mg IV Feraheme today. Return to clinic in 1 week for a second infusion. Patient will  then return to clinic in 4 months with repeat laboratory work and further evaluation. She has been instructed to continue her oral iron supplementation.   Patient expressed understanding and was in agreement with this plan. She also understands that She can call clinic at any time with any questions, concerns, or complaints.    Lloyd Huger, MD   12/25/2015 2:19 PM

## 2015-12-25 NOTE — Progress Notes (Signed)
Patient here today for follow up regarding anemia. Patient reports low energy and fatigue.

## 2015-12-30 NOTE — Progress Notes (Signed)
[facility]  Telephone:(336) [phone] Fax:(336) [phone]  ID: Natalie Wu OB: [birth year]  MR#: [id number]  AOZ#:[id number]  Patient Care Team: Dianne Dun, MD as PCP - General  CHIEF COMPLAINT: Iron deficiency anemia.  INTERVAL HISTORY:  Patient returns to clinic today for repeat laboratory work and further evaluation. She continues to feel well and remains asymptomatic. She does not complain of weakness or fatigue. She has no neurologic complaints. She denies any recent fevers. She has no chest pain or shortness of breath. She denies any nausea, vomiting, constipation, or diarrhea. She has no urinary complaints.  Patient offers no specific complaints today.    REVIEW OF SYSTEMS:   Review of Systems  Constitutional: Negative for fever, malaise/fatigue and weight loss.  Respiratory: Negative.  Negative for shortness of breath.   Cardiovascular: Negative.  Negative for chest pain.  Gastrointestinal: Negative.  Negative for blood in stool and melena.  Genitourinary: Negative.   Musculoskeletal: Negative.   Neurological: Negative.  Negative for weakness.    As per HPI. Otherwise, a complete review of systems is negatve.  PAST MEDICAL HISTORY: Past Medical History:  Diagnosis Date  . Iron deficiency anemia     PAST SURGICAL HISTORY: Past Surgical History:  Procedure Laterality Date  . TONSILLECTOMY      FAMILY HISTORY: Positive for lung cancer, breast cancer. Also diabetes, heart disease, hypertension, and "liver disease".     ADVANCED DIRECTIVES:    HEALTH MAINTENANCE: Social History  Substance Use Topics  . Smoking status: Never Smoker  . Smokeless tobacco: Never Used  . Alcohol use No     Colonoscopy:  PAP:  Bone density:  Lipid panel:  No Known Allergies  Current Outpatient Prescriptions  Medication Sig Dispense Refill  . Norgestimate-Ethinyl Estradiol Triphasic 0.18/0.215/0.25 MG-35 MCG tablet TAKE 1 TABLET BY MOUTH DAILY 84 tablet  2   No current facility-administered medications for this visit.     OBJECTIVE: Vitals:   12/31/15 1416  BP: 129/84  Pulse: 74  Resp: 17  Temp: (!) 95.3 F (35.2 C)     Body mass index is 29.33 kg/m.    ECOG FS:0 - Asymptomatic  General: Well-developed, well-nourished, no acute distress. Eyes: Pink conjunctiva, anicteric sclera. Lungs: Clear to auscultation bilaterally. Heart: Regular rate and rhythm. No rubs, murmurs, or gallops. Abdomen: Soft, nontender, nondistended. No organomegaly noted, normoactive bowel sounds. Musculoskeletal: No edema, cyanosis, or clubbing. Neuro: Alert, answering all questions appropriately. Cranial nerves grossly intact. Skin: No rashes or petechiae noted. Psych: Normal affect.   LAB RESULTS:  Lab Results  Component Value Date   NA 136 04/19/2014   K 3.7 04/19/2014   CL 105 04/19/2014   CO2 23 04/19/2014   GLUCOSE 85 04/19/2014   BUN 8 04/19/2014   CREATININE 0.8 04/19/2014   CALCIUM 8.9 04/19/2014   PROT 7.2 04/19/2014   ALBUMIN 3.8 04/19/2014   AST 18 04/19/2014   ALT 12 04/19/2014   ALKPHOS 62 04/19/2014   BILITOT 0.6 04/19/2014    Lab Results  Component Value Date   WBC 8.4 12/31/2015   NEUTROABS 5.2 12/31/2015   HGB 14.2 12/31/2015   HCT 41.1 12/31/2015   MCV 88.6 12/31/2015   PLT 304 12/31/2015   Lab Results  Component Value Date   IRON 83 12/31/2015   TIBC 486 (H) 12/31/2015   IRONPCTSAT 17 12/31/2015    Lab Results  Component Value Date   FERRITIN 37 12/31/2015     STUDIES: No results found.  ASSESSMENT: Iron deficiency anemia.  PLAN:    1. Iron deficiency anemia: Patient's hemoglobin and iron stores Continue to be within normal limits. Patient has stated she cannot tolerate oral iron supplementation. She does not require additional Feraheme today. Patient last received Feraheme in February 2017. Patient is getting married in September 2017. Return to clinic in 6 months for repeat laboratory work, further  evaluation, and consideration of additional IV iron. 2. B12 deficiency: Mild, monitor.  Patient expressed understanding and was in agreement with this plan. She also understands that She can call clinic at any time with any questions, concerns, or complaints.   Jeralyn Ruths, MD   01/01/2016 10:10 PM

## 2015-12-31 ENCOUNTER — Other Ambulatory Visit: Payer: BLUE CROSS/BLUE SHIELD

## 2015-12-31 ENCOUNTER — Inpatient Hospital Stay: Payer: BLUE CROSS/BLUE SHIELD

## 2015-12-31 ENCOUNTER — Encounter: Payer: Self-pay | Admitting: Oncology

## 2015-12-31 ENCOUNTER — Other Ambulatory Visit: Payer: Self-pay

## 2015-12-31 ENCOUNTER — Ambulatory Visit: Payer: BLUE CROSS/BLUE SHIELD | Admitting: Oncology

## 2015-12-31 ENCOUNTER — Inpatient Hospital Stay: Payer: BLUE CROSS/BLUE SHIELD | Attending: Oncology | Admitting: Oncology

## 2015-12-31 VITALS — BP 129/84 | HR 74 | Temp 95.3°F | Resp 17 | Ht 63.5 in | Wt 168.2 lb

## 2015-12-31 DIAGNOSIS — D509 Iron deficiency anemia, unspecified: Secondary | ICD-10-CM | POA: Insufficient documentation

## 2015-12-31 DIAGNOSIS — E538 Deficiency of other specified B group vitamins: Secondary | ICD-10-CM | POA: Insufficient documentation

## 2015-12-31 DIAGNOSIS — Z800 Family history of malignant neoplasm of digestive organs: Secondary | ICD-10-CM | POA: Diagnosis not present

## 2015-12-31 DIAGNOSIS — Z803 Family history of malignant neoplasm of breast: Secondary | ICD-10-CM | POA: Diagnosis not present

## 2015-12-31 DIAGNOSIS — Z79899 Other long term (current) drug therapy: Secondary | ICD-10-CM | POA: Insufficient documentation

## 2015-12-31 LAB — CBC WITH DIFFERENTIAL/PLATELET
BASOS PCT: 0 %
Basophils Absolute: 0 10*3/uL (ref 0–0.1)
EOS ABS: 0.1 10*3/uL (ref 0–0.7)
Eosinophils Relative: 1 %
HCT: 41.1 % (ref 35.0–47.0)
Hemoglobin: 14.2 g/dL (ref 12.0–16.0)
LYMPHS ABS: 2.7 10*3/uL (ref 1.0–3.6)
Lymphocytes Relative: 33 %
MCH: 30.6 pg (ref 26.0–34.0)
MCHC: 34.6 g/dL (ref 32.0–36.0)
MCV: 88.6 fL (ref 80.0–100.0)
MONO ABS: 0.3 10*3/uL (ref 0.2–0.9)
MONOS PCT: 4 %
Neutro Abs: 5.2 10*3/uL (ref 1.4–6.5)
Neutrophils Relative %: 62 %
Platelets: 304 10*3/uL (ref 150–440)
RBC: 4.64 MIL/uL (ref 3.80–5.20)
RDW: 12.2 % (ref 11.5–14.5)
WBC: 8.4 10*3/uL (ref 3.6–11.0)

## 2015-12-31 LAB — IRON AND TIBC
IRON: 83 ug/dL (ref 28–170)
SATURATION RATIOS: 17 % (ref 10.4–31.8)
TIBC: 486 ug/dL — AB (ref 250–450)
UIBC: 403 ug/dL

## 2015-12-31 LAB — FERRITIN: FERRITIN: 37 ng/mL (ref 11–307)

## 2015-12-31 NOTE — Progress Notes (Signed)
No changes since last visit. 

## 2016-01-01 ENCOUNTER — Ambulatory Visit: Payer: BLUE CROSS/BLUE SHIELD

## 2016-01-16 ENCOUNTER — Inpatient Hospital Stay: Payer: BLUE CROSS/BLUE SHIELD | Attending: Oncology

## 2016-01-16 ENCOUNTER — Encounter (INDEPENDENT_AMBULATORY_CARE_PROVIDER_SITE_OTHER): Payer: Self-pay

## 2016-01-16 VITALS — BP 110/79 | HR 84 | Resp 18

## 2016-01-16 DIAGNOSIS — D509 Iron deficiency anemia, unspecified: Secondary | ICD-10-CM | POA: Insufficient documentation

## 2016-01-16 DIAGNOSIS — Z79899 Other long term (current) drug therapy: Secondary | ICD-10-CM | POA: Insufficient documentation

## 2016-01-16 DIAGNOSIS — Z862 Personal history of diseases of the blood and blood-forming organs and certain disorders involving the immune mechanism: Secondary | ICD-10-CM

## 2016-01-16 MED ORDER — SODIUM CHLORIDE 0.9 % IV SOLN
510.0000 mg | Freq: Once | INTRAVENOUS | Status: AC
  Administered 2016-01-16: 510 mg via INTRAVENOUS
  Filled 2016-01-16: qty 17

## 2016-01-16 MED ORDER — SODIUM CHLORIDE 0.9 % IV SOLN
Freq: Once | INTRAVENOUS | Status: AC
  Administered 2016-01-16: 14:00:00 via INTRAVENOUS
  Filled 2016-01-16: qty 1000

## 2016-01-17 ENCOUNTER — Telehealth: Payer: Self-pay | Admitting: *Deleted

## 2016-01-17 ENCOUNTER — Encounter: Payer: Self-pay | Admitting: Sports Medicine

## 2016-01-17 ENCOUNTER — Ambulatory Visit (INDEPENDENT_AMBULATORY_CARE_PROVIDER_SITE_OTHER): Payer: BLUE CROSS/BLUE SHIELD | Admitting: Sports Medicine

## 2016-01-17 DIAGNOSIS — M779 Enthesopathy, unspecified: Secondary | ICD-10-CM | POA: Diagnosis not present

## 2016-01-17 DIAGNOSIS — G5791 Unspecified mononeuropathy of right lower limb: Secondary | ICD-10-CM | POA: Diagnosis not present

## 2016-01-17 DIAGNOSIS — G5751 Tarsal tunnel syndrome, right lower limb: Secondary | ICD-10-CM

## 2016-01-17 DIAGNOSIS — M79673 Pain in unspecified foot: Secondary | ICD-10-CM | POA: Diagnosis not present

## 2016-01-17 DIAGNOSIS — M792 Neuralgia and neuritis, unspecified: Secondary | ICD-10-CM

## 2016-01-17 DIAGNOSIS — Z01812 Encounter for preprocedural laboratory examination: Secondary | ICD-10-CM

## 2016-01-17 MED ORDER — CAPSAICIN-MENTHOL-METHYL SAL 0.025-1-12 % EX CREA: 1.0000 "application " | TOPICAL_CREAM | Freq: Every day | CUTANEOUS | 2 refills | Status: AC

## 2016-01-17 MED ORDER — METHYLPREDNISOLONE 4 MG PO TBPK: ORAL_TABLET | ORAL | 0 refills | Status: DC
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
## 2016-01-17 NOTE — Telephone Encounter (Addendum)
-----   Message from Asencion Islamitorya Stover, [place] sent at 01/17/2016  2:43 PM EDT ----- Regarding: Referral for MRI, EMG, and NCV Please provide referrals for patient to have an MRI, EMG, and Nerve Conduction study.   Dx : Right foot neuritis, unresolved. Rx : MRI right foot. EMG, NCV bilateral lower extremities. [facility]

## 2016-01-20 ENCOUNTER — Telehealth: Payer: Self-pay | Admitting: Sports Medicine

## 2016-01-20 NOTE — Telephone Encounter (Signed)
I spoke with pt's mtr told her to rinse with milk or cool water and cover with light coating of Neosporin with Lidocaine.

## 2016-01-20 NOTE — Telephone Encounter (Signed)
Pt's mother called in stating the med that Dr. Marylene LandStover gave her a cream is making her foot feel as if it is on fire. Doesn't know whats to do she has tried to wash it off but it still is burning. Please call pt and advise

## 2016-02-07 ENCOUNTER — Ambulatory Visit: Payer: BLUE CROSS/BLUE SHIELD | Admitting: Podiatry

## 2016-03-31 ENCOUNTER — Encounter: Payer: Self-pay | Admitting: Family Medicine

## 2016-04-22 ENCOUNTER — Ambulatory Visit (INDEPENDENT_AMBULATORY_CARE_PROVIDER_SITE_OTHER): Payer: BLUE CROSS/BLUE SHIELD | Admitting: Family Medicine

## 2016-04-22 ENCOUNTER — Encounter: Payer: Self-pay | Admitting: Family Medicine

## 2016-04-22 DIAGNOSIS — N926 Irregular menstruation, unspecified: Secondary | ICD-10-CM | POA: Insufficient documentation

## 2016-04-22 LAB — CBC WITH DIFFERENTIAL/PLATELET
Basophils Absolute: 0 10*3/uL (ref 0.0–0.1)
Basophils Relative: 0.3 % (ref 0.0–3.0)
EOS PCT: 0.6 % (ref 0.0–5.0)
Eosinophils Absolute: 0.1 10*3/uL (ref 0.0–0.7)
HEMATOCRIT: 40.4 % (ref 36.0–46.0)
HEMOGLOBIN: 13.7 g/dL (ref 12.0–15.0)
Lymphocytes Relative: 28.8 % (ref 12.0–46.0)
Lymphs Abs: 2.8 10*3/uL (ref 0.7–4.0)
MCHC: 34 g/dL (ref 30.0–36.0)
MCV: 87.6 fl (ref 78.0–100.0)
MONOS PCT: 3.9 % (ref 3.0–12.0)
Monocytes Absolute: 0.4 10*3/uL (ref 0.1–1.0)
Neutro Abs: 6.4 10*3/uL (ref 1.4–7.7)
Neutrophils Relative %: 66.4 % (ref 43.0–77.0)
Platelets: 300 10*3/uL (ref 150.0–400.0)
RBC: 4.61 Mil/uL (ref 3.87–5.11)
RDW: 12.5 % (ref 11.5–15.5)
WBC: 9.7 10*3/uL (ref 4.0–10.5)

## 2016-04-22 LAB — COMPREHENSIVE METABOLIC PANEL
ALBUMIN: 3.7 g/dL (ref 3.5–5.2)
ALT: 17 U/L (ref 0–35)
AST: 14 U/L (ref 0–37)
Alkaline Phosphatase: 71 U/L (ref 39–117)
BILIRUBIN TOTAL: 0.5 mg/dL (ref 0.2–1.2)
BUN: 13 mg/dL (ref 6–23)
CO2: 26 mEq/L (ref 19–32)
CREATININE: 0.75 mg/dL (ref 0.40–1.20)
Calcium: 9 mg/dL (ref 8.4–10.5)
Chloride: 105 mEq/L (ref 96–112)
GFR: 100.91 mL/min (ref >60.00)
Glucose, Bld: 81 mg/dL (ref 70–99)
POTASSIUM: 4 meq/L (ref 3.5–5.1)
Sodium: 139 mEq/L (ref 135–145)
TOTAL PROTEIN: 6.5 g/dL (ref 6.0–8.3)

## 2016-04-22 LAB — TSH: TSH: 1.77 u[IU]/mL (ref 0.35–4.50)

## 2016-04-22 MED ORDER — DROSPIRENONE-ETHINYL ESTRADIOL 3-0.02 MG PO TABS: 1.0000 | ORAL_TABLET | Freq: Every day | ORAL | 11 refills | Status: DC

## 2016-04-22 NOTE — Progress Notes (Signed)
   Subjective:   Patient ID: Natalie Wu, female    DOB: April 27, 1991, 25 y.o.   MRN: [id number]  Natalie Wu is a pleasant 25 y.o. year old female who presents to clinic today with Menorrhagia  on 04/22/2016  HPI:   Irregular periods- past two periods, periods are much heavier. Has been on same OCP for years.  She is not sure why her periods are heavier.  Still having one period per month.  She is a newlywed but they don't plan on having children soon. No current outpatient prescriptions on file prior to visit.   No current facility-administered medications on file prior to visit.     No Known Allergies  Past Medical History:  Diagnosis Date  . Iron deficiency anemia     Past Surgical History:  Procedure Laterality Date  . TONSILLECTOMY      No family history on file.  Social History   Social History  . Marital status: Single    Spouse name: N/A  . Number of children: N/A  . Years of education: N/A   Occupational History  . Not on file.   Social History Main Topics  . Smoking status: Never Smoker  . Smokeless tobacco: Never Used  . Alcohol use No  . Drug use: No  . Sexual activity: Not on file   Other Topics Concern  . Not on file   Social History Narrative   Single   Has twin sister, Programmer, applicationskaley   Senior in high school   Not sexually active   Wants to be physical therapist   The PMH, PSH, Social History, Family History, Medications, and allergies [facility], and have been updated if relevant.  Review of Systems  Constitutional: Negative.   HENT: Negative.   Endocrine: Negative.   Genitourinary: Positive for menstrual problem. Negative for decreased urine volume, pelvic pain, urgency and vaginal bleeding.  Musculoskeletal: Negative.   Neurological: Negative.   Hematological: Negative.   Psychiatric/Behavioral: Negative.   All other systems reviewed and are negative.      Objective:    BP 114/62   Pulse 80   Temp 98.4  F (36.9 C) (Oral)   Wt 174 lb (78.9 kg)   LMP 04/03/2016   SpO2 98%   BMI 30.34 kg/m    Physical Exam  Constitutional: She is oriented to person, place, and time. She appears well-developed and well-nourished. No distress.  HENT:  Head: Normocephalic.  Eyes: Conjunctivae are normal.  Cardiovascular: Normal rate.   Pulmonary/Chest: Effort normal.  Musculoskeletal: Normal range of motion.  Neurological: She is alert and oriented to person, place, and time. No cranial nerve deficit.  Skin: Skin is warm and dry. She is not diaphoretic.  Psychiatric: She has a normal mood and affect. Her behavior is normal. Judgment and thought content normal.  Nursing note and vitals reviewed.         Assessment & Plan:   Irregular periods - Plan: TSH, CBC with Differential/Platelet, Comprehensive metabolic panel No Follow-up on file.

## 2016-04-22 NOTE — Assessment & Plan Note (Signed)
New- discussed options. She would like to try a different OCP. eRx sent for zyaz.  She will keep me updated. Will also check labs today. Orders Placed This Encounter  Procedures  . TSH  . CBC with Differential/Platelet  . Comprehensive metabolic panel

## 2016-04-22 NOTE — Patient Instructions (Signed)
Great to see you. Happy Birthday!  Happy Thanksgiving!   We will call you with your lab results and you can view them online.   We are starting Yaz.

## 2016-04-27 ENCOUNTER — Inpatient Hospital Stay: Payer: BLUE CROSS/BLUE SHIELD

## 2016-04-27 ENCOUNTER — Inpatient Hospital Stay: Payer: BLUE CROSS/BLUE SHIELD | Admitting: Oncology

## 2016-04-27 ENCOUNTER — Other Ambulatory Visit: Payer: Self-pay | Admitting: Family Medicine

## 2016-05-12 ENCOUNTER — Encounter: Payer: Self-pay | Admitting: Family Medicine

## 2016-05-19 ENCOUNTER — Encounter: Payer: Self-pay | Admitting: Family Medicine

## 2016-05-29 ENCOUNTER — Ambulatory Visit: Payer: BLUE CROSS/BLUE SHIELD | Admitting: Family Medicine

## 2016-06-23 ENCOUNTER — Other Ambulatory Visit: Payer: Self-pay | Admitting: Family Medicine

## 2016-07-01 ENCOUNTER — Other Ambulatory Visit: Payer: Self-pay | Admitting: Oncology

## 2016-07-01 NOTE — Progress Notes (Signed)
[facility]  Telephone:(336) [phone] [phone]) [phone]  ID: Collene GobbleKelly Blanchard OB: [birth year]  MR#: [id number]  AOZ#:[id number]#:[id number]  Patient Care Team: Dianne Dunalia M Aron, MD as PCP - General  CHIEF COMPLAINT: Iron deficiency anemia.  INTERVAL HISTORY:  Patient returns to clinic today for repeat laboratory work and further evaluation. She continues to feel well and remains asymptomatic. She does not complain of weakness or fatigue. She has no neurologic complaints. She denies any recent fevers. She has no chest pain or shortness of breath. She denies any nausea, vomiting, constipation, or diarrhea. She has no urinary complaints.  Patient offers no specific complaints today.    REVIEW OF SYSTEMS:   Review of Systems  Constitutional: Negative for fever, malaise/fatigue and weight loss.  Respiratory: Negative.  Negative for shortness of breath.   Cardiovascular: Negative.  Negative for chest pain.  Gastrointestinal: Negative.  Negative for blood in stool and melena.  Genitourinary: Negative.   Musculoskeletal: Negative.   Neurological: Negative.  Negative for weakness.    As per HPI. Otherwise, a complete review of systems is negative.  PAST MEDICAL HISTORY: Past Medical History:  Diagnosis Date  . Iron deficiency anemia     PAST SURGICAL HISTORY: Past Surgical History:  Procedure Laterality Date  . TONSILLECTOMY      FAMILY HISTORY: Positive for lung cancer, breast cancer. Also diabetes, heart disease, hypertension, and "liver disease".     ADVANCED DIRECTIVES:    HEALTH MAINTENANCE: Social History  Substance Use Topics  . Smoking status: Never Smoker  . Smokeless tobacco: Never Used  . Alcohol use No     Colonoscopy:  PAP:  Bone density:  Lipid panel:  No Known Allergies  Current Outpatient Prescriptions  Medication Sig Dispense Refill  . Norgestimate-Ethinyl Estradiol Triphasic 0.18/0.215/0.25 MG-35 MCG tablet Take 1 tablet by mouth daily.  COMPLETE PHYSICAL EXAM REQUIRED FOR ADDITIONAL REFILLS 1 Package 0   No current facility-administered medications for this visit.     OBJECTIVE: Vitals:   07/02/16 1422  BP: 129/86  Pulse: 75  Temp: 97.4 F (36.3 C)     Body mass index is 30.27 kg/m.    ECOG FS:0 - Asymptomatic  General: Well-developed, well-nourished, no acute distress. Eyes: Pink conjunctiva, anicteric sclera. Lungs: Clear to auscultation bilaterally. Heart: Regular rate and rhythm. No rubs, murmurs, or gallops. Abdomen: Soft, nontender, nondistended. No organomegaly noted, normoactive bowel sounds. Musculoskeletal: No edema, cyanosis, or clubbing. Neuro: Alert, answering all questions appropriately. Cranial nerves grossly intact. Skin: No rashes or petechiae noted. Psych: Normal affect.   LAB RESULTS:  Lab Results  Component Value Date   NA 139 04/22/2016   K 4.0 04/22/2016   CL 105 04/22/2016   CO2 26 04/22/2016   GLUCOSE 81 04/22/2016   BUN 13 04/22/2016   CREATININE 0.75 04/22/2016   CALCIUM 9.0 04/22/2016   PROT 6.5 04/22/2016   ALBUMIN 3.7 04/22/2016   AST 14 04/22/2016   ALT 17 04/22/2016   ALKPHOS 71 04/22/2016   BILITOT 0.5 04/22/2016    Lab Results  Component Value Date   WBC 8.5 07/02/2016   NEUTROABS 4.3 07/02/2016   HGB 14.1 07/02/2016   HCT 41.4 07/02/2016   MCV 86.1 07/02/2016   PLT 301 07/02/2016   Lab Results  Component Value Date   IRON 92 07/02/2016   TIBC 493 (H) 07/02/2016   IRONPCTSAT 19 07/02/2016    Lab Results  Component Value Date   FERRITIN 22 07/02/2016     STUDIES: No  results found.  ASSESSMENT: Iron deficiency anemia.  PLAN:    1. Iron deficiency anemia: Patient's hemoglobin and iron stores continue to be within normal limits. Patient has stated she cannot tolerate oral iron supplementation. Previously, the remainder of her laboratory work is either negative or within normal limits. She does not require additional Feraheme today. Patient last  received Feraheme in February 2017. After discussion with the patient, it was agreed upon that no further follow-up is necessary. Patient has been instructed to ensure that her labs are routinely checked and she can be referred back if there are any questions or concerns.  2. B12 deficiency: Mild, monitor.  Patient expressed understanding and was in agreement with this plan. She also understands that She can call clinic at any time with any questions, concerns, or complaints.   Jeralyn Ruths, MD   07/05/2016 8:53 AM

## 2016-07-02 ENCOUNTER — Inpatient Hospital Stay: Payer: BLUE CROSS/BLUE SHIELD

## 2016-07-02 ENCOUNTER — Encounter: Payer: Self-pay | Admitting: Family Medicine

## 2016-07-02 ENCOUNTER — Inpatient Hospital Stay: Payer: BLUE CROSS/BLUE SHIELD | Attending: Oncology

## 2016-07-02 ENCOUNTER — Inpatient Hospital Stay (HOSPITAL_BASED_OUTPATIENT_CLINIC_OR_DEPARTMENT_OTHER): Payer: BLUE CROSS/BLUE SHIELD | Admitting: Oncology

## 2016-07-02 VITALS — BP 129/86 | HR 75 | Temp 97.4°F | Ht 63.0 in | Wt 170.9 lb

## 2016-07-02 DIAGNOSIS — E538 Deficiency of other specified B group vitamins: Secondary | ICD-10-CM | POA: Insufficient documentation

## 2016-07-02 DIAGNOSIS — D509 Iron deficiency anemia, unspecified: Secondary | ICD-10-CM

## 2016-07-02 DIAGNOSIS — Z79899 Other long term (current) drug therapy: Secondary | ICD-10-CM | POA: Diagnosis not present

## 2016-07-02 DIAGNOSIS — D508 Other iron deficiency anemias: Secondary | ICD-10-CM

## 2016-07-02 LAB — IRON AND TIBC
IRON: 92 ug/dL (ref 28–170)
Saturation Ratios: 19 % (ref 10.4–31.8)
TIBC: 493 ug/dL — AB (ref 250–450)
UIBC: 401 ug/dL

## 2016-07-02 LAB — FERRITIN: Ferritin: 22 ng/mL (ref 11–307)

## 2016-07-02 LAB — CBC WITH DIFFERENTIAL/PLATELET
BASOS ABS: 0 10*3/uL (ref 0–0.1)
BASOS PCT: 0 %
EOS ABS: 0.1 10*3/uL (ref 0–0.7)
EOS PCT: 2 %
HCT: 41.4 % (ref 35.0–47.0)
HEMOGLOBIN: 14.1 g/dL (ref 12.0–16.0)
LYMPHS ABS: 3.6 10*3/uL (ref 1.0–3.6)
Lymphocytes Relative: 43 %
MCH: 29.4 pg (ref 26.0–34.0)
MCHC: 34.1 g/dL (ref 32.0–36.0)
MCV: 86.1 fL (ref 80.0–100.0)
Monocytes Absolute: 0.4 10*3/uL (ref 0.2–0.9)
Monocytes Relative: 4 %
NEUTROS PCT: 51 %
Neutro Abs: 4.3 10*3/uL (ref 1.4–6.5)
PLATELETS: 301 10*3/uL (ref 150–440)
RBC: 4.81 MIL/uL (ref 3.80–5.20)
RDW: 12.4 % (ref 11.5–14.5)
WBC: 8.5 10*3/uL (ref 3.6–11.0)

## 2016-07-02 NOTE — Progress Notes (Signed)
Patient here for follow up. No changes since last appointment.  

## 2016-07-02 NOTE — Progress Notes (Signed)
No treatment per Hayley. LJ

## 2016-08-03 ENCOUNTER — Other Ambulatory Visit: Payer: Self-pay | Admitting: Family Medicine

## 2016-08-03 ENCOUNTER — Encounter: Payer: Self-pay | Admitting: Family Medicine

## 2016-08-03 MED ORDER — NORGESTIM-ETH ESTRAD TRIPHASIC 0.18/0.215/0.25 MG-35 MCG PO TABS: 1.0000 | ORAL_TABLET | Freq: Every day | ORAL | 11 refills | Status: DC

## 2016-09-14 ENCOUNTER — Encounter: Payer: Self-pay | Admitting: Family Medicine

## 2016-09-27 ENCOUNTER — Other Ambulatory Visit: Payer: Self-pay | Admitting: Certified Nurse Midwife

## 2016-09-28 ENCOUNTER — Telehealth: Payer: Self-pay

## 2016-09-28 NOTE — Telephone Encounter (Signed)
Pt wants to know if Dr Deborra Medina can refill BC pill or does pt  Need to contact OB GYN. Pt has not seen Dr Deborra Medina since 11/07/2014 and OB GYN prescribed the [facility] pill.. Pt voiced understanding to contact OB GYN office.

## 2016-09-30 ENCOUNTER — Telehealth: Payer: Self-pay | Admitting: Certified Nurse Midwife

## 2016-09-30 MED ORDER — NORETHIN ACE-ETH ESTRAD-FE 1-20 MG-MCG(24) PO TABS: 1.0000 | ORAL_TABLET | Freq: Every day | ORAL | 0 refills | Status: DC

## 2016-09-30 NOTE — Telephone Encounter (Signed)
Patient scheduled her annual for May 29th,  However she will need a refill on her bc to last until then.   Please advise  [phone] Pharm  [facility] [place]

## 2016-09-30 NOTE — Telephone Encounter (Signed)
Pt aware rx sent in for one month.

## 2016-10-12 ENCOUNTER — Ambulatory Visit (INDEPENDENT_AMBULATORY_CARE_PROVIDER_SITE_OTHER): Payer: BLUE CROSS/BLUE SHIELD | Admitting: Family Medicine

## 2016-10-12 ENCOUNTER — Encounter: Payer: Self-pay | Admitting: Family Medicine

## 2016-10-12 VITALS — BP 100/70 | HR 82 | Ht 63.25 in | Wt 135.0 lb

## 2016-10-12 DIAGNOSIS — Z01419 Encounter for gynecological examination (general) (routine) without abnormal findings: Secondary | ICD-10-CM | POA: Insufficient documentation

## 2016-10-12 MED ORDER — BLISOVI 24 FE 1-20 MG-MCG(24) PO TABS: 1.0000 | ORAL_TABLET | Freq: Every day | ORAL | 11 refills | Status: DC

## 2016-10-12 NOTE — Assessment & Plan Note (Signed)
Reviewed preventive care protocols, scheduled due services, and updated immunizations Discussed nutrition, exercise, diet, and healthy lifestyle.  Labs today, she will return for breast/pelvic exam and pap smear.

## 2016-10-12 NOTE — Progress Notes (Signed)
Subjective:   Patient ID: [name], female    DOB: 03/19/1991, 26 y.o.   MRN: [id number]  [name] is a pleasant 26 y.o. year old female who presents to clinic today with Annual Exam  on 10/12/2016  HPI:  Son is 63 years old, Donna Christen.  Doing well.  Sees OBGYN but wants me to take over GYN care.  On OCPs.  Asking to have them refilled today.  On her period, will return for pap smear.     No current outpatient prescriptions on file prior to visit.   No current facility-administered medications on file prior to visit.     No Known Allergies  Past Medical History:  Diagnosis Date  . Anemia   . Skin cancer     Past Surgical History:  Procedure Laterality Date  . TONSILLECTOMY     age 26    No family history on file.  Social History   Social History  . Marital status: Single    Spouse name: N/A  . Number of children: N/A  . Years of education: N/A   Occupational History  . Not on file.   Social History Main Topics  . Smoking status: Never Smoker  . Smokeless tobacco: Never Used  . Alcohol use No  . Drug use: No  . Sexual activity: Not on file   Other Topics Concern  . Not on file   Social History Narrative  . No narrative on file   The PMH, PSH, Social History, Family History, Medications, and allergies have been reviewed in [facility], and have been updated if relevant.  Review of Systems  Constitutional: Negative.   HENT: Negative.   Respiratory: Negative.   Cardiovascular: Negative.   Gastrointestinal: Negative.   Endocrine: Negative.   Genitourinary: Negative.   Musculoskeletal: Negative.   Allergic/Immunologic: Negative.   Neurological: Negative.   Hematological: Negative.   Psychiatric/Behavioral: Negative.   All other systems reviewed and are negative.      Objective:    BP 100/70   Pulse 82   Ht 5' 3.25" (1.607 m)   Wt 135 lb 0.1 oz (61.2 kg)   LMP 10/12/2016 Comment: PT IS CURRENTLY MENSTRUATING   SpO2 98%   BMI 23.73  kg/m    Physical Exam   General:  Well-developed,well-nourished,in no acute distress; alert,appropriate and cooperative throughout examination Head:  normocephalic and atraumatic.   Eyes:  vision grossly intact, PERRL Ears:  R ear normal and L ear normal externally, TMs clear bilaterally Nose:  no external deformity.   Mouth:  good dentition.   Neck:  No deformities, masses, or tenderness noted. Breasts:  No mass, nodules, thickening, tenderness, bulging, retraction, inflamation, nipple discharge or skin changes noted.   Lungs:  Normal respiratory effort, chest expands symmetrically. Lungs are clear to auscultation, no crackles or wheezes. Heart:  Normal rate and regular rhythm. S1 and S2 normal without gallop, murmur, click, rub or other extra sounds. Abdomen:  Bowel sounds positive,abdomen soft and non-tender without masses, organomegaly or hernias noted. Msk:  No deformity or scoliosis noted of thoracic or lumbar spine.   Extremities:  No clubbing, cyanosis, edema, or deformity noted with normal full range of motion of all joints.   Neurologic:  alert & oriented X3 and gait normal.   Skin:  Intact without suspicious lesions or rashes Cervical Nodes:  No lymphadenopathy noted Axillary Nodes:  No palpable lymphadenopathy Psych:  Cognition and judgment appear intact. Alert and cooperative with normal attention  span and concentration. No apparent delusions, illusions, hallucinations       Assessment & Plan:   Well woman exam - Plan: CBC with Differential/Platelet, Comprehensive metabolic panel, Lipid panel, TSH No Follow-up on file.

## 2016-10-13 LAB — COMPREHENSIVE METABOLIC PANEL
ALBUMIN: 4 g/dL (ref 3.5–5.2)
ALT: 17 U/L (ref 0–35)
AST: 17 U/L (ref 0–37)
Alkaline Phosphatase: 65 U/L (ref 39–117)
BUN: 9 mg/dL (ref 6–23)
CO2: 30 meq/L (ref 19–32)
Calcium: 9.7 mg/dL (ref 8.4–10.5)
Chloride: 104 mEq/L (ref 96–112)
Creatinine, Ser: 0.72 mg/dL (ref 0.40–1.20)
GFR: 105.35 mL/min (ref >60.00)
Glucose, Bld: 64 mg/dL — ABNORMAL LOW (ref 70–99)
POTASSIUM: 4.4 meq/L (ref 3.5–5.1)
Sodium: 139 mEq/L (ref 135–145)
TOTAL PROTEIN: 6.8 g/dL (ref 6.0–8.3)
Total Bilirubin: 0.3 mg/dL (ref 0.2–1.2)

## 2016-10-13 LAB — CBC WITH DIFFERENTIAL/PLATELET
BASOS ABS: 0.1 10*3/uL (ref 0.0–0.1)
Basophils Relative: 1.1 % (ref 0.0–3.0)
EOS PCT: 1.6 % (ref 0.0–5.0)
Eosinophils Absolute: 0.2 10*3/uL (ref 0.0–0.7)
HEMATOCRIT: 39.7 % (ref 36.0–46.0)
HEMOGLOBIN: 13.4 g/dL (ref 12.0–15.0)
LYMPHS PCT: 33.1 % (ref 12.0–46.0)
Lymphs Abs: 3.5 10*3/uL (ref 0.7–4.0)
MCHC: 33.8 g/dL (ref 30.0–36.0)
MCV: 88.9 fl (ref 78.0–100.0)
MONOS PCT: 5.1 % (ref 3.0–12.0)
Monocytes Absolute: 0.5 10*3/uL (ref 0.1–1.0)
Neutro Abs: 6.2 10*3/uL (ref 1.4–7.7)
Neutrophils Relative %: 59.1 % (ref 43.0–77.0)
Platelets: 340 10*3/uL (ref 150.0–400.0)
RBC: 4.46 Mil/uL (ref 3.87–5.11)
RDW: 12.4 % (ref 11.5–15.5)
WBC: 10.5 10*3/uL (ref 4.0–10.5)

## 2016-10-13 LAB — LIPID PANEL
CHOL/HDL RATIO: 4
CHOLESTEROL: 192 mg/dL (ref 0–200)
HDL: 51.9 mg/dL (ref >39.00)
LDL CALC: 119 mg/dL — AB (ref 0–99)
NonHDL: 140.51
Triglycerides: 109 mg/dL (ref 0.0–149.0)
VLDL: 21.8 mg/dL (ref 0.0–40.0)

## 2016-10-13 LAB — TSH: TSH: 2.32 u[IU]/mL (ref 0.35–4.50)

## 2016-10-14 ENCOUNTER — Encounter: Payer: Self-pay | Admitting: *Deleted

## 2016-10-27 ENCOUNTER — Ambulatory Visit: Payer: Self-pay | Admitting: Certified Nurse Midwife

## 2016-12-29 ENCOUNTER — Encounter: Payer: Self-pay | Admitting: Family Medicine

## 2017-03-16 NOTE — Progress Notes (Signed)
[facility]  Telephone:(336) [phone] Fax:(336) [phone]  ID: Ellwood Handler OB: 1990/07/20  MR#: [id number]  WPY#:[id number]  Patient Care Team: Lucille Passy, MD as PCP - General  CHIEF COMPLAINT: Iron deficiency anemia.  INTERVAL HISTORY: Patient last evaluated in clinic in July 2017. She returns to clinic today as feeling increasing weakness and fatigue over the past several weeks. Patient states that she will sleep 8-9 hours a night and still wake-up exhausted the next day. She otherwise feels well. She has no neurologic complaints. She denies any chest pain or shortness of breath. She denies any nausea, vomiting, constipation, or diarrhea. She has no melena or hematochezia. She has no urinary complaints. Patient offers no further specific complaints today.  REVIEW OF SYSTEMS:   Review of Systems  Constitutional: Positive for malaise/fatigue. Negative for fever and weight loss.  Respiratory: Negative.  Negative for cough and shortness of breath.   Cardiovascular: Negative.  Negative for chest pain and leg swelling.  Gastrointestinal: Negative.  Negative for abdominal pain, blood in stool and melena.  Genitourinary: Negative.  Negative for hematuria.  Musculoskeletal: Negative.   Skin: Negative.  Negative for rash.  Neurological: Positive for weakness.  Psychiatric/Behavioral: Negative.  The patient is not nervous/anxious.     As per HPI. Otherwise, a complete review of systems is negative.  PAST MEDICAL HISTORY: Past Medical History:  Diagnosis Date  . Anemia   . Skin cancer     PAST SURGICAL HISTORY: Past Surgical History:  Procedure Laterality Date  . TONSILLECTOMY     age 4    FAMILY HISTORY: Sister with iron deficiency anemia, otherwise negative and noncontributory.     ADVANCED DIRECTIVES:    HEALTH MAINTENANCE: Social History  Substance Use Topics  . Smoking status: Never Smoker  . Smokeless tobacco: Never Used  . Alcohol use No      Colonoscopy:  PAP:  Bone density:  Lipid panel:  No Known Allergies  Current Outpatient Prescriptions  Medication Sig Dispense Refill  . BLISOVI 24 FE 1-20 MG-MCG(24) tablet Take 1 tablet by mouth daily. 1 Package 11   No current facility-administered medications for this visit.     OBJECTIVE: Vitals:   03/18/17 1017  BP: 112/77  Pulse: 88  Resp: 16  Temp: 97.8 F (36.6 C)     Body mass index is 22.39 kg/m.    ECOG FS:0 - Asymptomatic  General: Well-developed, well-nourished, no acute distress. Eyes: Pink conjunctiva, anicteric sclera. Lungs: Clear to auscultation bilaterally. Heart: Regular rate and rhythm. No rubs, murmurs, or gallops. Abdomen: Nontender, normoactive bowel sounds. Musculoskeletal: No edema, cyanosis, or clubbing. Neuro: Alert, answering all questions appropriately. Cranial nerves grossly intact. Skin: No rashes or petechiae noted. Psych: Normal affect.   LAB RESULTS:  Lab Results  Component Value Date   NA 139 10/12/2016   K 4.4 10/12/2016   CL 104 10/12/2016   CO2 30 10/12/2016   GLUCOSE 64 (L) 10/12/2016   BUN 9 10/12/2016   CREATININE 0.72 10/12/2016   CALCIUM 9.7 10/12/2016   PROT 6.8 10/12/2016   ALBUMIN 4.0 10/12/2016   AST 17 10/12/2016   ALT 17 10/12/2016   ALKPHOS 65 10/12/2016   BILITOT 0.3 10/12/2016   GFRNONAA >60 05/11/2015   GFRAA >60 05/11/2015    Lab Results  Component Value Date   WBC 6.6 03/18/2017   NEUTROABS 4.6 03/18/2017   HGB 14.0 03/18/2017   HCT 40.7 03/18/2017   MCV 90.9 03/18/2017   PLT  226 03/18/2017   Lab Results  Component Value Date   IRON 52 12/25/2015   TIBC 585 (H) 12/25/2015   IRONPCTSAT 9 (L) 12/25/2015    Lab Results  Component Value Date   FERRITIN 4 (L) 12/25/2015     STUDIES: No results found.  ASSESSMENT: Iron deficiency anemia  PLAN:    1. Iron deficiency anemia: Patient's hemoglobin has improved and is well within normal limits at 14.0. Her iron stores are  pending at time of dictation. She does not require IV Feraheme today. She has been instructed to continue her oral iron supplementation. She last received treatment on January 16, 2016. No intervention is needed. Return to clinic in 6 months with repeat laboratory work and further evaluation. 2. Weakness and fatigue: Unlikely related to iron deficiency anemia. Patient agreed to return to the lab to check thyroid panel, B-12, and folate. These are pending at time of dictation.   Approximately 20 minutes was spent in discussion of which greater than 50% was consultation.  Patient expressed understanding and was in agreement with this plan. She also understands that She can call clinic at any time with any questions, concerns, or complaints.    Lloyd Huger, MD   03/18/2017 10:50 AM

## 2017-03-17 ENCOUNTER — Other Ambulatory Visit: Payer: Self-pay | Admitting: *Deleted

## 2017-03-17 DIAGNOSIS — D649 Anemia, unspecified: Secondary | ICD-10-CM

## 2017-03-18 ENCOUNTER — Inpatient Hospital Stay (HOSPITAL_BASED_OUTPATIENT_CLINIC_OR_DEPARTMENT_OTHER): Payer: BLUE CROSS/BLUE SHIELD | Admitting: Oncology

## 2017-03-18 ENCOUNTER — Inpatient Hospital Stay: Payer: BLUE CROSS/BLUE SHIELD | Attending: Oncology

## 2017-03-18 VITALS — BP 112/77 | HR 88 | Temp 97.8°F | Resp 16 | Wt 127.4 lb

## 2017-03-18 DIAGNOSIS — D509 Iron deficiency anemia, unspecified: Secondary | ICD-10-CM | POA: Diagnosis not present

## 2017-03-18 DIAGNOSIS — D508 Other iron deficiency anemias: Secondary | ICD-10-CM

## 2017-03-18 DIAGNOSIS — R531 Weakness: Secondary | ICD-10-CM | POA: Insufficient documentation

## 2017-03-18 DIAGNOSIS — Z793 Long term (current) use of hormonal contraceptives: Secondary | ICD-10-CM | POA: Insufficient documentation

## 2017-03-18 DIAGNOSIS — Z85828 Personal history of other malignant neoplasm of skin: Secondary | ICD-10-CM | POA: Diagnosis not present

## 2017-03-18 DIAGNOSIS — R5383 Other fatigue: Secondary | ICD-10-CM

## 2017-03-18 DIAGNOSIS — D649 Anemia, unspecified: Secondary | ICD-10-CM

## 2017-03-18 LAB — FERRITIN: FERRITIN: 77 ng/mL (ref 11–307)

## 2017-03-18 LAB — CBC WITH DIFFERENTIAL/PLATELET
BASOS PCT: 1 %
Basophils Absolute: 0 10*3/uL (ref 0–0.1)
Eosinophils Absolute: 0 10*3/uL (ref 0–0.7)
Eosinophils Relative: 0 %
HCT: 40.7 % (ref 35.0–47.0)
HEMOGLOBIN: 14 g/dL (ref 12.0–16.0)
LYMPHS PCT: 22 %
Lymphs Abs: 1.4 10*3/uL (ref 1.0–3.6)
MCH: 31.2 pg (ref 26.0–34.0)
MCHC: 34.3 g/dL (ref 32.0–36.0)
MCV: 90.9 fL (ref 80.0–100.0)
MONO ABS: 0.5 10*3/uL (ref 0.2–0.9)
MONOS PCT: 8 %
NEUTROS ABS: 4.6 10*3/uL (ref 1.4–6.5)
NEUTROS PCT: 69 %
PLATELETS: 226 10*3/uL (ref 150–440)
RBC: 4.48 MIL/uL (ref 3.80–5.20)
RDW: 12.7 % (ref 11.5–14.5)
WBC: 6.6 10*3/uL (ref 3.6–11.0)

## 2017-03-18 LAB — IRON AND TIBC
IRON: 35 ug/dL (ref 28–170)
Saturation Ratios: 9 % — ABNORMAL LOW (ref 10.4–31.8)
TIBC: 382 ug/dL (ref 250–450)
UIBC: 348 ug/dL

## 2017-03-18 LAB — VITAMIN B12: Vitamin B-12: 156 pg/mL — ABNORMAL LOW (ref 180–914)

## 2017-03-18 LAB — FOLATE: Folate: 9.6 ng/mL (ref >5.9)

## 2017-03-18 NOTE — Progress Notes (Signed)
Pt in today for follow of anemia.  Pt reports feeling tired and wants to sleep all the time.

## 2017-03-19 LAB — THYROID PANEL WITH TSH
FREE THYROXINE INDEX: 1.7 (ref 1.2–4.9)
T3 Uptake Ratio: 21 % — ABNORMAL LOW (ref 24–39)
T4, Total: 8.2 ug/dL (ref 4.5–12.0)
TSH: 1.42 u[IU]/mL (ref 0.450–4.500)

## 2017-03-25 ENCOUNTER — Encounter: Payer: Self-pay | Admitting: Family Medicine

## 2017-03-25 ENCOUNTER — Ambulatory Visit (INDEPENDENT_AMBULATORY_CARE_PROVIDER_SITE_OTHER): Payer: BLUE CROSS/BLUE SHIELD | Admitting: Family Medicine

## 2017-03-25 VITALS — BP 106/72 | HR 91 | Temp 98.2°F | Ht 63.0 in | Wt 166.8 lb

## 2017-03-25 DIAGNOSIS — D508 Other iron deficiency anemias: Secondary | ICD-10-CM | POA: Diagnosis not present

## 2017-03-25 LAB — CBC WITH DIFFERENTIAL/PLATELET
BASOS PCT: 0.3 % (ref 0.0–3.0)
Basophils Absolute: 0 10*3/uL (ref 0.0–0.1)
EOS PCT: 1.8 % (ref 0.0–5.0)
Eosinophils Absolute: 0.1 10*3/uL (ref 0.0–0.7)
HEMATOCRIT: 41.7 % (ref 36.0–46.0)
Hemoglobin: 13.6 g/dL (ref 12.0–15.0)
LYMPHS ABS: 2.3 10*3/uL (ref 0.7–4.0)
LYMPHS PCT: 34.4 % (ref 12.0–46.0)
MCHC: 32.7 g/dL (ref 30.0–36.0)
MCV: 89.7 fl (ref 78.0–100.0)
MONOS PCT: 5.5 % (ref 3.0–12.0)
Monocytes Absolute: 0.4 10*3/uL (ref 0.1–1.0)
NEUTROS ABS: 3.9 10*3/uL (ref 1.4–7.7)
NEUTROS PCT: 58 % (ref 43.0–77.0)
PLATELETS: 310 10*3/uL (ref 150.0–400.0)
RBC: 4.65 Mil/uL (ref 3.87–5.11)
RDW: 13.9 % (ref 11.5–15.5)
WBC: 6.6 10*3/uL (ref 4.0–10.5)

## 2017-03-25 MED ORDER — NORGESTIM-ETH ESTRAD TRIPHASIC 0.18/0.215/0.25 MG-35 MCG PO TABS: 1.0000 | ORAL_TABLET | Freq: Every day | ORAL | 11 refills | Status: DC

## 2017-03-25 NOTE — Assessment & Plan Note (Signed)
Symptomatic. Will likely require IV iron again. Check labs today. The patient indicates understanding of these issues and agrees with the plan. Orders Placed This Encounter  Procedures  . CBC with Differential/Platelet  . Iron and TIBC

## 2017-03-25 NOTE — Progress Notes (Signed)
Subjective:   Patient ID: Natalie Wu, female    DOB: 02/07/1991, 26 y.o.   MRN: [id number]  Natalie Wu is a pleasant 26 y.o. year old female who presents to clinic today with Follow-up (Patient is here today for a follow-up.  She feels that her Fe is low again.)  on 03/25/2017  HPI:  H/o iron deficiency anemia- was taking oral iron 325 mg daily but stopped taking it last year because it was causing GI upset/constipation.   Was referred to hematology, Dr. Francee PiccoloFennigan and work up was negative.  Twin sister was referred for same issue and has had issues with iron deficiency anemia since she was a small child.   Has received several courses of IV iron in the past, most recently 2017. Denies any SOB, but she is very tired.   Lab Results  Component Value Date   WBC 8.5 07/02/2016   HGB 14.1 07/02/2016   HCT 41.4 07/02/2016   MCV 86.1 07/02/2016   PLT 301 07/02/2016   No current outpatient prescriptions on file prior to visit.   No current facility-administered medications on file prior to visit.     No Known Allergies  Past Medical History:  Diagnosis Date  . Iron deficiency anemia     Past Surgical History:  Procedure Laterality Date  . TONSILLECTOMY      No family history on file.  Social History   Social History  . Marital status: Single    Spouse name: N/A  . Number of children: N/A  . Years of education: N/A   Occupational History  . Not on file.   Social History Main Topics  . Smoking status: Never Smoker  . Smokeless tobacco: Never Used  . Alcohol use No  . Drug use: No  . Sexual activity: Not on file   Other Topics Concern  . Not on file   Social History Narrative   Single   Has twin sister, Programmer, applicationskaley   Senior in high school   Not sexually active   Wants to be physical therapist   The PMH, PSH, Social History, Family History, Medications, and allergies have been reviewed in [facility], and have been updated if relevant.   Review of Systems    Constitutional: Positive for fatigue.  HENT: Negative.   Respiratory: Negative.   Cardiovascular: Negative.   Gastrointestinal: Positive for constipation.  Musculoskeletal: Negative.   Neurological: Negative.   Hematological: Negative.   All other systems reviewed and are negative.      Objective:    BP 106/72 (BP Location: Left Arm, Patient Position: Sitting, Cuff Size: Normal)   Pulse 91   Temp 98.2 F (36.8 C) (Oral)   Ht 5\' 3"  (1.6 m)   Wt 166 lb 12.8 oz (75.7 kg)   LMP 03/25/2017   SpO2 99%   BMI 29.55 kg/m    Physical Exam  Constitutional: She is oriented to person, place, and time. She appears well-developed and well-nourished. No distress.  HENT:  Head: Normocephalic.  Eyes: Conjunctivae are normal.  Cardiovascular: Normal rate and regular rhythm.   Pulmonary/Chest: Effort normal.  Musculoskeletal: Normal range of motion. She exhibits no edema.  Neurological: She is alert and oriented to person, place, and time. No cranial nerve deficit.  Skin: Skin is warm and dry. She is not diaphoretic.  Psychiatric: She has a normal mood and affect. Her behavior is normal. Judgment and thought content normal.  Nursing note and vitals reviewed.  Assessment & Plan:   Other iron deficiency anemia - Plan: CBC with Differential/Platelet, Iron and TIBC No Follow-up on file.

## 2017-03-25 NOTE — Patient Instructions (Signed)
Great to see you. We will call you with your lab results and you can view them online.  

## 2017-07-26 ENCOUNTER — Other Ambulatory Visit: Payer: Self-pay | Admitting: Family Medicine

## 2017-08-16 ENCOUNTER — Encounter: Payer: Self-pay | Admitting: Family Medicine

## 2017-08-17 ENCOUNTER — Telehealth: Payer: Self-pay

## 2017-08-17 NOTE — Telephone Encounter (Signed)
Pt needs appt for anxiety per Dr. Aron/I called pt and LMOVM stating to call and sched appt for this/she also wants to sched a CPE with PAP/thx dmf

## 2017-09-13 NOTE — Progress Notes (Deleted)
[facility]  Telephone:(336) [phone] Fax:(336) [phone]  ID: [name] OB: 1991/03/02  MR#: [id number]  IPJ#:[id number]  Patient Care Team: Lucille Passy, MD as PCP - General  CHIEF COMPLAINT: Iron deficiency anemia.  INTERVAL HISTORY: Patient last evaluated in clinic in July 2017. She returns to clinic today as feeling increasing weakness and fatigue over the past several weeks. Patient states that she will sleep 8-9 hours a night and still wake-up exhausted the next day. She otherwise feels well. She has no neurologic complaints. She denies any chest pain or shortness of breath. She denies any nausea, vomiting, constipation, or diarrhea. She has no melena or hematochezia. She has no urinary complaints. Patient offers no further specific complaints today.  REVIEW OF SYSTEMS:   Review of Systems  Constitutional: Positive for malaise/fatigue. Negative for fever and weight loss.  Respiratory: Negative.  Negative for cough and shortness of breath.   Cardiovascular: Negative.  Negative for chest pain and leg swelling.  Gastrointestinal: Negative.  Negative for abdominal pain, blood in stool and melena.  Genitourinary: Negative.  Negative for hematuria.  Musculoskeletal: Negative.   Skin: Negative.  Negative for rash.  Neurological: Positive for weakness.  Psychiatric/Behavioral: Negative.  The patient is not nervous/anxious.     As per HPI. Otherwise, a complete review of systems is negative.  PAST MEDICAL HISTORY: Past Medical History:  Diagnosis Date  . Anemia   . Skin cancer     PAST SURGICAL HISTORY: Past Surgical History:  Procedure Laterality Date  . TONSILLECTOMY     age 2    FAMILY HISTORY: Sister with iron deficiency anemia, otherwise negative and noncontributory.     ADVANCED DIRECTIVES:    HEALTH MAINTENANCE: Social History   Tobacco Use  . Smoking status: Never Smoker  . Smokeless tobacco: Never Used  Substance Use Topics  .  Alcohol use: No    Alcohol/week: 0.0 oz  . Drug use: No     Colonoscopy:  PAP:  Bone density:  Lipid panel:  No Known Allergies  Current Outpatient Medications  Medication Sig Dispense Refill  . BLISOVI 24 FE 1-20 MG-MCG(24) tablet TAKE 1 TABLET BY MOUTH EVERY DAY 28 tablet 3   No current facility-administered medications for this visit.     OBJECTIVE: There were no vitals filed for this visit.   There is no height or weight on file to calculate BMI.    ECOG FS:0 - Asymptomatic  General: Well-developed, well-nourished, no acute distress. Eyes: Pink conjunctiva, anicteric sclera. Lungs: Clear to auscultation bilaterally. Heart: Regular rate and rhythm. No rubs, murmurs, or gallops. Abdomen: Nontender, normoactive bowel sounds. Musculoskeletal: No edema, cyanosis, or clubbing. Neuro: Alert, answering all questions appropriately. Cranial nerves grossly intact. Skin: No rashes or petechiae noted. Psych: Normal affect.   LAB RESULTS:  Lab Results  Component Value Date   NA 139 10/12/2016   K 4.4 10/12/2016   CL 104 10/12/2016   CO2 30 10/12/2016   GLUCOSE 64 (L) 10/12/2016   BUN 9 10/12/2016   CREATININE 0.72 10/12/2016   CALCIUM 9.7 10/12/2016   PROT 6.8 10/12/2016   ALBUMIN 4.0 10/12/2016   AST 17 10/12/2016   ALT 17 10/12/2016   ALKPHOS 65 10/12/2016   BILITOT 0.3 10/12/2016   GFRNONAA >60 05/11/2015   GFRAA >60 05/11/2015    Lab Results  Component Value Date   WBC 6.6 03/18/2017   NEUTROABS 4.6 03/18/2017   HGB 14.0 03/18/2017   HCT 40.7 03/18/2017  MCV 90.9 03/18/2017   PLT 226 03/18/2017   Lab Results  Component Value Date   IRON 35 03/18/2017   TIBC 382 03/18/2017   IRONPCTSAT 9 (L) 03/18/2017    Lab Results  Component Value Date   FERRITIN 77 03/18/2017     STUDIES: No results found.  ASSESSMENT: Iron deficiency anemia  PLAN:    1. Iron deficiency anemia: Patient's hemoglobin has improved and is well within normal limits at 14.0.  Her iron stores are pending at time of dictation. She does not require IV Feraheme today. She has been instructed to continue her oral iron supplementation. She last received treatment on January 16, 2016. No intervention is needed. Return to clinic in 6 months with repeat laboratory work and further evaluation. 2. Weakness and fatigue: Unlikely related to iron deficiency anemia. Patient agreed to return to the lab to check thyroid panel, B-12, and folate. These are pending at time of dictation.   Approximately 20 minutes was spent in discussion of which greater than 50% was consultation.  Patient expressed understanding and was in agreement with this plan. She also understands that She can call clinic at any time with any questions, concerns, or complaints.    Lloyd Huger, MD   09/13/2017 12:08 AM

## 2017-09-15 ENCOUNTER — Encounter: Payer: Self-pay | Admitting: Family Medicine

## 2017-09-15 ENCOUNTER — Ambulatory Visit: Payer: BLUE CROSS/BLUE SHIELD | Admitting: Family Medicine

## 2017-09-15 VITALS — BP 116/76 | HR 86 | Temp 98.7°F | Ht 63.0 in | Wt 139.8 lb

## 2017-09-15 DIAGNOSIS — Z23 Encounter for immunization: Secondary | ICD-10-CM | POA: Diagnosis not present

## 2017-09-15 DIAGNOSIS — F339 Major depressive disorder, recurrent, unspecified: Secondary | ICD-10-CM | POA: Insufficient documentation

## 2017-09-15 DIAGNOSIS — F4329 Adjustment disorder with other symptoms: Secondary | ICD-10-CM | POA: Diagnosis not present

## 2017-09-15 MED ORDER — BUPROPION HCL ER (XL) 150 MG PO TB24: 150.0000 mg | ORAL_TABLET | Freq: Every day | ORAL | 3 refills | Status: DC

## 2017-09-15 NOTE — Progress Notes (Signed)
Subjective:   Patient ID: [name], female    DOB: 08/26/1990, 27 y.o.   MRN: [id number]  [name] is a pleasant 27 y.o. year old female who presents to clinic today with Depression (Patient is here today to discuss depression.  She also agrees to schedule a CPE with PAP otw out with fasting labs a week prior.)  on 09/15/2017  HPI:  ?Depression-  She is going through a tough time.  Her ex is taking her to court for custody of their 26 year old.  Currently the custody agreement is week to week- so that means she has every other week without him.  She is not used to being away from her son.  She is tearful, not sleeping well.  Does not want to get out of bed in the mornings.  No SI or HI.  No panic attacks.  Decreased appetite, not concentrating well at work.    Current Outpatient Medications on File Prior to Visit  Medication Sig Dispense Refill  . BLISOVI 24 FE 1-20 MG-MCG(24) tablet TAKE 1 TABLET BY MOUTH EVERY DAY 28 tablet 3   No current facility-administered medications on file prior to visit.     No Known Allergies  Past Medical History:  Diagnosis Date  . Anemia   . Skin cancer     Past Surgical History:  Procedure Laterality Date  . TONSILLECTOMY     age 13    No family history on file.  Social History   Socioeconomic History  . Marital status: Single    Spouse name: Not on file  . Number of children: Not on file  . Years of education: Not on file  . Highest education level: Not on file  Occupational History  . Not on file  Social Needs  . Financial resource strain: Not on file  . Food insecurity:    Worry: Not on file    Inability: Not on file  . Transportation needs:    Medical: Not on file    Non-medical: Not on file  Tobacco Use  . Smoking status: Never Smoker  . Smokeless tobacco: Never Used  Substance and Sexual Activity  . Alcohol use: No    Alcohol/week: 0.0 oz  . Drug use: No  . Sexual activity: Not on file  Lifestyle  .  Physical activity:    Days per week: Not on file    Minutes per session: Not on file  . Stress: Not on file  Relationships  . Social connections:    Talks on phone: Not on file    Gets together: Not on file    Attends religious service: Not on file    Active member of club or organization: Not on file    Attends meetings of clubs or organizations: Not on file    Relationship status: Not on file  . Intimate partner violence:    Fear of current or ex partner: Not on file    Emotionally abused: Not on file    Physically abused: Not on file    Forced sexual activity: Not on file  Other Topics Concern  . Not on file  Social History Narrative   Fiance   90 year old son, Donna Christen    The PMH, PSH, Social History, Family History, Medications, and allergies have been reviewed in [facility], and have been updated if relevant.   Review of Systems  Psychiatric/Behavioral: Positive for decreased concentration, dysphoric mood and sleep disturbance. Negative for  agitation, behavioral problems, confusion, hallucinations, self-injury and suicidal ideas. The patient is not nervous/anxious and is not hyperactive.   All other systems reviewed and are negative.      Objective:    BP 116/76 (BP Location: Left Arm, Patient Position: Sitting, Cuff Size: Normal)   Pulse 86   Temp 98.7 F (37.1 C) (Oral)   Ht 5\' 3"  (1.6 m)   Wt 139 lb 12.8 oz (63.4 kg)   LMP 09/14/2017 (Exact Date)   SpO2 98%   BMI 24.76 kg/m    Physical Exam  Constitutional: She is oriented to person, place, and time. She appears well-developed and well-nourished. No distress.  HENT:  Head: Normocephalic and atraumatic.  Eyes: EOM are normal.  Cardiovascular: Normal rate.  Pulmonary/Chest: Effort normal.  Neurological: She is alert and oriented to person, place, and time. No cranial nerve deficit.  Skin: Skin is warm. She is not diaphoretic.  Psychiatric: She has a normal mood and affect. Her behavior is normal. Judgment and  thought content normal.  Nursing note and vitals reviewed.         Assessment & Plan:   Need for Tdap vaccination - Plan: Tdap vaccine greater than or equal to 7yo IM  Depression, recurrent (HCC) No follow-ups on file.

## 2017-09-15 NOTE — Assessment & Plan Note (Signed)
>  25 minutes spent in face to face time with patient, >50% spent in counselling or coordination of care Discussed treatment. I suspect she downplayed her symptoms when she filled out the PHQ 9 survey as it was only 5 and upon talking with her,her symptoms are quite severe and impacting her quality of life. Deferring psychotherapy. She agrees to start rx- eRx sent for Wellbutrin 150 mg XL daily. She will follow up in 3-4 weeks. The patient indicates understanding of these issues and agrees with the plan.

## 2017-09-15 NOTE — Patient Instructions (Signed)
Great to see you. I am so sorry you are going through such a hard time.  We are starting Wellbutrin 150 mg XL daily.  Please update me in a few weeks.

## 2017-09-16 ENCOUNTER — Ambulatory Visit: Payer: BLUE CROSS/BLUE SHIELD

## 2017-09-16 ENCOUNTER — Ambulatory Visit: Payer: BLUE CROSS/BLUE SHIELD | Admitting: Oncology

## 2017-09-16 ENCOUNTER — Other Ambulatory Visit: Payer: BLUE CROSS/BLUE SHIELD

## 2017-09-20 ENCOUNTER — Ambulatory Visit: Payer: BLUE CROSS/BLUE SHIELD | Admitting: Family Medicine

## 2017-09-20 ENCOUNTER — Encounter: Payer: Self-pay | Admitting: Family Medicine

## 2017-09-20 VITALS — BP 122/76 | HR 79 | Temp 98.3°F | Ht 63.0 in | Wt 168.4 lb

## 2017-09-20 DIAGNOSIS — D649 Anemia, unspecified: Secondary | ICD-10-CM | POA: Diagnosis not present

## 2017-09-20 DIAGNOSIS — F418 Other specified anxiety disorders: Secondary | ICD-10-CM | POA: Insufficient documentation

## 2017-09-20 MED ORDER — ALPRAZOLAM 0.5 MG PO TABS: 0.5000 mg | ORAL_TABLET | Freq: Three times a day (TID) | ORAL | 0 refills | Status: DC | PRN
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
## 2017-09-20 NOTE — Patient Instructions (Signed)
Great to see you. I will call you with your lab results from today and you can view them online.   Xanax as needed for your long car ride.

## 2017-09-20 NOTE — Assessment & Plan Note (Signed)
New- discussed with pt that it will take re exposure to riding and driving again for her to overcome this.  It will take time. She has a long trip to [place] scheduled- she is a passenger for this.  She is very anxious about this trip-= rx for xanax given to pt to use for this trip and only when she is NOT driving. Call or return to clinic prn if these symptoms worsen or fail to improve as anticipated. The patient indicates understanding of these issues and agrees with the plan.

## 2017-09-21 ENCOUNTER — Encounter: Payer: Self-pay | Admitting: Family Medicine

## 2017-09-21 LAB — CBC WITH DIFFERENTIAL/PLATELET
BASOS PCT: 2.4 % (ref 0.0–3.0)
Basophils Absolute: 0.2 10*3/uL — ABNORMAL HIGH (ref 0.0–0.1)
EOS ABS: 0.1 10*3/uL (ref 0.0–0.7)
Eosinophils Relative: 1.3 % (ref 0.0–5.0)
HCT: 36.7 % (ref 36.0–46.0)
Hemoglobin: 12.2 g/dL (ref 12.0–15.0)
LYMPHS ABS: 3.3 10*3/uL (ref 0.7–4.0)
Lymphocytes Relative: 45.6 % (ref 12.0–46.0)
MCHC: 33.3 g/dL (ref 30.0–36.0)
MCV: 86.2 fl (ref 78.0–100.0)
MONO ABS: 0.5 10*3/uL (ref 0.1–1.0)
Monocytes Relative: 6.7 % (ref 3.0–12.0)
NEUTROS ABS: 3.2 10*3/uL (ref 1.4–7.7)
NEUTROS PCT: 44 % (ref 43.0–77.0)
PLATELETS: 310 10*3/uL (ref 150.0–400.0)
RBC: 4.26 Mil/uL (ref 3.87–5.11)
RDW: 13.3 % (ref 11.5–15.5)
WBC: 7.2 10*3/uL (ref 4.0–10.5)

## 2017-09-21 LAB — FERRITIN: FERRITIN: 4.5 ng/mL — AB (ref 10.0–291.0)

## 2017-09-22 ENCOUNTER — Other Ambulatory Visit: Payer: Self-pay

## 2017-09-22 DIAGNOSIS — D508 Other iron deficiency anemias: Secondary | ICD-10-CM

## 2017-09-22 NOTE — Progress Notes (Signed)
Placed referral to Hematology per TA/pt will be called with appt/thx dmf

## 2017-09-27 ENCOUNTER — Encounter: Payer: Self-pay | Admitting: Family Medicine

## 2017-09-28 ENCOUNTER — Inpatient Hospital Stay: Payer: BLUE CROSS/BLUE SHIELD | Attending: Oncology | Admitting: Oncology

## 2017-09-28 ENCOUNTER — Encounter: Payer: Self-pay | Admitting: Oncology

## 2017-09-28 VITALS — BP 122/88 | HR 98 | Temp 98.2°F | Resp 18 | Ht 63.0 in | Wt 169.5 lb

## 2017-09-28 DIAGNOSIS — D509 Iron deficiency anemia, unspecified: Secondary | ICD-10-CM | POA: Diagnosis not present

## 2017-09-28 NOTE — Progress Notes (Signed)
[facility]  [phone] [phone]  ID: Natalie Wu OB: May 30, 1991  MR#: [id number]  AOZ#:[id number]  Patient Care Team: Dianne Dun, MD as PCP - General  CHIEF COMPLAINT: Iron deficiency anemia.  INTERVAL HISTORY: Patient last evaluated in clinic on July 02, 2016.  She is referred back for declining iron stores.  She also states she is symptomatic with increased weakness and fatigue over the past several weeks.  She otherwise feels well and is asymptomatic.  She has no neurologic complaints.  She denies any recent fevers or illnesses.  She has a good appetite and denies weight loss.  She has no chest pain or shortness of breath.  She denies any nausea, vomiting, constipation, or diarrhea.  She has no urinary complaints.  Patient otherwise feels well and offers no further specific complaints today.  REVIEW OF SYSTEMS:   Review of Systems  Constitutional: Negative.  Negative for fever, malaise/fatigue and weight loss.  Respiratory: Negative.  Negative for cough and shortness of breath.   Cardiovascular: Negative.  Negative for chest pain and leg swelling.  Gastrointestinal: Negative.  Negative for abdominal pain, blood in stool and melena.  Genitourinary: Negative.  Negative for hematuria.  Musculoskeletal: Negative.   Neurological: Negative.  Negative for sensory change, focal weakness and weakness.  Psychiatric/Behavioral: Negative.  The patient is not nervous/anxious.     As per HPI. Otherwise, a complete review of systems is negative.  PAST MEDICAL HISTORY: Past Medical History:  Diagnosis Date  . Iron deficiency anemia     PAST SURGICAL HISTORY: Past Surgical History:  Procedure Laterality Date  . TONSILLECTOMY      FAMILY HISTORY: Positive for lung cancer, breast cancer. Also diabetes, heart disease, hypertension, and "liver disease".     ADVANCED DIRECTIVES:    HEALTH MAINTENANCE: Social History   Tobacco Use  .  Smoking status: Never Smoker  . Smokeless tobacco: Never Used  Substance Use Topics  . Alcohol use: No  . Drug use: No     Colonoscopy:  PAP:  Bone density:  Lipid panel:  No Known Allergies  Current Outpatient Medications  Medication Sig Dispense Refill  . ALPRAZolam (XANAX) 0.5 MG tablet Take 1 tablet (0.5 mg total) by mouth 3 (three) times daily as needed for anxiety. 20 tablet 0  . Norgestimate-Ethinyl Estradiol Triphasic (TRI-SPRINTEC) 0.18/0.215/0.25 MG-35 MCG tablet Take 1 tablet by mouth daily. 1 Package 11   No current facility-administered medications for this visit.     OBJECTIVE: Vitals:   09/28/17 1329  BP: 122/88  Pulse: 98  Resp: 18  Temp: 98.2 F (36.8 C)  SpO2: 99%     Body mass index is 30.03 kg/m.    ECOG FS:0 - Asymptomatic  General: Well-developed, well-nourished, no acute distress. Eyes: Pink conjunctiva, anicteric sclera. HEENT: Normocephalic, moist mucous membranes, clear oropharnyx. Lungs: Clear to auscultation bilaterally. Heart: Regular rate and rhythm. No rubs, murmurs, or gallops. Abdomen: Soft, nontender, nondistended. No organomegaly noted, normoactive bowel sounds. Musculoskeletal: No edema, cyanosis, or clubbing. Neuro: Alert, answering all questions appropriately. Cranial nerves grossly intact. Skin: No rashes or petechiae noted. Psych: Normal affect. Lymphatics: No cervical, calvicular, axillary or inguinal LAD.  LAB RESULTS:  Lab Results  Component Value Date   NA 139 04/22/2016   K 4.0 04/22/2016   CL 105 04/22/2016   CO2 26 04/22/2016   GLUCOSE 81 04/22/2016   BUN 13 04/22/2016   CREATININE 0.75 04/22/2016   CALCIUM 9.0 04/22/2016   PROT  6.5 04/22/2016   ALBUMIN 3.7 04/22/2016   AST 14 04/22/2016   ALT 17 04/22/2016   ALKPHOS 71 04/22/2016   BILITOT 0.5 04/22/2016    Lab Results  Component Value Date   WBC 7.2 09/20/2017   NEUTROABS 3.2 09/20/2017   HGB 12.2 09/20/2017   HCT 36.7 09/20/2017   MCV 86.2  09/20/2017   PLT 310.0 09/20/2017   Lab Results  Component Value Date   IRON 92 07/02/2016   TIBC 493 (H) 07/02/2016   IRONPCTSAT 19 07/02/2016    Lab Results  Component Value Date   FERRITIN 4.5 (L) 09/20/2017     STUDIES: No results found.  ASSESSMENT: Iron deficiency anemia.  PLAN:    1. Iron deficiency anemia: Although patient's hemoglobin is within normal limits, she does have significantly decreased ferritin and is mildly symptomatic.  Previously she reported she could not tolerate oral iron supplementation.  Proceed with one infusion of 510 mg IV Feraheme on Oct 01, 2017.  Patient does not require second infusion.  Return to clinic in 6 months with repeat laboratory work and further evaluation.    Approximately 30 minutes spent in discussion of which greater than 50% was consultation.  Patient expressed understanding and was in agreement with this plan. She also understands that She can call clinic at any time with any questions, concerns, or complaints.   Jeralyn Ruths, MD   09/28/2017 1:43 PM

## 2017-10-01 ENCOUNTER — Inpatient Hospital Stay: Payer: BLUE CROSS/BLUE SHIELD | Attending: Oncology

## 2017-10-01 VITALS — BP 135/84 | HR 89 | Temp 97.2°F | Resp 18

## 2017-10-01 DIAGNOSIS — D508 Other iron deficiency anemias: Secondary | ICD-10-CM

## 2017-10-01 DIAGNOSIS — D509 Iron deficiency anemia, unspecified: Secondary | ICD-10-CM | POA: Insufficient documentation

## 2017-10-01 MED ORDER — SODIUM CHLORIDE 0.9 % IV SOLN
Freq: Once | INTRAVENOUS | Status: AC
  Administered 2017-10-01: 14:00:00 via INTRAVENOUS
  Filled 2017-10-01: qty 1000

## 2017-10-01 MED ORDER — SODIUM CHLORIDE 0.9 % IV SOLN
510.0000 mg | Freq: Once | INTRAVENOUS | Status: AC
  Administered 2017-10-01: 510 mg via INTRAVENOUS
  Filled 2017-10-01: qty 17

## 2017-10-08 ENCOUNTER — Other Ambulatory Visit: Payer: Self-pay | Admitting: Family Medicine

## 2017-10-11 NOTE — Progress Notes (Deleted)
[name]  [phone] [phone]) [phone]  ID: Ellwood Handler OB: 14-Oct-1990  MR#: [id number]  EVO#:[id number]  Patient Care Team: Lucille Passy, MD as PCP - General  CHIEF COMPLAINT: Iron deficiency anemia.  INTERVAL HISTORY: Patient last evaluated in clinic in July 2017. She returns to clinic today as feeling increasing weakness and fatigue over the past several weeks. Patient states that she will sleep 8-9 hours a night and still wake-up exhausted the next day. She otherwise feels well. She has no neurologic complaints. She denies any chest pain or shortness of breath. She denies any nausea, vomiting, constipation, or diarrhea. She has no melena or hematochezia. She has no urinary complaints. Patient offers no further specific complaints today.  REVIEW OF SYSTEMS:   Review of Systems  Constitutional: Positive for malaise/fatigue. Negative for fever and weight loss.  Respiratory: Negative.  Negative for cough and shortness of breath.   Cardiovascular: Negative.  Negative for chest pain and leg swelling.  Gastrointestinal: Negative.  Negative for abdominal pain, blood in stool and melena.  Genitourinary: Negative.  Negative for hematuria.  Musculoskeletal: Negative.   Skin: Negative.  Negative for rash.  Neurological: Positive for weakness.  Psychiatric/Behavioral: Negative.  The patient is not nervous/anxious.     As per HPI. Otherwise, a complete review of systems is negative.  PAST MEDICAL HISTORY: Past Medical History:  Diagnosis Date  . Anemia   . Skin cancer     PAST SURGICAL HISTORY: Past Surgical History:  Procedure Laterality Date  . TONSILLECTOMY     age 35    FAMILY HISTORY: Sister with iron deficiency anemia, otherwise negative and noncontributory.     ADVANCED DIRECTIVES:    HEALTH MAINTENANCE: Social History   Tobacco Use  . Smoking status: Never Smoker  . Smokeless tobacco: Never Used  Substance Use Topics  .  Alcohol use: No    Alcohol/week: 0.0 oz  . Drug use: No     Colonoscopy:  PAP:  Bone density:  Lipid panel:  No Known Allergies  Current Outpatient Medications  Medication Sig Dispense Refill  . BLISOVI 24 FE 1-20 MG-MCG(24) tablet TAKE 1 TABLET BY MOUTH EVERY DAY 28 tablet 3  . buPROPion (WELLBUTRIN XL) 150 MG 24 hr tablet TAKE 1 TABLET BY MOUTH EVERY DAY 90 tablet 1   No current facility-administered medications for this visit.     OBJECTIVE: There were no vitals filed for this visit.   There is no height or weight on file to calculate BMI.    ECOG FS:0 - Asymptomatic  General: Well-developed, well-nourished, no acute distress. Eyes: Pink conjunctiva, anicteric sclera. Lungs: Clear to auscultation bilaterally. Heart: Regular rate and rhythm. No rubs, murmurs, or gallops. Abdomen: Nontender, normoactive bowel sounds. Musculoskeletal: No edema, cyanosis, or clubbing. Neuro: Alert, answering all questions appropriately. Cranial nerves grossly intact. Skin: No rashes or petechiae noted. Psych: Normal affect.   LAB RESULTS:  Lab Results  Component Value Date   NA 139 10/12/2016   K 4.4 10/12/2016   CL 104 10/12/2016   CO2 30 10/12/2016   GLUCOSE 64 (L) 10/12/2016   BUN 9 10/12/2016   CREATININE 0.72 10/12/2016   CALCIUM 9.7 10/12/2016   PROT 6.8 10/12/2016   ALBUMIN 4.0 10/12/2016   AST 17 10/12/2016   ALT 17 10/12/2016   ALKPHOS 65 10/12/2016   BILITOT 0.3 10/12/2016   GFRNONAA >60 05/11/2015   GFRAA >60 05/11/2015    Lab Results  Component Value Date  WBC 6.6 03/18/2017   NEUTROABS 4.6 03/18/2017   HGB 14.0 03/18/2017   HCT 40.7 03/18/2017   MCV 90.9 03/18/2017   PLT 226 03/18/2017   Lab Results  Component Value Date   IRON 35 03/18/2017   TIBC 382 03/18/2017   IRONPCTSAT 9 (L) 03/18/2017    Lab Results  Component Value Date   FERRITIN 77 03/18/2017     STUDIES: No results found.  ASSESSMENT: Iron deficiency anemia  PLAN:    1. Iron  deficiency anemia: Patient's hemoglobin has improved and is well within normal limits at 14.0. Her iron stores are pending at time of dictation. She does not require IV Feraheme today. She has been instructed to continue her oral iron supplementation. She last received treatment on January 16, 2016. No intervention is needed. Return to clinic in 6 months with repeat laboratory work and further evaluation. 2. Weakness and fatigue: Unlikely related to iron deficiency anemia. Patient agreed to return to the lab to check thyroid panel, B-12, and folate. These are pending at time of dictation.   Approximately 20 minutes was spent in discussion of which greater than 50% was consultation.  Patient expressed understanding and was in agreement with this plan. She also understands that She can call clinic at any time with any questions, concerns, or complaints.    Lloyd Huger, MD   10/11/2017 11:20 PM

## 2017-10-13 ENCOUNTER — Inpatient Hospital Stay: Payer: BLUE CROSS/BLUE SHIELD | Admitting: Oncology

## 2017-10-13 ENCOUNTER — Inpatient Hospital Stay: Payer: BLUE CROSS/BLUE SHIELD

## 2017-10-13 ENCOUNTER — Telehealth: Payer: Self-pay | Admitting: Oncology

## 2017-10-13 NOTE — Telephone Encounter (Signed)
Called patient to [facility] No Show appts.  Left msg on V/M to call to [facility] Lab/MD/Feraheme.

## 2017-12-16 ENCOUNTER — Other Ambulatory Visit: Payer: Self-pay | Admitting: Family Medicine

## 2018-02-07 ENCOUNTER — Other Ambulatory Visit: Payer: Self-pay | Admitting: Family Medicine

## 2018-03-09 ENCOUNTER — Ambulatory Visit: Payer: BLUE CROSS/BLUE SHIELD | Admitting: Family Medicine

## 2018-03-09 ENCOUNTER — Encounter: Payer: Self-pay | Admitting: Family Medicine

## 2018-03-09 VITALS — BP 124/84 | HR 83 | Temp 98.4°F | Ht 63.0 in | Wt 142.2 lb

## 2018-03-09 DIAGNOSIS — Z20828 Contact with and (suspected) exposure to other viral communicable diseases: Secondary | ICD-10-CM | POA: Diagnosis not present

## 2018-03-09 DIAGNOSIS — F4329 Adjustment disorder with other symptoms: Secondary | ICD-10-CM | POA: Diagnosis not present

## 2018-03-09 DIAGNOSIS — D508 Other iron deficiency anemias: Secondary | ICD-10-CM | POA: Diagnosis not present

## 2018-03-09 LAB — CBC WITH DIFFERENTIAL/PLATELET
BASOS PCT: 0.2 % (ref 0.0–3.0)
Basophils Absolute: 0 10*3/uL (ref 0.0–0.1)
EOS PCT: 0.3 % (ref 0.0–5.0)
Eosinophils Absolute: 0 10*3/uL (ref 0.0–0.7)
HCT: 42.1 % (ref 36.0–46.0)
HEMOGLOBIN: 13.9 g/dL (ref 12.0–15.0)
LYMPHS ABS: 2.2 10*3/uL (ref 0.7–4.0)
Lymphocytes Relative: 26.2 % (ref 12.0–46.0)
MCHC: 33 g/dL (ref 30.0–36.0)
MCV: 90.2 fl (ref 78.0–100.0)
MONO ABS: 0.2 10*3/uL (ref 0.1–1.0)
Monocytes Relative: 2.9 % — ABNORMAL LOW (ref 3.0–12.0)
NEUTROS ABS: 5.9 10*3/uL (ref 1.4–7.7)
NEUTROS PCT: 70.4 % (ref 43.0–77.0)
PLATELETS: 283 10*3/uL (ref 150.0–400.0)
RBC: 4.67 Mil/uL (ref 3.87–5.11)
RDW: 12.9 % (ref 11.5–15.5)
WBC: 8.3 10*3/uL (ref 4.0–10.5)

## 2018-03-09 LAB — FERRITIN: Ferritin: 8.6 ng/mL — ABNORMAL LOW (ref 10.0–291.0)

## 2018-03-09 MED ORDER — QUETIAPINE FUMARATE ER 50 MG PO TB24: 50.0000 mg | ORAL_TABLET | Freq: Every day | ORAL | 0 refills | Status: DC

## 2018-03-09 NOTE — Assessment & Plan Note (Signed)
>  25 minutes spent in face to face time with patient, >50% spent in counselling or coordination of care I do not see true signs of mania but she does have significant anxiety, mood instability. Will treat with low dose Seroquel 25 mg XL nightly and refer her to psychiatry for evaluation and treatment. The patient indicates understanding of these issues and agrees with the plan.

## 2018-03-09 NOTE — Patient Instructions (Addendum)
Great to see you. We are starting Seroquel 50 mg XL at bedtime.  We are referring you to a psychiatry appointment.  Please schedule a Physical with a PAP :)

## 2018-03-09 NOTE — Progress Notes (Signed)
Subjective:   Patient ID: [name], female    DOB: Feb 08, 1991, 27 y.o.   MRN: [id number]  [name] is a pleasant 27 y.o. year old female who presents to clinic today with Depression  on 03/09/2018  HPI:  Adjustment disorder with depressed mood. I last saw her on 09/15/17 for this complaint.  Note reviewed- at that time she said the following:  She is going through a tough time. (age 27)  Her ex is taking her to court (age 27) for custody of their 26 year old.  Currently the custody agreement is week to week- so that means she has every other week without him.  She is not used to being away from her son.  She is tearful, not sleeping well.  Does not want to get out of bed in the mornings.  No SI or HI.  No panic attacks.  Decreased appetite, not concentrating well at work.  Started her on Wellbutrin 150 mg XL daily.  She feels she isn't consistent taking it because it doesn't work well.  The feels she has manic episodes- when I ask her what she means by that she says that she is constantly on edge and worried.  Doesn't sleep well.  No other symptoms of "mania" but her family keeps telling her that she has bipolar disorder.  No SI or HI.  Current Outpatient Medications on File Prior to Visit  Medication Sig Dispense Refill  . BLISOVI 24 FE 1-20 MG-MCG(24) tablet TAKE 1 TABLET BY MOUTH EVERY DAY 84 tablet 1  . buPROPion (WELLBUTRIN XL) 150 MG 24 hr tablet TAKE 1 TABLET BY MOUTH EVERY DAY 90 tablet 1   No current facility-administered medications on file prior to visit.     No Known Allergies  Past Medical History:  Diagnosis Date  . Anemia   . Skin cancer     Past Surgical History:  Procedure Laterality Date  . TONSILLECTOMY     age 58    No family history on file.  Social History   Socioeconomic History  . Marital status: Single    Spouse name: Not on file  . Number of children: Not on file  . Years of education: Not on file  . Highest education level: Not on file    Occupational History  . Not on file  Social Needs  . Financial resource strain: Not on file  . Food insecurity:    Worry: Not on file    Inability: Not on file  . Transportation needs:    Medical: Not on file    Non-medical: Not on file  Tobacco Use  . Smoking status: Never Smoker  . Smokeless tobacco: Never Used  Substance and Sexual Activity  . Alcohol use: No    Alcohol/week: 0.0 standard drinks  . Drug use: No  . Sexual activity: Not on file  Lifestyle  . Physical activity:    Days per week: Not on file    Minutes per session: Not on file  . Stress: Not on file  Relationships  . Social connections:    Talks on phone: Not on file    Gets together: Not on file    Attends religious service: Not on file    Active member of club or organization: Not on file    Attends meetings of clubs or organizations: Not on file    Relationship status: Not on file  . Intimate partner violence:    Fear of current or ex partner: Not  on file    Emotionally abused: Not on file    Physically abused: Not on file    Forced sexual activity: Not on file  Other Topics Concern  . Not on file  Social History Narrative   Fiance   50 year old son, Donna Christen    The PMH, PSH, Social History, Family History, Medications, and allergies have been reviewed in [facility], and have been updated if relevant.  Review of Systems  Constitutional: Positive for fatigue.  HENT: Negative.   Respiratory: Negative.   Cardiovascular: Negative.   Gastrointestinal: Negative.   Musculoskeletal: Negative.   Psychiatric/Behavioral: Positive for decreased concentration, dysphoric mood and sleep disturbance. Negative for agitation, behavioral problems, confusion, hallucinations, self-injury and suicidal ideas. The patient is nervous/anxious.   All other systems reviewed and are negative.      Objective:    BP 124/84 (BP Location: Left Arm, Patient Position: Sitting, Cuff Size: Normal)   Pulse 83   Temp 98.4 F (36.9  C) (Oral)   Ht 5\' 3"  (1.6 m)   Wt 142 lb 3.2 oz (64.5 kg)   LMP 03/09/2018   SpO2 99%   BMI 25.19 kg/m    Physical Exam  Constitutional: She is oriented to person, place, and time. She appears well-developed and well-nourished. No distress.  HENT:  Head: Normocephalic and atraumatic.  Eyes: EOM are normal.  Neck: Normal range of motion.  Cardiovascular: Normal rate.  Pulmonary/Chest: Effort normal.  Neurological: She is alert and oriented to person, place, and time.  Skin: Skin is warm and dry. She is not diaphoretic.  Psychiatric: Her behavior is normal. Judgment and thought content normal. Her mood appears anxious. Her speech is rapid and/or pressured. Her speech is not delayed, not tangential and not slurred. She is not actively hallucinating. Cognition and memory are normal. She is communicative. She is attentive.  Nursing note and vitals reviewed.         Assessment & Plan:   Adjustment disorder with mixed emotional features No follow-ups on file.

## 2018-03-10 LAB — MEASLES/MUMPS/RUBELLA IMMUNITY
Mumps IgG: 11.2 AU/mL
Rubella: 2.14 index
Rubeola IgG: <25 AU/mL — ABNORMAL LOW

## 2018-03-15 ENCOUNTER — Other Ambulatory Visit: Payer: Self-pay | Admitting: *Deleted

## 2018-03-15 ENCOUNTER — Telehealth: Payer: Self-pay | Admitting: Oncology

## 2018-03-15 DIAGNOSIS — D649 Anemia, unspecified: Secondary | ICD-10-CM

## 2018-03-15 NOTE — Telephone Encounter (Signed)
Called both numbers listed was able to leave a message on mobile number for pt to call office back to get labs scheduled. Home number belong to another gentleman known to pt, but no information was left with him in regards to call office back.

## 2018-03-27 NOTE — Progress Notes (Signed)
[name]  [phone] Fax:([phone]) [phone]  ID: [name] OB: 01-Jan-1991  MR#: [id number]  TDV#:[id number]  Patient Care Team: Lucille Passy, MD as PCP - General  CHIEF COMPLAINT: Iron deficiency anemia.  INTERVAL HISTORY: Patient returns to clinic today for repeat laboratory can routine six-month evaluation.  She continues to have chronic fatigue and feels "cold" all the time, but otherwise feels well.  She denies any recent fevers or illnesses.  She has no neurologic complaints. She denies any chest pain or shortness of breath. She denies any nausea, vomiting, constipation, or diarrhea. She has no melena or hematochezia. She has no urinary complaints.  Patient offers no further specific complaints today.  REVIEW OF SYSTEMS:   Review of Systems  Constitutional: Positive for malaise/fatigue. Negative for fever and weight loss.  Respiratory: Negative.  Negative for cough and shortness of breath.   Cardiovascular: Negative.  Negative for chest pain and leg swelling.  Gastrointestinal: Negative.  Negative for abdominal pain, blood in stool and melena.  Genitourinary: Negative.  Negative for hematuria.  Musculoskeletal: Negative.  Negative for back pain.  Skin: Negative.  Negative for rash.  Neurological: Negative.  Negative for focal weakness, weakness and headaches.  Psychiatric/Behavioral: Negative.  The patient is not nervous/anxious.     As per HPI. Otherwise, a complete review of systems is negative.  PAST MEDICAL HISTORY: Past Medical History:  Diagnosis Date  . Anemia   . Skin cancer     PAST SURGICAL HISTORY: Past Surgical History:  Procedure Laterality Date  . TONSILLECTOMY     age 81    FAMILY HISTORY: Sister with iron deficiency anemia, otherwise negative and noncontributory.     ADVANCED DIRECTIVES:    HEALTH MAINTENANCE: Social History   Tobacco Use  . Smoking status: Never Smoker  . Smokeless tobacco: Never Used    Substance Use Topics  . Alcohol use: No    Alcohol/week: 0.0 standard drinks  . Drug use: No     Colonoscopy:  PAP:  Bone density:  Lipid panel:  No Known Allergies  Current Outpatient Medications  Medication Sig Dispense Refill  . BLISOVI 24 FE 1-20 MG-MCG(24) tablet TAKE 1 TABLET BY MOUTH EVERY DAY 84 tablet 1  . buPROPion (WELLBUTRIN XL) 150 MG 24 hr tablet TAKE 1 TABLET BY MOUTH EVERY DAY 90 tablet 1  . QUEtiapine (SEROQUEL XR) 50 MG TB24 24 hr tablet Take 1 tablet (50 mg total) by mouth at bedtime. 90 each 0   No current facility-administered medications for this visit.     OBJECTIVE: Vitals:   03/29/18 1330 03/29/18 1333  BP:  (!) 139/91  Pulse:  85  Resp: 12   Temp:  (!) 97 F (36.1 C)     Body mass index is 26.15 kg/m.    ECOG FS:0 - Asymptomatic  General: Well-developed, well-nourished, no acute distress. Eyes: Pink conjunctiva, anicteric sclera. HEENT: Normocephalic, moist mucous membranes. Lungs: Clear to auscultation bilaterally. Heart: Regular rate and rhythm. No rubs, murmurs, or gallops. Abdomen: Soft, nontender, nondistended. No organomegaly noted, normoactive bowel sounds. Musculoskeletal: No edema, cyanosis, or clubbing. Neuro: Alert, answering all questions appropriately. Cranial nerves grossly intact. Skin: No rashes or petechiae noted. Psych: Normal affect.   LAB RESULTS:  Lab Results  Component Value Date   NA 139 10/12/2016   K 4.4 10/12/2016   CL 104 10/12/2016   CO2 30 10/12/2016   GLUCOSE 64 (L) 10/12/2016   BUN 9 10/12/2016   CREATININE  0.72 10/12/2016   CALCIUM 9.7 10/12/2016   PROT 6.8 10/12/2016   ALBUMIN 4.0 10/12/2016   AST 17 10/12/2016   ALT 17 10/12/2016   ALKPHOS 65 10/12/2016   BILITOT 0.3 10/12/2016   GFRNONAA >60 05/11/2015   GFRAA >60 05/11/2015    Lab Results  Component Value Date   WBC 8.3 03/09/2018   NEUTROABS 5.9 03/09/2018   HGB 13.9 03/09/2018   HCT 42.1 03/09/2018   MCV 90.2 03/09/2018   PLT  283.0 03/09/2018   Lab Results  Component Value Date   IRON 35 03/18/2017   TIBC 382 03/18/2017   IRONPCTSAT 9 (L) 03/18/2017    Lab Results  Component Value Date   FERRITIN 8.6 (L) 03/09/2018     STUDIES: No results found.  ASSESSMENT: Iron deficiency anemia  PLAN:    1. Iron deficiency anemia: Patient's hemoglobin continues to be within normal limits.  Ferritin is slightly decreased at approximately 9.  She does not require IV Feraheme today, but may require treatment at her next clinic visit.  Patient has been encouraged to continue her oral iron supplementation. She last received treatment on January 16, 2016. No intervention is needed.  Return to clinic in 6 months with repeat laboratory can further evaluation.  If her hemoglobin continues to be within normal limits, patient could possibly be discharged from clinic.   2.  B12 deficiency: Patient was encouraged to take B12 supplementation.  I spent a total of 20 minutes face-to-face with the patient of which greater than 50% of the visit was spent in counseling and coordination of care as detailed above.   Patient expressed understanding and was in agreement with this plan. She also understands that She can call clinic at any time with any questions, concerns, or complaints.    Lloyd Huger, MD   04/01/2018 8:45 AM

## 2018-03-28 NOTE — Progress Notes (Deleted)
[facility]  Telephone:([phone] Fax:(336) [phone]  ID: Natalie Wu OB: [birth year]  MR#: [id number]  OZD#:[id number]  Patient Care Team: Dianne Dun, MD as PCP - General  CHIEF COMPLAINT: Iron deficiency anemia.  INTERVAL HISTORY: Patient last evaluated in clinic on July 02, 2016.  She is referred back for declining iron stores.  She also states she is symptomatic with increased weakness and fatigue over the past several weeks.  She otherwise feels well and is asymptomatic.  She has no neurologic complaints.  She denies any recent fevers or illnesses.  She has a good appetite and denies weight loss.  She has no chest pain or shortness of breath.  She denies any nausea, vomiting, constipation, or diarrhea.  She has no urinary complaints.  Patient otherwise feels well and offers no further specific complaints today.  REVIEW OF SYSTEMS:   Review of Systems  Constitutional: Negative.  Negative for fever, malaise/fatigue and weight loss.  Respiratory: Negative.  Negative for cough and shortness of breath.   Cardiovascular: Negative.  Negative for chest pain and leg swelling.  Gastrointestinal: Negative.  Negative for abdominal pain, blood in stool and melena.  Genitourinary: Negative.  Negative for hematuria.  Musculoskeletal: Negative.   Neurological: Negative.  Negative for sensory change, focal weakness and weakness.  Psychiatric/Behavioral: Negative.  The patient is not nervous/anxious.     As per HPI. Otherwise, a complete review of systems is negative.  PAST MEDICAL HISTORY: Past Medical History:  Diagnosis Date  . Iron deficiency anemia     PAST SURGICAL HISTORY: Past Surgical History:  Procedure Laterality Date  . TONSILLECTOMY      FAMILY HISTORY: Positive for lung cancer, breast cancer. Also diabetes, heart disease, hypertension, and "liver disease".     ADVANCED DIRECTIVES:    HEALTH MAINTENANCE: Social History   Tobacco Use  .  Smoking status: Never Smoker  . Smokeless tobacco: Never Used  Substance Use Topics  . Alcohol use: No  . Drug use: No     Colonoscopy:  PAP:  Bone density:  Lipid panel:  No Known Allergies  Current Outpatient Medications  Medication Sig Dispense Refill  . ALPRAZolam (XANAX) 0.5 MG tablet Take 1 tablet (0.5 mg total) by mouth 3 (three) times daily as needed for anxiety. 20 tablet 0  . TRI-PREVIFEM 0.18/0.215/0.25 MG-35 MCG tablet TAKE 1 TABLET BY MOUTH EVERY DAY 84 tablet 3   No current facility-administered medications for this visit.     OBJECTIVE: There were no vitals filed for this visit.   There is no height or weight on file to calculate BMI.    ECOG FS:0 - Asymptomatic  General: Well-developed, well-nourished, no acute distress. Eyes: Pink conjunctiva, anicteric sclera. HEENT: Normocephalic, moist mucous membranes, clear oropharnyx. Lungs: Clear to auscultation bilaterally. Heart: Regular rate and rhythm. No rubs, murmurs, or gallops. Abdomen: Soft, nontender, nondistended. No organomegaly noted, normoactive bowel sounds. Musculoskeletal: No edema, cyanosis, or clubbing. Neuro: Alert, answering all questions appropriately. Cranial nerves grossly intact. Skin: No rashes or petechiae noted. Psych: Normal affect. Lymphatics: No cervical, calvicular, axillary or inguinal LAD.  LAB RESULTS:  Lab Results  Component Value Date   NA 139 04/22/2016   K 4.0 04/22/2016   CL 105 04/22/2016   CO2 26 04/22/2016   GLUCOSE 81 04/22/2016   BUN 13 04/22/2016   CREATININE 0.75 04/22/2016   CALCIUM 9.0 04/22/2016   PROT 6.5 04/22/2016   ALBUMIN 3.7 04/22/2016   AST 14 04/22/2016  ALT 17 04/22/2016   ALKPHOS 71 04/22/2016   BILITOT 0.5 04/22/2016    Lab Results  Component Value Date   WBC 7.2 09/20/2017   NEUTROABS 3.2 09/20/2017   HGB 12.2 09/20/2017   HCT 36.7 09/20/2017   MCV 86.2 09/20/2017   PLT 310.0 09/20/2017   Lab Results  Component Value Date   IRON  92 07/02/2016   TIBC 493 (H) 07/02/2016   IRONPCTSAT 19 07/02/2016    Lab Results  Component Value Date   FERRITIN 4.5 (L) 09/20/2017     STUDIES: No results found.  ASSESSMENT: Iron deficiency anemia.  PLAN:    1. Iron deficiency anemia: Although patient's hemoglobin is within normal limits, she does have significantly decreased ferritin and is mildly symptomatic.  Previously she reported she could not tolerate oral iron supplementation.  Proceed with one infusion of 510 mg IV Feraheme on Oct 01, 2017.  Patient does not require second infusion.  Return to clinic in 6 months with repeat laboratory work and further evaluation.    Approximately 30 minutes spent in discussion of which greater than 50% was consultation.  Patient expressed understanding and was in agreement with this plan. She also understands that She can call clinic at any time with any questions, concerns, or complaints.   Jeralyn Ruths, MD   03/28/2018 12:38 AM

## 2018-03-29 ENCOUNTER — Other Ambulatory Visit: Payer: Self-pay

## 2018-03-29 ENCOUNTER — Inpatient Hospital Stay: Payer: BLUE CROSS/BLUE SHIELD | Attending: Oncology

## 2018-03-29 ENCOUNTER — Encounter: Payer: Self-pay | Admitting: Oncology

## 2018-03-29 ENCOUNTER — Inpatient Hospital Stay (HOSPITAL_BASED_OUTPATIENT_CLINIC_OR_DEPARTMENT_OTHER): Payer: BLUE CROSS/BLUE SHIELD | Admitting: Oncology

## 2018-03-29 ENCOUNTER — Encounter: Payer: Self-pay | Admitting: Family Medicine

## 2018-03-29 ENCOUNTER — Ambulatory Visit (INDEPENDENT_AMBULATORY_CARE_PROVIDER_SITE_OTHER): Payer: BLUE CROSS/BLUE SHIELD | Admitting: Behavioral Health

## 2018-03-29 VITALS — BP 139/91 | HR 85 | Temp 97.0°F | Resp 12 | Ht 63.0 in | Wt 147.6 lb

## 2018-03-29 DIAGNOSIS — Z79899 Other long term (current) drug therapy: Secondary | ICD-10-CM | POA: Diagnosis not present

## 2018-03-29 DIAGNOSIS — R5382 Chronic fatigue, unspecified: Secondary | ICD-10-CM

## 2018-03-29 DIAGNOSIS — E538 Deficiency of other specified B group vitamins: Secondary | ICD-10-CM | POA: Insufficient documentation

## 2018-03-29 DIAGNOSIS — Z23 Encounter for immunization: Secondary | ICD-10-CM

## 2018-03-29 DIAGNOSIS — D509 Iron deficiency anemia, unspecified: Secondary | ICD-10-CM

## 2018-03-29 DIAGNOSIS — D508 Other iron deficiency anemias: Secondary | ICD-10-CM

## 2018-03-29 NOTE — Progress Notes (Signed)
Patient came in clinic for MMR vaccination per Lab Note on 03/09/18. SQ injection was given in the left arm. Injection was tolerated well. No s/s of a reaction prior to patient leaving the nurse visit. She declined VIS info sheet on MMR vaccine.

## 2018-03-29 NOTE — Progress Notes (Signed)
Patient feeling tired all the times, she stay cold.

## 2018-03-30 NOTE — Telephone Encounter (Signed)
Copied from [place] [phone]. Topic: General - Other >> Mar 30, 2018  2:03 PM [name] wrote: Reason for [id number] called to say that she received labs that say her iron is low but when she consulted another Dr. She was told that her iron levels are fine. Request Warner call back from Dr Deborra Medina with an explanation of iron levels. Ph# [phone]

## 2018-03-31 ENCOUNTER — Inpatient Hospital Stay: Payer: BLUE CROSS/BLUE SHIELD

## 2018-03-31 ENCOUNTER — Inpatient Hospital Stay: Payer: BLUE CROSS/BLUE SHIELD | Admitting: Oncology

## 2018-04-06 ENCOUNTER — Encounter: Payer: BLUE CROSS/BLUE SHIELD | Admitting: Family Medicine

## 2018-04-12 ENCOUNTER — Encounter: Payer: BLUE CROSS/BLUE SHIELD | Admitting: Family Medicine

## 2018-05-03 ENCOUNTER — Encounter: Payer: BLUE CROSS/BLUE SHIELD | Admitting: Family Medicine

## 2018-05-17 ENCOUNTER — Encounter: Payer: Self-pay | Admitting: Family Medicine

## 2018-05-17 ENCOUNTER — Telehealth: Payer: Self-pay

## 2018-05-17 DIAGNOSIS — F4329 Adjustment disorder with other symptoms: Secondary | ICD-10-CM

## 2018-05-17 NOTE — Telephone Encounter (Signed)
TA-Can you please make another referral to Psych? She was not able to schedule appointments but is now able to but the referral is closed/plz advise/thx dmf

## 2018-05-17 NOTE — Telephone Encounter (Signed)
Referral placed.

## 2018-05-23 ENCOUNTER — Other Ambulatory Visit: Payer: Self-pay | Admitting: Family Medicine

## 2018-06-04 ENCOUNTER — Other Ambulatory Visit: Payer: Self-pay | Admitting: Family Medicine

## 2018-06-07 ENCOUNTER — Encounter: Payer: BLUE CROSS/BLUE SHIELD | Admitting: Family Medicine

## 2018-07-11 ENCOUNTER — Ambulatory Visit (INDEPENDENT_AMBULATORY_CARE_PROVIDER_SITE_OTHER): Payer: 59 | Admitting: Family Medicine

## 2018-07-11 ENCOUNTER — Encounter: Payer: Self-pay | Admitting: Family Medicine

## 2018-07-11 VITALS — BP 122/84 | HR 80 | Temp 98.7°F | Ht 63.75 in | Wt 170.0 lb

## 2018-07-11 DIAGNOSIS — Z0000 Encounter for general adult medical examination without abnormal findings: Secondary | ICD-10-CM

## 2018-07-11 DIAGNOSIS — R5383 Other fatigue: Secondary | ICD-10-CM | POA: Diagnosis not present

## 2018-07-11 DIAGNOSIS — Z309 Encounter for contraceptive management, unspecified: Secondary | ICD-10-CM | POA: Diagnosis not present

## 2018-07-11 DIAGNOSIS — N926 Irregular menstruation, unspecified: Secondary | ICD-10-CM | POA: Diagnosis not present

## 2018-07-11 DIAGNOSIS — D509 Iron deficiency anemia, unspecified: Secondary | ICD-10-CM

## 2018-07-11 LAB — COMPREHENSIVE METABOLIC PANEL
ALBUMIN: 4 g/dL (ref 3.5–5.2)
ALK PHOS: 67 U/L (ref 39–117)
ALT: 17 U/L (ref 0–35)
AST: 16 U/L (ref 0–37)
BUN: 8 mg/dL (ref 6–23)
CALCIUM: 9.3 mg/dL (ref 8.4–10.5)
CO2: 26 mEq/L (ref 19–32)
Chloride: 103 mEq/L (ref 96–112)
Creatinine, Ser: 0.65 mg/dL (ref 0.40–1.20)
GFR: 109.99 mL/min (ref >60.00)
Glucose, Bld: 79 mg/dL (ref 70–99)
POTASSIUM: 3.8 meq/L (ref 3.5–5.1)
Sodium: 137 mEq/L (ref 135–145)
TOTAL PROTEIN: 7 g/dL (ref 6.0–8.3)
Total Bilirubin: 0.4 mg/dL (ref 0.2–1.2)

## 2018-07-11 LAB — CBC WITH DIFFERENTIAL/PLATELET
BASOS ABS: 0 10*3/uL (ref 0.0–0.1)
Basophils Relative: 0.3 % (ref 0.0–3.0)
EOS ABS: 0.1 10*3/uL (ref 0.0–0.7)
Eosinophils Relative: 0.6 % (ref 0.0–5.0)
HCT: 40.2 % (ref 36.0–46.0)
Hemoglobin: 13.4 g/dL (ref 12.0–15.0)
Lymphocytes Relative: 35.7 % (ref 12.0–46.0)
Lymphs Abs: 2.9 10*3/uL (ref 0.7–4.0)
MCHC: 33.4 g/dL (ref 30.0–36.0)
MCV: 88.4 fl (ref 78.0–100.0)
MONOS PCT: 4.1 % (ref 3.0–12.0)
Monocytes Absolute: 0.3 10*3/uL (ref 0.1–1.0)
Neutro Abs: 4.9 10*3/uL (ref 1.4–7.7)
Neutrophils Relative %: 59.3 % (ref 43.0–77.0)
Platelets: 302 10*3/uL (ref 150.0–400.0)
RBC: 4.55 Mil/uL (ref 3.87–5.11)
RDW: 12.8 % (ref 11.5–15.5)
WBC: 8.2 10*3/uL (ref 4.0–10.5)

## 2018-07-11 LAB — VITAMIN D 25 HYDROXY (VIT D DEFICIENCY, FRACTURES): VITD: 39.73 ng/mL (ref 30.00–100.00)

## 2018-07-11 LAB — TSH: TSH: 1.69 u[IU]/mL (ref 0.35–4.50)

## 2018-07-11 MED ORDER — NORGESTIM-ETH ESTRAD TRIPHASIC 0.18/0.215/0.25 MG-35 MCG PO TABS: 1.0000 | ORAL_TABLET | Freq: Every day | ORAL | 3 refills | Status: DC

## 2018-07-11 NOTE — Assessment & Plan Note (Signed)
Improved with OCPs

## 2018-07-11 NOTE — Progress Notes (Signed)
Subjective:   Patient ID: Natalie Wu, female    DOB: 14-Jul-1990, 28 y.o.   MRN: [id number]  Natalie Wu is a pleasant 28 y.o. year old female who presents to clinic today with Follow-up  on 07/11/2018  HPI:   Iron deficiency anemia- she is taking oral iron and receiving IV iron infusions through hematology.  Has not received an infusion 2017. Has been more fatigued lately.  Lab Results  Component Value Date   WBC 7.2 09/20/2017   HGB 12.2 09/20/2017   HCT 36.7 09/20/2017   MCV 86.2 09/20/2017   PLT 310.0 09/20/2017   Lab Results  Component Value Date   FERRITIN 4.5 (L) 09/20/2017      Current Outpatient Medications on File Prior to Visit  Medication Sig Dispense Refill  . ALPRAZolam (XANAX) 0.5 MG tablet Take 1 tablet (0.5 mg total) by mouth 3 (three) times daily as needed for anxiety. 20 tablet 0  . Iron-Vitamin C (VITRON-C) 65-125 MG TABS Take 1 tablet by mouth every morning.     No current facility-administered medications on file prior to visit.     No Known Allergies  Past Medical History:  Diagnosis Date  . Iron deficiency anemia     Past Surgical History:  Procedure Laterality Date  . TONSILLECTOMY      Family History  Problem Relation Age of Onset  . Breast cancer Maternal Uncle   . Bone cancer Maternal Grandmother     Social History   Socioeconomic History  . Marital status: Single    Spouse name: Not on file  . Number of children: Not on file  . Years of education: Not on file  . Highest education level: Not on file  Occupational History  . Not on file  Social Needs  . Financial resource strain: Not on file  . Food insecurity:    Worry: Not on file    Inability: Not on file  . Transportation needs:    Medical: Not on file    Non-medical: Not on file  Tobacco Use  . Smoking status: Never Smoker  . Smokeless tobacco: Never Used  Substance and Sexual Activity  . Alcohol use: No  . Drug use: No  . Sexual activity: Not on  file  Lifestyle  . Physical activity:    Days per week: Not on file    Minutes per session: Not on file  . Stress: Not on file  Relationships  . Social connections:    Talks on phone: Not on file    Gets together: Not on file    Attends religious service: Not on file    Active member of club or organization: Not on file    Attends meetings of clubs or organizations: Not on file    Relationship status: Not on file  . Intimate partner violence:    Fear of current or ex partner: Not on file    Emotionally abused: Not on file    Physically abused: Not on file    Forced sexual activity: Not on file  Other Topics Concern  . Not on file  Social History Narrative   Single   Has twin sister, Optician, dispensing in high school   Not sexually active   Wants to be physical therapist   The PMH, [place], Social History, Family History, Medications, and allergies have been reviewed in [facility], and have been updated if relevant.   Review of Systems  Constitutional: Positive for fatigue.  HENT:  Negative.   Eyes: Negative.   Respiratory: Negative.   Cardiovascular: Negative.   Gastrointestinal: Negative.   Endocrine: Negative.   Genitourinary: Negative.   Musculoskeletal: Negative.   Allergic/Immunologic: Negative.   Neurological: Negative.   Hematological: Negative.   Psychiatric/Behavioral: Negative.   All other systems reviewed and are negative.      Objective:    BP 122/84 (BP Location: Left Arm, Patient Position: Sitting, Cuff Size: Normal)   Pulse 80   Temp 98.7 F (37.1 C) (Oral)   Ht 5' 3.75" (1.619 m)   Wt 170 lb (77.1 kg)   LMP 07/09/2018 (Exact Date)   SpO2 98%   BMI 29.41 kg/m    Physical Exam   General:  Well-developed,well-nourished,in no acute distress; alert,appropriate and cooperative throughout examination Head:  normocephalic and atraumatic.   Eyes:  vision grossly intact, PERRL Ears:  R ear normal and L ear normal externally, TMs clear bilaterally Nose:  no  external deformity.   Mouth:  good dentition.   Neck:  No deformities, masses, or tenderness noted. Lungs:  Normal respiratory effort, chest expands symmetrically. Lungs are clear to auscultation, no crackles or wheezes. Heart:  Normal rate and regular rhythm. S1 and S2 normal without gallop, murmur, click, rub or other extra sounds. Abdomen:  Bowel sounds positive,abdomen soft and non-tender without masses, organomegaly or hernias noted. Msk:  No deformity or scoliosis noted of thoracic or lumbar spine.   Extremities:  No clubbing, cyanosis, edema, or deformity noted with normal full range of motion of all joints.   Neurologic:  alert & oriented X3 and gait normal.   Skin:  Intact without suspicious lesions or rashes Cervical Nodes:  No lymphadenopathy noted Axillary Nodes:  No palpable lymphadenopathy Psych:  Cognition and judgment appear intact. Alert and cooperative with normal attention span and concentration. No apparent delusions, illusions, hallucinations      Assessment & Plan:   Iron deficiency anemia, unspecified iron deficiency anemia type - Plan: Comp Met (CMET), CBC w/Diff, Iron, TIBC and Ferritin Panel, TSH, VITAMIN D 25 Hydroxy (Vit-D Deficiency, Fractures)  Well woman exam without gynecological exam - Plan: Comp Met (CMET), CBC w/Diff, Iron, TIBC and Ferritin Panel, Lipid Profile, TSH, VITAMIN D 25 Hydroxy (Vit-D Deficiency, Fractures)  Encounter for contraceptive management, unspecified type  Irregular periods No follow-ups on file.

## 2018-07-11 NOTE — Assessment & Plan Note (Signed)
Reviewed preventive care protocols, scheduled due services, and updated immunizations Discussed nutrition, exercise, diet, and healthy lifestyle.  

## 2018-07-11 NOTE — Assessment & Plan Note (Addendum)
Has not had an infusion since 2017. Due for labs today.

## 2018-07-11 NOTE — Patient Instructions (Addendum)
Please schedule PAP and physical on your way out.  Great to see you. I will call you with your lab results from today and you can view them online.

## 2018-07-11 NOTE — Assessment & Plan Note (Signed)
She is pleased with Trifem. eRx refills sent.

## 2018-07-12 LAB — IRON,TIBC AND FERRITIN PANEL
%SAT: 14 % (calc) — ABNORMAL LOW (ref 16–45)
FERRITIN: 16 ng/mL (ref 16–154)
IRON: 69 ug/dL (ref 40–190)
TIBC: 496 ug/dL — AB (ref 250–450)

## 2018-07-19 ENCOUNTER — Encounter: Payer: Self-pay | Admitting: Family Medicine

## 2018-08-01 NOTE — Progress Notes (Signed)
Subjective:   Patient ID: Natalie Wu, female    DOB: 07/26/1990, 28 y.o.   MRN: [id number]  Cierria Kogan is a pleasant 28 y.o. year old female who presents to clinic today with PAP Only (Patient is here today for PAP as when she had her CPE she was menstruating.  Declines STD screening.)  on 08/02/2018  HPI: Health Maintenance  Topic Date Due  . PAP-Cervical Cytology Screening  04/19/2017  . PAP SMEAR-Modifier  04/19/2017  . TETANUS/TDAP  11/05/2024  . INFLUENZA VACCINE  Completed  . HIV Screening  Completed    Overdue for pap smear.  Lab Results  Component Value Date   CHOL 227 (H) 04/19/2014   HDL 68.70 04/19/2014   LDLCALC 126 (H) 04/19/2014   LDLDIRECT 120.5 08/10/2012   TRIG 161.0 (H) 04/19/2014   CHOLHDL 3 04/19/2014   Lab Results  Component Value Date   ALT 17 07/11/2018   AST 16 07/11/2018   ALKPHOS 67 07/11/2018   BILITOT 0.4 07/11/2018   Lab Results  Component Value Date   TSH 1.69 07/11/2018   Lab Results  Component Value Date   WBC 8.2 07/11/2018   HGB 13.4 07/11/2018   HCT 40.2 07/11/2018   MCV 88.4 07/11/2018   PLT 302.0 07/11/2018    Current Outpatient Medications on File Prior to Visit  Medication Sig Dispense Refill  . ALPRAZolam (XANAX) 0.5 MG tablet Take 1 tablet (0.5 mg total) by mouth 3 (three) times daily as needed for anxiety. 20 tablet 0  . Iron-Vitamin C (VITRON-C) 65-125 MG TABS Take 1 tablet by mouth every morning.    . Norgestimate-Ethinyl Estradiol Triphasic (TRI-PREVIFEM) 0.18/0.215/0.25 MG-35 MCG tablet Take 1 tablet by mouth daily. 84 tablet 3   No current facility-administered medications on file prior to visit.     No Known Allergies  Past Medical History:  Diagnosis Date  . Iron deficiency anemia     Past Surgical History:  Procedure Laterality Date  . TONSILLECTOMY      Family History  Problem Relation Age of Onset  . Breast cancer Maternal Uncle   . Bone cancer Maternal Grandmother     Social  History   Socioeconomic History  . Marital status: Single    Spouse name: Not on file  . Number of children: Not on file  . Years of education: Not on file  . Highest education level: Not on file  Occupational History  . Not on file  Social Needs  . Financial resource strain: Not on file  . Food insecurity:    Worry: Not on file    Inability: Not on file  . Transportation needs:    Medical: Not on file    Non-medical: Not on file  Tobacco Use  . Smoking status: Never Smoker  . Smokeless tobacco: Never Used  Substance and Sexual Activity  . Alcohol use: No  . Drug use: No  . Sexual activity: Not on file  Lifestyle  . Physical activity:    Days per week: Not on file    Minutes per session: Not on file  . Stress: Not on file  Relationships  . Social connections:    Talks on phone: Not on file    Gets together: Not on file    Attends religious service: Not on file    Active member of club or organization: Not on file    Attends meetings of clubs or organizations: Not on file    Relationship status: Not  on file  . Intimate partner violence:    Fear of current or ex partner: Not on file    Emotionally abused: Not on file    Physically abused: Not on file    Forced sexual activity: Not on file  Other Topics Concern  . Not on file  Social History Narrative   Single   Has twin sister, Programmer, applications in high school   Not sexually active   Wants to be physical therapist   The PMH, PSH, Social History, Family History, Medications, and allergies have been reviewed in [facility], and have been updated if relevant.  Review of Systems  Constitutional: Negative.   HENT: Negative.   Eyes: Negative.   Respiratory: Negative.   Cardiovascular: Negative.   Gastrointestinal: Negative.   Endocrine: Negative.   Genitourinary: Negative.   Musculoskeletal: Negative.   Allergic/Immunologic: Negative.   Neurological: Negative.   Hematological: Negative.   Psychiatric/Behavioral:  Negative.   All other systems reviewed and are negative.      Objective:    BP 110/82 (BP Location: Left Arm, Patient Position: Sitting, Cuff Size: Normal)   Pulse 84   Temp 98.5 F (36.9 C) (Oral)   Ht 5\' 4"  (1.626 m)   Wt 174 lb 12.8 oz (79.3 kg)   LMP 07/09/2018 (Exact Date)   SpO2 99%   BMI 30.00 kg/m    Physical Exam   General:  Well-developed,well-nourished,in no acute distress; alert,appropriate and cooperative throughout examination Head:  normocephalic and atraumatic.   Eyes:  vision grossly intact, PERRL Ears:  R ear normal and L ear normal externally, TMs clear bilaterally Nose:  no external deformity.   Mouth:  good dentition.   Neck:  No deformities, masses, or tenderness noted. Lungs:  Normal respiratory effort, chest expands symmetrically. Lungs are clear to auscultation, no crackles or wheezes. Heart:  Normal rate and regular rhythm. S1 and S2 normal without gallop, murmur, click, rub or other extra sounds. Abdomen:  Bowel sounds positive,abdomen soft and non-tender without masses, organomegaly or hernias noted. Rectal:  no external abnormalities.   Genitalia:  Pelvic Exam:        External: normal female genitalia without lesions or masses        Vagina: normal without lesions or masses        Cervix: normal without lesions or masses        Adnexa: normal bimanual exam without masses or fullness        Uterus: normal by palpation        Pap smear: performed Msk:  No deformity or scoliosis noted of thoracic or lumbar spine.   Extremities:  No clubbing, cyanosis, edema, or deformity noted with normal full range of motion of all joints.   Neurologic:  alert & oriented X3 and gait normal.   Skin:  Intact without suspicious lesions or rashes Cervical Nodes:  No lymphadenopathy noted Axillary Nodes:  No palpable lymphadenopathy Psych:  Cognition and judgment appear intact. Alert and cooperative with normal attention span and concentration. No apparent  delusions, illusions, hallucinations       Assessment & Plan:   Encounter for gynecological examination without abnormal finding - Plan: Cytology - PAP( [facility]) No follow-ups on file.

## 2018-08-02 ENCOUNTER — Encounter: Payer: Self-pay | Admitting: Family Medicine

## 2018-08-02 ENCOUNTER — Ambulatory Visit (INDEPENDENT_AMBULATORY_CARE_PROVIDER_SITE_OTHER): Payer: 59 | Admitting: Family Medicine

## 2018-08-02 ENCOUNTER — Other Ambulatory Visit (HOSPITAL_COMMUNITY)
Admission: RE | Admit: 2018-08-02 | Discharge: 2018-08-02 | Disposition: A | Discharge status: Home / Self Care | Payer: 59 | Source: Ambulatory Visit | Attending: Family Medicine | Admitting: Family Medicine

## 2018-08-02 VITALS — BP 110/82 | HR 84 | Temp 98.5°F | Ht 64.0 in | Wt 174.8 lb

## 2018-08-02 DIAGNOSIS — Z713 Dietary counseling and surveillance: Secondary | ICD-10-CM | POA: Insufficient documentation

## 2018-08-02 DIAGNOSIS — Z01419 Encounter for gynecological examination (general) (routine) without abnormal findings: Secondary | ICD-10-CM | POA: Diagnosis not present

## 2018-08-02 NOTE — Patient Instructions (Signed)
Great to see you. I will call you with your results results from today and you can view them online.

## 2018-08-04 ENCOUNTER — Encounter: Payer: Self-pay | Admitting: Family Medicine

## 2018-08-09 LAB — CYTOLOGY - PAP
Diagnosis: UNDETERMINED — AB
HPV: NOT DETECTED

## 2018-08-18 ENCOUNTER — Encounter: Payer: Self-pay | Admitting: Family Medicine

## 2018-08-18 ENCOUNTER — Other Ambulatory Visit: Payer: Self-pay

## 2018-08-18 MED ORDER — NORGESTIM-ETH ESTRAD TRIPHASIC 0.18/0.215/0.25 MG-35 MCG PO TABS: 1.0000 | ORAL_TABLET | Freq: Every day | ORAL | 3 refills | Status: DC

## 2018-09-22 ENCOUNTER — Other Ambulatory Visit: Payer: Self-pay | Admitting: Oncology

## 2018-09-22 ENCOUNTER — Encounter: Payer: Self-pay | Admitting: Oncology

## 2018-09-23 ENCOUNTER — Telehealth: Payer: Self-pay | Admitting: Oncology

## 2018-09-23 NOTE — Telephone Encounter (Signed)
2nd attempt to contact pt tp r\s appt for 4/28. VM was left to contact office to get appt r\s due to [place] 19 precautions.

## 2018-09-27 ENCOUNTER — Ambulatory Visit: Payer: BLUE CROSS/BLUE SHIELD | Admitting: Oncology

## 2018-09-27 ENCOUNTER — Other Ambulatory Visit: Payer: BLUE CROSS/BLUE SHIELD

## 2018-09-27 ENCOUNTER — Ambulatory Visit: Payer: BLUE CROSS/BLUE SHIELD

## 2018-10-05 ENCOUNTER — Other Ambulatory Visit: Payer: Self-pay

## 2018-10-05 MED ORDER — QUETIAPINE FUMARATE ER 50 MG PO TB24: ORAL_TABLET | ORAL | 0 refills | Status: DC

## 2018-10-17 ENCOUNTER — Ambulatory Visit: Payer: Self-pay | Admitting: Family Medicine

## 2018-10-17 ENCOUNTER — Telehealth (INDEPENDENT_AMBULATORY_CARE_PROVIDER_SITE_OTHER): Payer: Managed Care, Other (non HMO) | Admitting: Family Medicine

## 2018-10-17 DIAGNOSIS — F4329 Adjustment disorder with other symptoms: Secondary | ICD-10-CM | POA: Diagnosis not present

## 2018-10-17 NOTE — Telephone Encounter (Signed)
fyi

## 2018-10-17 NOTE — Telephone Encounter (Signed)
Pt is scheduled/thx dmf

## 2018-10-17 NOTE — Telephone Encounter (Signed)
Pt. Reports her boss tested positive for COVID 19 last week. She reports she has occasional chest tightness, no other symptoms. Was told by work to call her PCP. Warm transfer to [place] in the practice.  Answer Assessment - Initial Assessment Questions 1. CLOSE CONTACT: "Who is the person with the confirmed or suspected COVID-19 infection that you were exposed to?"     Boss at work 2. PLACE of CONTACT: "Where were you when you were exposed to COVID-19?" (e.g., home, school, medical waiting room; which city?)     Work 3. TYPE of CONTACT: "How much contact was there?" (e.g., sitting next to, live in same house, work in same office, same building)     Office 4. DURATION of CONTACT: "How long were you in contact with the COVID-19 patient?" (e.g., a few seconds, passed by person, a few minutes, live with the patient)     Unsure 5. DATE of CONTACT: "When did you have contact with a COVID-19 patient?" (e.g., how many days ago)     Last week 6. TRAVEL: "Have you traveled out of the country recently?" If so, "When and where?"     * Also ask about out-of-state travel, since the CDC has identified some high risk cities for community spread in the [place].     * Note: Travel becomes less relevant if there is widespread community transmission where the patient lives.     No 7. COMMUNITY SPREAD: "Are there lots of cases of COVID-19 (community spread) where you live?" (See public health department website, if unsure)   * MAJOR community spread: high number of cases; numbers of cases are increasing; many people hospitalized.   * MINOR community spread: low number of cases; not increasing; few or no people hospitalized     Yes 8. SYMPTOMS: "Do you have any symptoms?" (e.g., fever, cough, breathing difficulty)     Chest tightness 9. PREGNANCY OR POSTPARTUM: "Is there any chance you are pregnant?" "When was your last menstrual period?" "Did you deliver in the last 2 weeks?"     No 10. HIGH RISK: "Do you have any  heart or lung problems? Do you have a weak immune system?" (e.g., CHF, COPD, asthma, HIV positive, chemotherapy, renal failure, diabetes mellitus, sickle cell anemia)       No  Protocols used: CORONAVIRUS (COVID-19) EXPOSURE-A-AH

## 2018-10-17 NOTE — Progress Notes (Signed)
Virtual Visit via Video   Due to the COVID-19 pandemic, this visit was completed with telemedicine (audio/video) technology to reduce patient and provider exposure as well as to preserve personal protective equipment.   I connected with Ellwood Handler by a video enabled telemedicine application and verified that I am speaking with the correct person using two identifiers. Location patient: Home Location provider: [facility], Office Persons participating in the virtual visit: Cathie Beams, MD   I discussed the limitations of evaluation and management by telemedicine and the availability of in person appointments. The patient expressed understanding and agreed to proceed.  Care Team   Patient Care Team: Lucille Passy, MD as PCP - General  Subjective:   HPI:   Anxiety- She is having problems with anxiety due to the Pandemic. She is asymtomatic but her boss and a coworker were confirmed to have. Her boss had symptoms but her coworker did not have symptoms- she was tested prior to surgery and was told she tested positive.  She has been having what seems to be panic attacks. It manifests as chest pain without any other Sx. She is quite worried as she also has a 79-year-old.   She not having any coughing, fevers or other symptoms.  Review of Systems  Constitutional: Negative.   HENT: Negative.   Eyes: Negative.   Respiratory: Negative.   Cardiovascular: Positive for chest pain. Negative for palpitations, orthopnea, claudication, leg swelling and PND.  Gastrointestinal: Negative.   Genitourinary: Negative.   Musculoskeletal: Negative.   Skin: Negative.   Neurological: Negative.   Endo/Heme/Allergies: Negative.   Psychiatric/Behavioral: Negative for depression, hallucinations, memory loss, substance abuse and suicidal ideas. The patient is nervous/anxious. The patient does not have insomnia.   All other systems reviewed and are negative.    Patient Active Problem  List   Diagnosis Date Noted  . Adjustment disorder with mixed emotional features 09/15/2017  . Iron deficiency anemia 06/18/2010  . ANEMIA, IRON DEFICIENCY, HX OF 06/18/2010    Social History   Tobacco Use  . Smoking status: Never Smoker  . Smokeless tobacco: Never Used  Substance Use Topics  . Alcohol use: No    Alcohol/week: 0.0 standard drinks    Current Outpatient Medications:  .  BLISOVI 24 FE 1-20 MG-MCG(24) tablet, TAKE 1 TABLET BY MOUTH EVERY DAY, Disp: 84 tablet, Rfl: 2 .  buPROPion (WELLBUTRIN XL) 150 MG 24 hr tablet, TAKE 1 TABLET BY MOUTH EVERY DAY, Disp: 90 tablet, Rfl: 1 .  QUEtiapine (SEROQUEL XR) 50 MG TB24 24 hr tablet, Take 1qhs (Plz sched Virtual Visit), Disp: 30 tablet, Rfl: 0  No Known Allergies  Objective:  LMP 09/26/2018   VITALS: Per patient if applicable, see vitals. GENERAL: Alert, appears well and in no acute distress. HEENT: Atraumatic, conjunctiva clear, no obvious abnormalities on inspection of external nose and ears. NECK: Normal movements of the head and neck. CARDIOPULMONARY: No increased WOB. Speaking in clear sentences. I:E ratio WNL.  MS: Moves all visible extremities without noticeable abnormality. PSYCH: Pleasant and cooperative, well-groomed. Speech normal rate and rhythm. Affect is appropriate. Insight and judgement are appropriate. Attention is focused, linear, and appropriate.  NEURO: CN grossly intact. Oriented as arrived to appointment on time with no prompting. Moves both UE equally.  SKIN: No obvious lesions, wounds, erythema, or cyanosis noted on face or hands.  Depression screen [facility] 2/9 10/17/2018 03/09/2018 09/15/2017  Decreased Interest 0 1 1  Down, Depressed, Hopeless 0 1  1  PHQ - 2 Score 0 2 2  Altered sleeping 0 2 0  Tired, decreased energy 0 3 3  Change in appetite 0 0 0  Feeling bad or failure about yourself  0 0 0  Trouble concentrating 0 0 0  Moving slowly or fidgety/restless 0 0 0  Suicidal thoughts 0 0 0  PHQ-9  Score 0 7 5  Difficult doing work/chores Not difficult at all Somewhat difficult Somewhat difficult   GAD 7 : Generalized Anxiety Score 10/17/2018 03/09/2018 09/15/2017  Nervous, Anxious, on Edge 1 1 1   Control/stop worrying 1 1 1   Worry too much - different things 0 1 1  Trouble relaxing 0 0 0  Restless 0 0 0  Easily annoyed or irritable 0 1 1  Afraid - awful might happen 0 0 0  Total GAD 7 Score 2 4 4   Anxiety Difficulty Somewhat difficult Somewhat difficult Somewhat difficult     Assessment and Plan:   Marjan was seen today for anxiety.  Diagnoses and all orders for this visit:  Adjustment disorder with mixed emotional features    . COVID-19 Education: The signs and symptoms of COVID-19 were discussed with the patient and how to seek care for testing if needed. The importance of social distancing was discussed today. . Reviewed expectations re: course of current medical issues. . Discussed self-management of symptoms. . Outlined signs and symptoms indicating need for more acute intervention. . Patient verbalized understanding and all questions were answered. Marland Kitchen Health Maintenance issues including appropriate healthy diet, exercise, and smoking avoidance were discussed with patient. . See orders for this visit as documented in the electronic medical record.  Arnette Norris, MD  Records requested if needed. Time spent: 25 minutes, of which >50% was spent in obtaining information about her symptoms, reviewing her previous labs, evaluations, and treatments, counseling her about her condition (please see the discussed topics above), and developing a plan to further investigate it; she had a number of questions which I addressed.

## 2018-10-17 NOTE — Assessment & Plan Note (Signed)
>  25 minutes spent in face to face time with patient, >50% spent in counselling or coordination of care Although her GAD7 and PHQ screens were negative, she does seem very anxious about possibly having covid.  I did give her the number to the health department as they are offering covid testing by appointment. She will all that number. The patient indicates understanding of these issues and agrees with the plan. GAD 7 : Generalized Anxiety Score 10/17/2018 03/09/2018 09/15/2017  Nervous, Anxious, on Edge 1 1 1   Control/stop worrying 1 1 1   Worry too much - different things 0 1 1  Trouble relaxing 0 0 0  Restless 0 0 0  Easily annoyed or irritable 0 1 1  Afraid - awful might happen 0 0 0  Total GAD 7 Score 2 4 4   Anxiety Difficulty Somewhat difficult Somewhat difficult Somewhat difficult      Video Visit from 10/17/2018 in [facility]  PHQ-9 Total Score  0

## 2018-11-03 ENCOUNTER — Encounter: Payer: Self-pay | Admitting: Family Medicine

## 2018-11-05 ENCOUNTER — Other Ambulatory Visit: Payer: Self-pay | Admitting: Family Medicine

## 2018-11-17 ENCOUNTER — Encounter: Payer: Self-pay | Admitting: Family Medicine

## 2018-11-21 ENCOUNTER — Other Ambulatory Visit: Payer: Self-pay

## 2018-11-21 DIAGNOSIS — R5383 Other fatigue: Secondary | ICD-10-CM

## 2018-12-01 ENCOUNTER — Encounter: Payer: Self-pay | Admitting: Family Medicine

## 2018-12-06 ENCOUNTER — Other Ambulatory Visit: Payer: Self-pay | Admitting: Family Medicine

## 2018-12-09 ENCOUNTER — Other Ambulatory Visit: Payer: Self-pay

## 2018-12-09 ENCOUNTER — Other Ambulatory Visit (INDEPENDENT_AMBULATORY_CARE_PROVIDER_SITE_OTHER): Payer: 59

## 2018-12-09 DIAGNOSIS — R5383 Other fatigue: Secondary | ICD-10-CM | POA: Diagnosis not present

## 2018-12-09 LAB — COMPREHENSIVE METABOLIC PANEL
ALT: 14 U/L (ref 0–35)
AST: 13 U/L (ref 0–37)
Albumin: 3.9 g/dL (ref 3.5–5.2)
Alkaline Phosphatase: 79 U/L (ref 39–117)
BUN: 11 mg/dL (ref 6–23)
CO2: 27 mEq/L (ref 19–32)
Calcium: 9 mg/dL (ref 8.4–10.5)
Chloride: 103 mEq/L (ref 96–112)
Creatinine, Ser: 0.71 mg/dL (ref 0.40–1.20)
GFR: 99.02 mL/min (ref >60.00)
Glucose, Bld: 83 mg/dL (ref 70–99)
Potassium: 4.9 mEq/L (ref 3.5–5.1)
Sodium: 139 mEq/L (ref 135–145)
Total Bilirubin: 0.5 mg/dL (ref 0.2–1.2)
Total Protein: 6.5 g/dL (ref 6.0–8.3)

## 2018-12-09 LAB — CBC WITH DIFFERENTIAL/PLATELET
Basophils Absolute: 0 10*3/uL (ref 0.0–0.1)
Basophils Relative: 0.3 % (ref 0.0–3.0)
Eosinophils Absolute: 0.1 10*3/uL (ref 0.0–0.7)
Eosinophils Relative: 0.8 % (ref 0.0–5.0)
HCT: 40.5 % (ref 36.0–46.0)
Hemoglobin: 13.1 g/dL (ref 12.0–15.0)
Lymphocytes Relative: 18.7 % (ref 12.0–46.0)
Lymphs Abs: 2.2 10*3/uL (ref 0.7–4.0)
MCHC: 32.3 g/dL (ref 30.0–36.0)
MCV: 92.7 fl (ref 78.0–100.0)
Monocytes Absolute: 0.6 10*3/uL (ref 0.1–1.0)
Monocytes Relative: 4.7 % (ref 3.0–12.0)
Neutro Abs: 9 10*3/uL — ABNORMAL HIGH (ref 1.4–7.7)
Neutrophils Relative %: 75.5 % (ref 43.0–77.0)
Platelets: 341 10*3/uL (ref 150.0–400.0)
RBC: 4.37 Mil/uL (ref 3.87–5.11)
RDW: 14 % (ref 11.5–15.5)
WBC: 11.9 10*3/uL — ABNORMAL HIGH (ref 4.0–10.5)

## 2018-12-09 LAB — VITAMIN D 25 HYDROXY (VIT D DEFICIENCY, FRACTURES): VITD: 34.17 ng/mL (ref 30.00–100.00)

## 2018-12-09 LAB — T4, FREE: Free T4: 0.9 ng/dL (ref 0.60–1.60)

## 2018-12-09 LAB — VITAMIN B12: Vitamin B-12: 100 pg/mL — ABNORMAL LOW (ref 211–911)

## 2018-12-09 LAB — TSH: TSH: 1.68 u[IU]/mL (ref 0.35–4.50)

## 2018-12-09 LAB — FERRITIN: Ferritin: 22.7 ng/mL (ref 10.0–291.0)

## 2018-12-27 ENCOUNTER — Encounter: Payer: Self-pay | Admitting: Family Medicine

## 2018-12-28 ENCOUNTER — Telehealth: Payer: Self-pay

## 2018-12-28 NOTE — Telephone Encounter (Signed)
Copied from [place] [id number]. Topic: General - Other >> Dec 28, 2018 12:56 PM Leward Quan A wrote: Reason for CRM: Patient called to say that she is in need of a B-12 shot and her insurance require her to give them a CPT code in order for them to tell her if they will pay for it or not also she would like to know the out of pocket cost incase her insurance does not cover the shot. Please call patient at  Ph# [phone]

## 2018-12-28 NOTE — Telephone Encounter (Signed)
TB-I sent this pt's MyChart message to you earlier today/plz advise/thx dmf

## 2019-01-01 NOTE — Progress Notes (Deleted)
[facility]  [phone] [phone]  ID: [name] OB: [birth year]  MR#: [id number]  UUV#:[id number]  Patient Care Team: Megan Passy, MD as PCP - General  CHIEF COMPLAINT: Iron deficiency anemia.  INTERVAL HISTORY: Patient returns to clinic today for repeat laboratory can routine six-month evaluation.  She continues to have chronic fatigue and feels "cold" all the time, but otherwise feels well.  She denies any recent fevers or illnesses.  She has no neurologic complaints. She denies any chest pain or shortness of breath. She denies any nausea, vomiting, constipation, or diarrhea. She has no melena or hematochezia. She has no urinary complaints.  Patient offers no further specific complaints today.  REVIEW OF SYSTEMS:   Review of Systems  Constitutional: Positive for malaise/fatigue. Negative for fever and weight loss.  Respiratory: Negative.  Negative for cough and shortness of breath.   Cardiovascular: Negative.  Negative for chest pain and leg swelling.  Gastrointestinal: Negative.  Negative for abdominal pain, blood in stool and melena.  Genitourinary: Negative.  Negative for hematuria.  Musculoskeletal: Negative.  Negative for back pain.  Skin: Negative.  Negative for rash.  Neurological: Negative.  Negative for focal weakness, weakness and headaches.  Psychiatric/Behavioral: Negative.  The patient is not nervous/anxious.     As per HPI. Otherwise, a complete review of systems is negative.  PAST MEDICAL HISTORY: Past Medical History:  Diagnosis Date  . Anemia   . Skin cancer     PAST SURGICAL HISTORY: Past Surgical History:  Procedure Laterality Date  . TONSILLECTOMY     age 66    FAMILY HISTORY: Sister with iron deficiency anemia, otherwise negative and noncontributory.     ADVANCED DIRECTIVES:    HEALTH MAINTENANCE: Social History   Tobacco Use  . Smoking status: Never Smoker  . Smokeless tobacco: Never Used   Substance Use Topics  . Alcohol use: No    Alcohol/week: 0.0 standard drinks  . Drug use: No     Colonoscopy:  PAP:  Bone density:  Lipid panel:  No Known Allergies  Current Outpatient Medications  Medication Sig Dispense Refill  . BLISOVI 24 FE 1-20 MG-MCG(24) tablet TAKE 1 TABLET BY MOUTH EVERY DAY 84 tablet 2  . buPROPion (WELLBUTRIN XL) 150 MG 24 hr tablet TAKE 1 TABLET BY MOUTH EVERY DAY 90 tablet 1  . QUEtiapine (SEROQUEL XR) 50 MG TB24 24 hr tablet TAKE 1 TABLET BY MOUTH EVERYDAY AT BEDTIME 90 tablet 1   No current facility-administered medications for this visit.     OBJECTIVE: There were no vitals filed for this visit.   There is no height or weight on file to calculate BMI.    ECOG FS:0 - Asymptomatic  General: Well-developed, well-nourished, no acute distress. Eyes: Pink conjunctiva, anicteric sclera. HEENT: Normocephalic, moist mucous membranes. Lungs: Clear to auscultation bilaterally. Heart: Regular rate and rhythm. No rubs, murmurs, or gallops. Abdomen: Soft, nontender, nondistended. No organomegaly noted, normoactive bowel sounds. Musculoskeletal: No edema, cyanosis, or clubbing. Neuro: Alert, answering all questions appropriately. Cranial nerves grossly intact. Skin: No rashes or petechiae noted. Psych: Normal affect.   LAB RESULTS:  Lab Results  Component Value Date   NA 139 10/12/2016   K 4.4 10/12/2016   CL 104 10/12/2016   CO2 30 10/12/2016   GLUCOSE 64 (L) 10/12/2016   BUN 9 10/12/2016   CREATININE 0.72 10/12/2016   CALCIUM 9.7 10/12/2016   PROT 6.8 10/12/2016   ALBUMIN 4.0 10/12/2016   AST  17 10/12/2016   ALT 17 10/12/2016   ALKPHOS 65 10/12/2016   BILITOT 0.3 10/12/2016   GFRNONAA >60 05/11/2015   GFRAA >60 05/11/2015    Lab Results  Component Value Date   WBC 8.3 03/09/2018   NEUTROABS 5.9 03/09/2018   HGB 13.9 03/09/2018   HCT 42.1 03/09/2018   MCV 90.2 03/09/2018   PLT 283.0 03/09/2018   Lab Results  Component Value Date    IRON 35 03/18/2017   TIBC 382 03/18/2017   IRONPCTSAT 9 (L) 03/18/2017    Lab Results  Component Value Date   FERRITIN 8.6 (L) 03/09/2018     STUDIES: No results found.  ASSESSMENT: Iron deficiency anemia  PLAN:    1. Iron deficiency anemia: Patient's hemoglobin continues to be within normal limits.  Ferritin is slightly decreased at approximately 9.  She does not require IV Feraheme today, but may require treatment at her next clinic visit.  Patient has been encouraged to continue her oral iron supplementation. She last received treatment on January 16, 2016. No intervention is needed.  Return to clinic in 6 months with repeat laboratory can further evaluation.  If her hemoglobin continues to be within normal limits, patient could possibly be discharged from clinic.   2.  B12 deficiency: Patient was encouraged to take B12 supplementation.  I spent a total of 20 minutes face-to-face with the patient of which greater than 50% of the visit was spent in counseling and coordination of care as detailed above.   Patient expressed understanding and was in agreement with this plan. She also understands that She can call clinic at any time with any questions, concerns, or complaints.    Megan Huger, MD   01/01/2019 11:02 AM

## 2019-01-06 ENCOUNTER — Inpatient Hospital Stay: Payer: Managed Care, Other (non HMO)

## 2019-01-06 ENCOUNTER — Inpatient Hospital Stay: Payer: Managed Care, Other (non HMO) | Admitting: Oncology

## 2019-01-06 ENCOUNTER — Ambulatory Visit: Payer: 59

## 2019-01-10 ENCOUNTER — Other Ambulatory Visit: Payer: Self-pay

## 2019-01-10 ENCOUNTER — Telehealth: Payer: Self-pay

## 2019-01-10 ENCOUNTER — Ambulatory Visit (INDEPENDENT_AMBULATORY_CARE_PROVIDER_SITE_OTHER): Payer: 59

## 2019-01-10 DIAGNOSIS — D510 Vitamin B12 deficiency anemia due to intrinsic factor deficiency: Secondary | ICD-10-CM

## 2019-01-10 MED ORDER — CYANOCOBALAMIN 1000 MCG/ML IJ SOLN
1000.0000 ug | Freq: Once | INTRAMUSCULAR | Status: AC
  Administered 2019-01-10: 1000 ug via INTRAMUSCULAR

## 2019-01-10 NOTE — Telephone Encounter (Signed)
TA-Pt received 1st B-12 injection in her right deltoid today/when should she get her next injection? Plz advise/thx dmf

## 2019-01-10 NOTE — Progress Notes (Signed)
After obtaining consent, and per orders of Dr. Deborra Medina, injection of B-12 given in her right deltoid by Calyb Mcquarrie Berneta Sages. Patient instructed to remain in clinic for 20 minutes afterwards, and to report any adverse reaction to me immediately. Thx dmf

## 2019-01-10 NOTE — Telephone Encounter (Signed)
In 2 weeks.

## 2019-01-11 NOTE — Progress Notes (Signed)
Medical screening examination/treatment/procedure(s) were performed by the medical assistant. I was immediately available for consulation/collaboration. I agree with above documentation. Wilfred Lacy, AGNP-C

## 2019-01-16 NOTE — Telephone Encounter (Signed)
Sent pt MyChart message/thx dmf

## 2019-01-24 ENCOUNTER — Encounter: Payer: Self-pay | Admitting: Family Medicine

## 2019-01-25 ENCOUNTER — Telehealth: Payer: Self-pay

## 2019-01-25 ENCOUNTER — Other Ambulatory Visit: Payer: Self-pay

## 2019-01-25 ENCOUNTER — Encounter: Payer: 59 | Admitting: Family Medicine

## 2019-01-25 ENCOUNTER — Ambulatory Visit (INDEPENDENT_AMBULATORY_CARE_PROVIDER_SITE_OTHER): Payer: 59 | Admitting: Nurse Practitioner

## 2019-01-25 ENCOUNTER — Encounter: Payer: Self-pay | Admitting: Family Medicine

## 2019-01-25 ENCOUNTER — Encounter: Payer: Self-pay | Admitting: Nurse Practitioner

## 2019-01-25 VITALS — BP 108/84 | HR 70 | Temp 98.5°F | Ht 64.0 in | Wt 169.2 lb

## 2019-01-25 DIAGNOSIS — R1013 Epigastric pain: Secondary | ICD-10-CM | POA: Diagnosis not present

## 2019-01-25 MED ORDER — GI COCKTAIL ~~LOC~~: 30.0000 mL | Freq: Once | ORAL | 0 refills | Status: AC

## 2019-01-25 MED ORDER — OMEPRAZOLE 20 MG PO CPDR: 20.0000 mg | DELAYED_RELEASE_CAPSULE | Freq: Two times a day (BID) | ORAL | 0 refills | Status: DC

## 2019-01-25 NOTE — Telephone Encounter (Signed)

## 2019-01-25 NOTE — Patient Instructions (Signed)
Prescriptions faxed to [facility] in [place].  Maintain Brat diet x 2days, than advance as tolerated  Call office if no improvement in 1week.   Bland Diet A bland diet consists of foods that are often soft and do not have a lot of fat, fiber, or extra seasonings. Foods without fat, fiber, or seasoning are easier for the body to digest. They are also less likely to irritate your mouth, throat, stomach, and other parts of your digestive system. A bland diet is sometimes called a BRAT diet. What is my plan? Your health care provider or food and nutrition specialist (dietitian) may recommend specific changes to your diet to prevent symptoms or to treat your symptoms. These changes may include:  Eating small meals often.  Cooking food until it is soft enough to chew easily.  Chewing your food well.  Drinking fluids slowly.  Not eating foods that are very spicy, sour, or fatty.  Not eating citrus fruits, such as oranges and grapefruit. What do I need to know about this diet?  Eat a variety of foods from the bland diet food list.  Do not follow a bland diet longer than needed.  Ask your health care provider whether you should take vitamins or supplements. What foods can I eat? Grains  Hot cereals, such as cream of wheat. Rice. Bread, crackers, or tortillas made from refined white flour. Vegetables Canned or cooked vegetables. Mashed or boiled potatoes. Fruits  Bananas. Applesauce. Other types of cooked or canned fruit with the skin and seeds removed, such as canned peaches or pears. Meats and other proteins  Scrambled eggs. Creamy peanut butter or other nut butters. Lean, well-cooked meats, such as chicken or fish. Tofu. Soups or broths. Dairy Low-fat dairy products, such as milk, cottage cheese, or yogurt. Beverages  Water. Herbal tea. Apple juice. Fats and oils Mild salad dressings. Canola or olive oil. Sweets and desserts Pudding. Custard. Fruit gelatin. Ice  cream. The items listed above may not be a complete list of recommended foods and beverages. Contact a dietitian for more options. What foods are not recommended? Grains Whole grain breads and cereals. Vegetables Raw vegetables. Fruits Raw fruits, especially citrus, berries, or dried fruits. Dairy Whole fat dairy foods. Beverages Caffeinated drinks. Alcohol. Seasonings and condiments Strongly flavored seasonings or condiments. Hot sauce. Salsa. Other foods Spicy foods. Fried foods. Sour foods, such as pickled or fermented foods. Foods with high sugar content. Foods high in fiber. The items listed above may not be a complete list of foods and beverages to avoid. Contact a dietitian for more information. Summary  A bland diet consists of foods that are often soft and do not have a lot of fat, fiber, or extra seasonings.  Foods without fat, fiber, or seasoning are easier for the body to digest.  Check with your health care provider to see how long you should follow this diet plan. It is not meant to be followed for long periods. This information is not intended to replace advice given to you by your health care provider. Make sure you discuss any questions you have with your health care provider. Document Released: 09/09/2015 Document Revised: 06/16/2017 Document Reviewed: 06/16/2017 Elsevier Patient Education  2020 [place] is a feeling of pain, discomfort, burning, or fullness in the upper part of your belly (abdomen). It can come and go. It may occur often or rarely. Indigestion tends to happen while you are eating or right after you have finished  eating. Indigestion may be a symptom of another condition. It may be worse:  At night.  When bending over.  While lying down. Follow these instructions at home: Eating and drinking   Follow an eating plan as told by your doctor.  You may need to avoid foods and drinks such as: ? Chocolate and  cocoa. ? Peppermint and mint flavorings. ? Garlic and onions. ? Horseradish. ? Spicy and acidic foods, such as:  Peppers.  Chili powder and curry powder.  Vinegar.  Hot sauces and BBQ sauce. ? Citrus fruits, such as:  Oranges.  Lemons.  Limes. ? Tomato-based foods, such as:  Red sauce and pizza with red sauce.  Chili.  Salsa. ? Fried and fatty foods, such as:  Donuts.  [place] fries and potato chips.  High-fat dressings. ? High-fat meats, such as:  Hot dogs and sausage.  Rib eye steak.  Ham and bacon. ? High-fat dairy items, such as:  Whole milk.  Butter.  Cream cheese. ? Coffee and tea (with or without caffeine). ? Drinks that contain alcohol. ? Energy drinks and sports drinks. ? Carbonated drinks or sodas. ? Citrus fruit juices.  Eat small meals often. Avoid eating large meals.  Avoid drinking large amounts of liquid with your meals.  Avoid eating meals during the 2-3 hours before bedtime.  Avoid lying down right after you eat.  Avoid exercise for 2 hours after you eat. Lifestyle      Maintain a healthy weight. Ask your doctor what weight is healthy for you. If you need to lose weight, work with your doctor.  Exercise for at least 30 minutes on 5 or more days each week, or as told by your doctor. ? Avoid exercises that include bending forward. This can make your symptoms worse.  Wear loose clothes. Do not wear anything tight around your waist.  Do not use any products that contain nicotine or tobacco, including cigarettes, e-cigarettes, and chewing tobacco. These can make your symptoms worse. If you need help quitting, ask your doctor.  Raise (elevate) the head of your bed about 6 inches (15 cm) when you sleep.  Try to lower your stress. If you need help doing this, ask your doctor. General instructions  Take over-the-counter and prescription medicines only as told by your doctor. ? Do not take aspirin, ibuprofen, or other NSAIDs  unless your doctor says it is okay.  Pay attention to any changes in your symptoms.  Keep all follow-up visits as told by your doctor. This is important. Contact a doctor if:  You have new symptoms.  You lose weight and you do not know why it is happening.  You have trouble swallowing, or it hurts to swallow.  Your symptoms do not get better with treatment.  Your symptoms last for more than 2 days.  You have a fever.  You throw up (vomit). Get help right away if:  You have pain in your arms, neck, jaw, teeth, or back.  You feel sweaty, dizzy, or light-headed.  You pass out (faint).  You have chest pain or shortness of breath.  You cannot stop throwing up, or you throw up blood.  Your poop (stool) is bloody or black.  You have very bad pain in your belly. These symptoms may represent a serious problem that is an emergency. Do not wait to see if the symptoms will go away. Get medical help right away. Call your local emergency services (911 in the [place] Do not drive yourself  to the hospital. Summary  Indigestion is a feeling of pain, discomfort, burning, or fullness in the upper part of your belly. It tends to happen while you are eating or right after you have finished eating.  Follow an eating plan and other lifestyle changes as told by your doctor.  Take over-the-counter and prescription medicines only as told by your doctor. Do not take aspirin, ibuprofen, or other NSAIDs unless your doctor says it is okay.  Contact your doctor if your symptoms do not get better or they get worse.  Some symptoms may represent a serious problem that is an emergency. Do not wait to see if the symptoms will go away. Get medical help right away. This information is not intended to replace advice given to you by your health care provider. Make sure you discuss any questions you have with your health care provider. Document Released: 06/20/2010 Document Revised: 10/18/2017 Document  Reviewed: 10/18/2017 Elsevier Patient Education  2020 [employer]

## 2019-01-25 NOTE — Progress Notes (Signed)
Opened in error. Please disregard.

## 2019-01-25 NOTE — Progress Notes (Signed)
Subjective:  Patient ID: Natalie Wu, female    DOB: 11/03/1990  Age: 28 y.o. MRN: [id number]  CC: Heartburn (pt is c/o of acide reflux,painful under breast area go around back/heat pad,ibuprofen,famotidine and prilozac--didnt help. going on for a while but getting worse last 2 times. )  Heartburn She complains of abdominal pain, belching, globus sensation, heartburn and nausea. She reports no chest pain, no choking, no coughing, no dysphagia, no early satiety, no hoarse voice, no sore throat, no stridor, no tooth decay, no water brash or no wheezing. This is a recurrent problem. The current episode started more than 1 month ago. The problem occurs occasionally. The problem has been waxing and waning. The heartburn duration is more than one hour. The heartburn is located in the substernum and abdomen. The heartburn is of severe intensity. The heartburn does not wake her from sleep. The heartburn does not limit her activity. The heartburn doesn't change with position. The symptoms are aggravated by certain foods, lying down and stress. Pertinent negatives include no anemia, fatigue, melena, muscle weakness, orthopnea or weight loss. Risk factors include NSAIDs. She has tried a PPI and a histamine-2 antagonist for the symptoms. The treatment provided no relief. Past procedures do not include an abdominal ultrasound, an EGD, esophageal manometry, esophageal pH monitoring, H. pylori antibody titer or a UGI.   Reviewed past Medical, Social and Family history today.  Outpatient Medications Prior to Visit  Medication Sig Dispense Refill  . ALPRAZolam (XANAX) 0.5 MG tablet Take 1 tablet (0.5 mg total) by mouth 3 (three) times daily as needed for anxiety. 20 tablet 0  . Iron-Vitamin C (VITRON-C) 65-125 MG TABS Take 1 tablet by mouth every morning.    . Norgestimate-Ethinyl Estradiol Triphasic (TRI-PREVIFEM) 0.18/0.215/0.25 MG-35 MCG tablet Take 1 tablet by mouth daily. 84 tablet 3   No  facility-administered medications prior to visit.     ROS See HPI  Objective:  BP 108/84   Pulse 70   Temp 98.5 F (36.9 C) (Oral)   Ht 5\' 4"  (1.626 m)   Wt 169 lb 3.2 oz (76.7 kg)   SpO2 98%   BMI 29.04 kg/m   BP Readings from Last 3 Encounters:  01/25/19 108/84  08/02/18 110/82  07/11/18 122/84    Wt Readings from Last 3 Encounters:  01/25/19 169 lb 3.2 oz (76.7 kg)  08/02/18 174 lb 12.8 oz (79.3 kg)  07/11/18 170 lb (77.1 kg)    Physical Exam Vitals signs reviewed.  Cardiovascular:     Rate and Rhythm: Normal rate and regular rhythm.     Pulses: Normal pulses.     Heart sounds: Normal heart sounds.  Pulmonary:     Effort: Pulmonary effort is normal.     Breath sounds: Normal breath sounds.  Abdominal:     General: Bowel sounds are normal. There is no distension.     Palpations: Abdomen is soft.     Tenderness: There is abdominal tenderness. There is no right CVA tenderness, left CVA tenderness or guarding.  Skin:    General: Skin is warm and dry.     Findings: No erythema or rash.  Neurological:     Mental Status: She is alert and oriented to person, place, and time.  Psychiatric:        Mood and Affect: Mood normal.        Behavior: Behavior normal.        Thought Content: Thought content normal.    Lab Results  Component Value Date   WBC 11.9 (H) 12/09/2018   HGB 13.1 12/09/2018   HCT 40.5 12/09/2018   PLT 341.0 12/09/2018   GLUCOSE 83 12/09/2018   CHOL 227 (H) 04/19/2014   TRIG 161.0 (H) 04/19/2014   HDL 68.70 04/19/2014   LDLDIRECT 120.5 08/10/2012   LDLCALC 126 (H) 04/19/2014   ALT 14 12/09/2018   AST 13 12/09/2018   NA 139 12/09/2018   K 4.9 12/09/2018   CL 103 12/09/2018   CREATININE 0.71 12/09/2018   BUN 11 12/09/2018   CO2 27 12/09/2018   TSH 1.68 12/09/2018    Assessment & Plan:   Natalie Wu was seen today for heartburn.  Diagnoses and all orders for this visit:  Dyspepsia -     Alum & Mag Hydroxide-Simeth (GI COCKTAIL) SUSP  suspension; Take 30 mLs by mouth once for 1 dose. Combine 12ml Maalox, 12ml viscous lidocaine, and 20mg  capsule Dicyclomine. Total volume of 30ml. Shake well. -     omeprazole (PRILOSEC) 20 MG capsule; Take 1 capsule (20 mg total) by mouth 2 (two) times daily before a meal.   I am having Natalie Wu start on gi cocktail and omeprazole. I am also having her maintain her ALPRAZolam, Iron-Vitamin C, and Norgestimate-Ethinyl Estradiol Triphasic.  Meds ordered this encounter  Medications  . Alum & Mag Hydroxide-Simeth (GI COCKTAIL) SUSP suspension    Sig: Take 30 mLs by mouth once for 1 dose. Combine 12ml Maalox, 12ml viscous lidocaine, and 20mg  capsule Dicyclomine. Total volume of 30ml. Shake well.    Dispense:  30 mL    Refill:  0    Order Specific Question:   Supervising Provider    Answer:   Dianne DunARON, TALIA M [[id number]]  . omeprazole (PRILOSEC) 20 MG capsule    Sig: Take 1 capsule (20 mg total) by mouth 2 (two) times daily before a meal.    Dispense:  28 capsule    Refill:  0    Order Specific Question:   Supervising Provider    Answer:   Dianne DunARON, TALIA M [[id number]]    Problem List Items Addressed This Visit    None    Visit Diagnoses    Dyspepsia    -  Primary   Relevant Medications   Alum & Mag Hydroxide-Simeth (GI COCKTAIL) SUSP suspension   omeprazole (PRILOSEC) 20 MG capsule       Follow-up: Return if symptoms worsen or fail to improve.  Alysia Pennaharlotte Albin Duckett, NP

## 2019-01-26 ENCOUNTER — Ambulatory Visit: Payer: 59 | Admitting: Nurse Practitioner

## 2019-02-03 ENCOUNTER — Encounter: Payer: Self-pay | Admitting: Nurse Practitioner

## 2019-02-08 DIAGNOSIS — H52213 Irregular astigmatism, bilateral: Secondary | ICD-10-CM | POA: Diagnosis not present

## 2019-02-24 ENCOUNTER — Encounter: Payer: Self-pay | Admitting: Nurse Practitioner

## 2019-02-24 DIAGNOSIS — K219 Gastro-esophageal reflux disease without esophagitis: Secondary | ICD-10-CM

## 2019-02-24 DIAGNOSIS — R1013 Epigastric pain: Secondary | ICD-10-CM

## 2019-02-25 MED ORDER — GI COCKTAIL ~~LOC~~: 30.0000 mL | Freq: Once | ORAL | 0 refills | Status: AC

## 2019-02-25 MED ORDER — OMEPRAZOLE 20 MG PO CPDR: 20.0000 mg | DELAYED_RELEASE_CAPSULE | Freq: Two times a day (BID) | ORAL | 1 refills | Status: DC

## 2019-02-27 ENCOUNTER — Telehealth: Payer: Self-pay | Admitting: Nurse Practitioner

## 2019-02-27 MED ORDER — GI COCKTAIL ~~LOC~~: 30.0000 mL | Freq: Once | ORAL | 0 refills | Status: AC

## 2019-02-27 NOTE — Telephone Encounter (Signed)
Pt is aware.   Pharmacy stated omeprazole already there but they never got the GI Cocktail. Rx resent.

## 2019-02-27 NOTE — Telephone Encounter (Signed)
Rx waiting for signature, cant sent it by electronic.    Copied from [place] [id number]. Topic: General - Inquiry >> Feb 27, 2019 11:15 AM Percell Belt A wrote: Reason for CRM: pt called in stated that she rec'd a my chart Message Friday from [place] that a  GI cocktail was [facility]. She stated she has checked with them and it is not there .  Please advise  Best number  [phone]

## 2019-03-06 ENCOUNTER — Encounter: Payer: Self-pay | Admitting: Nurse Practitioner

## 2019-03-13 ENCOUNTER — Other Ambulatory Visit: Payer: Self-pay

## 2019-03-13 ENCOUNTER — Ambulatory Visit: Payer: Managed Care, Other (non HMO) | Admitting: Family Medicine

## 2019-03-13 ENCOUNTER — Other Ambulatory Visit (INDEPENDENT_AMBULATORY_CARE_PROVIDER_SITE_OTHER): Payer: Managed Care, Other (non HMO)

## 2019-03-13 DIAGNOSIS — D508 Other iron deficiency anemias: Secondary | ICD-10-CM | POA: Diagnosis not present

## 2019-03-13 DIAGNOSIS — D509 Iron deficiency anemia, unspecified: Secondary | ICD-10-CM

## 2019-03-13 LAB — CBC WITH DIFFERENTIAL/PLATELET
Basophils Absolute: 0 10*3/uL (ref 0.0–0.1)
Basophils Relative: 0.3 % (ref 0.0–3.0)
Eosinophils Absolute: 0 10*3/uL (ref 0.0–0.7)
Eosinophils Relative: 0.5 % (ref 0.0–5.0)
HCT: 36.2 % (ref 36.0–46.0)
Hemoglobin: 12 g/dL (ref 12.0–15.0)
Lymphocytes Relative: 27.5 % (ref 12.0–46.0)
Lymphs Abs: 2.1 10*3/uL (ref 0.7–4.0)
MCHC: 33.1 g/dL (ref 30.0–36.0)
MCV: 86.1 fl (ref 78.0–100.0)
Monocytes Absolute: 0.3 10*3/uL (ref 0.1–1.0)
Monocytes Relative: 4.2 % (ref 3.0–12.0)
Neutro Abs: 5 10*3/uL (ref 1.4–7.7)
Neutrophils Relative %: 67.5 % (ref 43.0–77.0)
Platelets: 314 10*3/uL (ref 150.0–400.0)
RBC: 4.21 Mil/uL (ref 3.87–5.11)
RDW: 13.5 % (ref 11.5–15.5)
WBC: 7.5 10*3/uL (ref 4.0–10.5)

## 2019-03-13 LAB — FERRITIN: Ferritin: 2.4 ng/mL — ABNORMAL LOW (ref 10.0–291.0)

## 2019-03-13 LAB — VITAMIN B12: Vitamin B-12: 151 pg/mL — ABNORMAL LOW (ref 211–911)

## 2019-03-13 LAB — VITAMIN D 25 HYDROXY (VIT D DEFICIENCY, FRACTURES): VITD: 21.36 ng/mL — ABNORMAL LOW (ref 30.00–100.00)

## 2019-03-13 NOTE — Progress Notes (Signed)
Ordered per TA to discuss at OV/thx dmf

## 2019-03-14 LAB — IRON,TIBC AND FERRITIN PANEL
%SAT: 5 % (calc) — ABNORMAL LOW (ref 16–45)
Ferritin: 3 ng/mL — ABNORMAL LOW (ref 16–154)
Iron: 26 ug/dL — ABNORMAL LOW (ref 40–190)
TIBC: 542 mcg/dL (calc) — ABNORMAL HIGH (ref 250–450)

## 2019-03-15 ENCOUNTER — Encounter: Payer: Self-pay | Admitting: Family Medicine

## 2019-03-15 ENCOUNTER — Other Ambulatory Visit: Payer: Self-pay

## 2019-03-15 MED ORDER — CHOLECALCIFEROL 1.25 MG (50000 UT) PO TABS: ORAL_TABLET | ORAL | 0 refills | Status: DC

## 2019-03-16 ENCOUNTER — Other Ambulatory Visit: Payer: Self-pay | Admitting: Family Medicine

## 2019-03-16 DIAGNOSIS — D508 Other iron deficiency anemias: Secondary | ICD-10-CM

## 2019-03-17 ENCOUNTER — Telehealth: Payer: Self-pay | Admitting: Family

## 2019-03-17 NOTE — Telephone Encounter (Signed)
Spoke with patient to confirm new patient appt date/time/location 10/22 at 0830

## 2019-03-20 ENCOUNTER — Encounter: Payer: Self-pay | Admitting: Family Medicine

## 2019-03-20 ENCOUNTER — Ambulatory Visit (INDEPENDENT_AMBULATORY_CARE_PROVIDER_SITE_OTHER): Payer: 59 | Admitting: Family Medicine

## 2019-03-20 ENCOUNTER — Other Ambulatory Visit: Payer: Self-pay

## 2019-03-20 VITALS — BP 120/90 | HR 68 | Temp 99.3°F | Ht 64.0 in | Wt 175.4 lb

## 2019-03-20 DIAGNOSIS — R1013 Epigastric pain: Secondary | ICD-10-CM | POA: Diagnosis not present

## 2019-03-20 DIAGNOSIS — G8929 Other chronic pain: Secondary | ICD-10-CM

## 2019-03-20 DIAGNOSIS — R112 Nausea with vomiting, unspecified: Secondary | ICD-10-CM | POA: Insufficient documentation

## 2019-03-20 DIAGNOSIS — R519 Headache, unspecified: Secondary | ICD-10-CM

## 2019-03-20 DIAGNOSIS — R1011 Right upper quadrant pain: Secondary | ICD-10-CM

## 2019-03-20 DIAGNOSIS — R109 Unspecified abdominal pain: Secondary | ICD-10-CM | POA: Insufficient documentation

## 2019-03-20 LAB — COMPREHENSIVE METABOLIC PANEL
ALT: 14 U/L (ref 0–35)
AST: 13 U/L (ref 0–37)
Albumin: 4.1 g/dL (ref 3.5–5.2)
Alkaline Phosphatase: 75 U/L (ref 39–117)
BUN: 12 mg/dL (ref 6–23)
CO2: 27 mEq/L (ref 19–32)
Calcium: 9.5 mg/dL (ref 8.4–10.5)
Chloride: 102 mEq/L (ref 96–112)
Creatinine, Ser: 0.69 mg/dL (ref 0.40–1.20)
GFR: 102.13 mL/min (ref >60.00)
Glucose, Bld: 81 mg/dL (ref 70–99)
Potassium: 3.8 mEq/L (ref 3.5–5.1)
Sodium: 137 mEq/L (ref 135–145)
Total Bilirubin: 0.3 mg/dL (ref 0.2–1.2)
Total Protein: 6.9 g/dL (ref 6.0–8.3)

## 2019-03-20 LAB — H. PYLORI ANTIBODY, IGG: H Pylori IgG: NEGATIVE

## 2019-03-20 LAB — LIPASE: Lipase: 13 U/L (ref 11.0–59.0)

## 2019-03-20 MED ORDER — SUMATRIPTAN SUCCINATE 50 MG PO TABS: 50.0000 mg | ORAL_TABLET | ORAL | 0 refills | Status: DC | PRN

## 2019-03-20 MED ORDER — ONDANSETRON HCL 8 MG PO TABS: 8.0000 mg | ORAL_TABLET | Freq: Three times a day (TID) | ORAL | 0 refills | Status: DC | PRN

## 2019-03-20 NOTE — Assessment & Plan Note (Signed)
It appears she is having more than one type of headache- tension, medication overuse headaches (STOP EXCEDRIN), and a few migraines. I advised her to keep a headache journal- see AVS for details.  eRx sent for imitrex to take only at the first sign of one of her headaches associated with photophobia.  Also sent in a prescription for zofran for nausea. The patient indicates understanding of these issues and agrees with the plan.

## 2019-03-20 NOTE — Assessment & Plan Note (Addendum)
>  40 minutes spent in face to face time with patient, >50% spent in counselling or coordination of care discussing abdominal pain, dyspepsia,  headaches, nausea and vomiting. In terms of her abdominal pain- her symptoms seem quite consistent with biliary colic- they wake her from sleep, food does not make them better.  I will order a stat RUQ [place].  Also check stat labs including lipase, H pylori, CMET.  Advised her to STOP taking NSAIDs.  See under headaches. Referral also placed for GI as she will likely need an endoscopy. The patient indicates understanding of these issues and agrees with the plan. Orders Placed This Encounter  Procedures  . US Abdomen Limited RUQ  . H. pylori antibody, IgG  . Comprehensive metabolic panel  . Lipase  . Ambulatory referral to Gastroenterology

## 2019-03-20 NOTE — Patient Instructions (Addendum)
Helicobacter Pylori Infection Helicobacter pylori infection is a bacterial infection in the stomach. Long-term (chronic) infection can cause stomach irritation (gastritis), ulcers in the stomach (gastric ulcers), and ulcers in the upper part of the intestine (duodenal ulcers). Having this infection may also increase your risk of stomach cancer and a type of white blood cell cancer (lymphoma) that affects the stomach. What are the causes? This infection is caused by the Helicobacter pylori (H. pylori) bacteria. Many healthy people have this bacteria in their stomach lining. The bacteria may also spread from person to person through contact with stool (feces) or saliva. It is not known why some people develop ulcers, gastritis, or cancer from the bacteria. What increases the risk? You are more likely to develop this condition if you:  Have family members with the infection.  Live with many other people, such as in a dormitory.  Are of African, Hispanic, or Asian descent. What are the signs or symptoms? Most people with this infection do not have any symptoms. If you do have symptoms, they may include:  Heartburn.  Stomach pain.  Nausea.  Vomiting. The vomit may be bloody because of ulcers.  Loss of appetite.  Bad breath. How is this diagnosed? This condition may be diagnosed based on:  Your symptoms and medical history.  A physical exam.  Blood tests.  Stool tests.  A breath test.  A procedure that involves placing a tube with a camera on the end of it down your throat to examine your stomach and upper intestine (upper endoscopy).  Removing and testing a tissue sample from the stomach lining (biopsy). A biopsy may be taken during an upper endoscopy. How is this treated?  This condition is treated by taking a combination of medicines (triple therapy) for several weeks. Triple therapy includes one medicine to reduce the amount of acid in your stomach and two types of  antibiotic medicines. This treatment may reduce your risk of cancer. You may need to be tested for H. pylori again after treatment. In some cases, the treatment may need to be repeated if your treatment did not get rid of all the bacteria. Follow these instructions at home:   Take over-the-counter and prescription medicines only as told by your health care provider.  Take your antibiotics as told by your health care provider. Do not stop taking the antibiotics even if you start to feel better.  Return to your normal activities as told by your health care provider. Ask your health care provider what activities are safe for you.  Take steps to prevent future infections: ? Wash your hands often with soap and water. If soap and water are not available, use hand sanitizer. ? Do not eat food or drink water that may have had contact with stool or saliva.  Keep all follow-up visits as told by your health care provider. This is important. You may need tests to make sure your treatment worked. Contact a health care provider if your symptoms:  Do not get better with treatment.  Return after treatment. Summary  Helicobacter pylori infection is a stomach infection caused by the Helicobacter pylori (H. pylori) bacteria.  This infection can cause stomach irritation (gastritis), ulcers in the stomach (gastric ulcers), and ulcers in the upper part of the intestine (duodenal ulcers).  This condition is treated by taking a combination of medicines (triple therapy) for several weeks.  Take your antibiotics as told by your health care provider. Do not stop taking the antibiotics even if   you start to feel better. This information is not intended to replace advice given to you by your health care provider. Make sure you discuss any questions you have with your health care provider. Document Released: 09/09/2015 Document Revised: 09/08/2018 Document Reviewed: 05/11/2017 Elsevier Patient Education  2020  [place] to see you. I will call you with your lab results from today. Keep a headache journal- when  You had the headache, where it is located, was it associated with sensitivity to light, sound, smells, nausea or vomiting.  If you do get a headache associated with one of those symptoms, take Imitrex right away.  I want you to avoid excedrin migraine when at all possible.  I have referred you for a stat RUQ ultrasound, GI referral. Also stat labs including H pylori, blood sugar, liver function, lipase (pancreas).

## 2019-03-20 NOTE — Progress Notes (Signed)
Subjective:   Patient ID: Natalie Wu, female    DOB: Jul 18, 1990, 28 y.o.   MRN: [id number]  Karimah Winquist is a pleasant 28 y.o. year old female who presents to clinic today with Abdominal Pain (Pt has been taking medication, omeprazole for stomach pain.  Pt explains that she has changed her diet but it is still bothering her.), low blood sugars (Pt c/o  shakiness, sweating. Going on for 2 months now), and Migraine (x 7 weeks ago.  Pt has been taking excedrine for the migraine and explains that it does help some.)  on 03/20/2019  HPI:  She would like to discuss numerous things today. She is not happy because her insurance company does not cover virtual visits and our office told her that they would.  Dyspepsia- Saw Wilfred Lacy for this on 01/25/19- note reviewed.  At that time she complained for the following per Charlotte's HPI:  "Heartburn She complains of abdominal pain, belching, globus sensation, heartburn and nausea. She reports no chest pain, no choking, no coughing, no dysphagia, no early satiety, no hoarse voice, no sore throat, no stridor, no tooth decay, no water brash or no wheezing. This is a recurrent problem. The current episode started more than 1 month ago. The problem occurs occasionally. The problem has been waxing and waning. The heartburn duration is more than one hour. The heartburn is located in the substernum and abdomen. The heartburn is of severe intensity. The heartburn does not wake her from sleep. The heartburn does not limit her activity. The heartburn doesn't change with position. The symptoms are aggravated by certain foods, lying down and stress. Pertinent negatives include no anemia, fatigue, melena, muscle weakness, orthopnea or weight loss. Risk factors include NSAIDs. She has tried a PPI and a histamine-2 antagonist for the symptoms. The treatment provided no relief. Past procedures do not include an abdominal ultrasound, an EGD, esophageal manometry,  esophageal pH monitoring, H. pylori antibody titer or a UGI. "  Baldo Ash started her on a GI cocktail and Prilosec twice daily before meals. No labs or further work up done.  Pt called back on 02/03/19 complaining of persistent abdominal pain and burping.  She wrote again on 02/24/19, saying that pain is persisting but the "pills did kind of help."  [facility] refilled GI cocktail and omeprazole 20 mg twice daily.    Patient wrote back on 03/06/19 asking for an ultrasound of her stomach and was referred back to see me, her PCP.  She has cut out all spicy food, alcohol and she is still having this pain.  Pain is usually an 8/10.  Nothing makes it better when she is having the attack- just lasts for hours. No black or bloody stools.  Almost always wakes her up at night.  Often associated with vomiting.    Migraines- started a couple months ago.  Some associated with photophobia, no nausea or vomiting. Had 7 headaches last month, only 3 associated with sensitivity to light. She does not think they are menstrual.     Current Outpatient Medications on File Prior to Visit  Medication Sig Dispense Refill  . ALPRAZolam (XANAX) 0.5 MG tablet Take 1 tablet (0.5 mg total) by mouth 3 (three) times daily as needed for anxiety. 20 tablet 0  . aspirin-acetaminophen-caffeine (EXCEDRIN MIGRAINE) 250-250-65 MG tablet Take 1 tablet by mouth every 6 (six) hours as needed for headache.    . Iron-Vitamin C (VITRON-C) 65-125 MG TABS Take 1 tablet by mouth every morning.    Marland Kitchen  Norgestimate-Ethinyl Estradiol Triphasic (TRI-PREVIFEM) 0.18/0.215/0.25 MG-35 MCG tablet Take 1 tablet by mouth daily. 84 tablet 3  . omeprazole (PRILOSEC) 20 MG capsule Take 1 capsule (20 mg total) by mouth 2 (two) times daily before a meal. 60 capsule 1   No current facility-administered medications on file prior to visit.     No Known Allergies  Past Medical History:  Diagnosis Date  . Iron deficiency anemia     Past Surgical  History:  Procedure Laterality Date  . TONSILLECTOMY      Family History  Problem Relation Age of Onset  . Breast cancer Maternal Uncle   . Bone cancer Maternal Grandmother     Social History   Socioeconomic History  . Marital status: Single    Spouse name: Not on file  . Number of children: Not on file  . Years of education: Not on file  . Highest education level: Not on file  Occupational History  . Not on file  Social Needs  . Financial resource strain: Not on file  . Food insecurity    Worry: Not on file    Inability: Not on file  . Transportation needs    Medical: Not on file    Non-medical: Not on file  Tobacco Use  . Smoking status: Never Smoker  . Smokeless tobacco: Never Used  Substance and Sexual Activity  . Alcohol use: No  . Drug use: No  . Sexual activity: Not on file  Lifestyle  . Physical activity    Days per week: Not on file    Minutes per session: Not on file  . Stress: Not on file  Relationships  . Social Musicianconnections    Talks on phone: Not on file    Gets together: Not on file    Attends religious service: Not on file    Active member of club or organization: Not on file    Attends meetings of clubs or organizations: Not on file    Relationship status: Not on file  . Intimate partner violence    Fear of current or ex partner: Not on file    Emotionally abused: Not on file    Physically abused: Not on file    Forced sexual activity: Not on file  Other Topics Concern  . Not on file  Social History Narrative   Single   Has twin sister, Programmer, applicationskaley   Senior in high school   Not sexually active   Wants to be physical therapist   The PMH, PSH, Social History, Family History, Medications, and allergies [facility], and have been updated if relevant.  Review of Systems  Constitutional: Negative.   HENT: Negative.   Eyes: Positive for photophobia.  Respiratory: Negative.   Cardiovascular: Negative.   Gastrointestinal: Positive  for abdominal pain, nausea and vomiting. Negative for anal bleeding, blood in stool, constipation, diarrhea and rectal pain.  Endocrine: Negative.   Genitourinary: Negative.   Musculoskeletal: Negative.   Skin: Negative.   Allergic/Immunologic: Negative.   Neurological: Positive for light-headedness and headaches. Negative for dizziness, tremors, seizures, syncope, facial asymmetry, speech difficulty, weakness and numbness.  Hematological: Negative.   Psychiatric/Behavioral: Negative.   All other systems reviewed and are negative.      Objective:    BP 120/90 (BP Location: Left Arm, Patient Position: Sitting, Cuff Size: Normal)   Pulse 68   Temp 99.3 F (37.4 C) (Oral)   Ht 5\' 4"  (1.626 m)   Wt 175 lb 6.4  oz (79.6 kg)   LMP 02/22/2019   SpO2 97%   BMI 30.11 kg/m    Physical Exam Vitals signs and nursing note reviewed.  Constitutional:      General: She is not in acute distress.    Appearance: She is well-developed. She is not ill-appearing or toxic-appearing.  HENT:     Head: Normocephalic and atraumatic.     Mouth/Throat:     Mouth: Mucous membranes are moist.  Eyes:     Extraocular Movements: Extraocular movements intact.  Cardiovascular:     Rate and Rhythm: Normal rate and regular rhythm.  Pulmonary:     Effort: Pulmonary effort is normal.  Abdominal:     General: Bowel sounds are normal.     Palpations: Abdomen is soft. There is no fluid wave.     Tenderness: There is abdominal tenderness in the right upper quadrant and epigastric area. There is no right CVA tenderness, left CVA tenderness, guarding or rebound. Negative signs include Murphy's sign, Rovsing's sign, McBurney's sign, psoas sign and obturator sign.  Genitourinary:    Adnexa: Right adnexa normal and left adnexa normal.     Rectum: Normal.  Skin:    General: Skin is warm and dry.  Neurological:     General: No focal deficit present.     Mental Status: She is alert.  Psychiatric:        Mood and  Affect: Mood normal.        Behavior: Behavior normal.           Assessment & Plan:   Right upper quadrant pain - Plan: US Abdomen Limited RUQ, Ambulatory referral to Gastroenterology, H. pylori antibody, IgG  Dyspepsia - Plan: US Abdomen Limited RUQ, Ambulatory referral to Gastroenterology, H. pylori antibody, IgG, Comprehensive metabolic panel, Lipase  Epigastric pain - Plan: US Abdomen Limited RUQ, Ambulatory referral to Gastroenterology, H. pylori antibody, IgG, Comprehensive metabolic panel, Lipase  Chronic intractable headache, unspecified headache type  Nausea and vomiting, intractability of vomiting not specified, unspecified vomiting type No follow-ups on file.

## 2019-03-21 ENCOUNTER — Ambulatory Visit
Admission: RE | Admit: 2019-03-21 | Discharge: 2019-03-21 | Disposition: A | Discharge status: Home / Self Care | Payer: 59 | Source: Ambulatory Visit | Attending: Family Medicine | Admitting: Family Medicine

## 2019-03-21 ENCOUNTER — Encounter: Payer: Self-pay | Admitting: Gastroenterology

## 2019-03-21 ENCOUNTER — Other Ambulatory Visit: Payer: Self-pay | Admitting: Family Medicine

## 2019-03-21 ENCOUNTER — Other Ambulatory Visit: Payer: Self-pay

## 2019-03-21 ENCOUNTER — Encounter: Payer: Self-pay | Admitting: Family Medicine

## 2019-03-21 DIAGNOSIS — R1013 Epigastric pain: Secondary | ICD-10-CM | POA: Diagnosis not present

## 2019-03-21 DIAGNOSIS — K8020 Calculus of gallbladder without cholecystitis without obstruction: Secondary | ICD-10-CM | POA: Diagnosis not present

## 2019-03-21 DIAGNOSIS — K8000 Calculus of gallbladder with acute cholecystitis without obstruction: Secondary | ICD-10-CM

## 2019-03-21 DIAGNOSIS — R1011 Right upper quadrant pain: Secondary | ICD-10-CM | POA: Insufficient documentation

## 2019-03-22 ENCOUNTER — Other Ambulatory Visit: Payer: Self-pay | Admitting: Family

## 2019-03-22 DIAGNOSIS — D649 Anemia, unspecified: Secondary | ICD-10-CM

## 2019-03-23 ENCOUNTER — Inpatient Hospital Stay: Payer: Managed Care, Other (non HMO)

## 2019-03-23 ENCOUNTER — Encounter: Payer: Self-pay | Admitting: Family

## 2019-03-23 ENCOUNTER — Other Ambulatory Visit: Payer: Self-pay

## 2019-03-23 ENCOUNTER — Inpatient Hospital Stay: Payer: Managed Care, Other (non HMO) | Attending: Family | Admitting: Family

## 2019-03-23 VITALS — BP 113/76 | HR 81 | Resp 18

## 2019-03-23 VITALS — BP 139/96 | HR 84 | Temp 97.5°F | Resp 19 | Ht 63.0 in | Wt 145.8 lb

## 2019-03-23 DIAGNOSIS — Z809 Family history of malignant neoplasm, unspecified: Secondary | ICD-10-CM

## 2019-03-23 DIAGNOSIS — D508 Other iron deficiency anemias: Secondary | ICD-10-CM | POA: Diagnosis not present

## 2019-03-23 DIAGNOSIS — R0789 Other chest pain: Secondary | ICD-10-CM | POA: Diagnosis not present

## 2019-03-23 DIAGNOSIS — R0602 Shortness of breath: Secondary | ICD-10-CM

## 2019-03-23 DIAGNOSIS — R42 Dizziness and giddiness: Secondary | ICD-10-CM | POA: Diagnosis not present

## 2019-03-23 DIAGNOSIS — Z832 Family history of diseases of the blood and blood-forming organs and certain disorders involving the immune mechanism: Secondary | ICD-10-CM

## 2019-03-23 DIAGNOSIS — D649 Anemia, unspecified: Secondary | ICD-10-CM

## 2019-03-23 DIAGNOSIS — Z862 Personal history of diseases of the blood and blood-forming organs and certain disorders involving the immune mechanism: Secondary | ICD-10-CM

## 2019-03-23 DIAGNOSIS — Z803 Family history of malignant neoplasm of breast: Secondary | ICD-10-CM

## 2019-03-23 DIAGNOSIS — R5383 Other fatigue: Secondary | ICD-10-CM

## 2019-03-23 LAB — CMP (CANCER CENTER ONLY)
ALT: 22 U/L (ref 0–44)
AST: 15 U/L (ref 15–41)
Albumin: 4.5 g/dL (ref 3.5–5.0)
Alkaline Phosphatase: 58 U/L (ref 38–126)
Anion gap: 8 (ref 5–15)
BUN: 12 mg/dL (ref 6–20)
CO2: 26 mmol/L (ref 22–32)
Calcium: 9.4 mg/dL (ref 8.9–10.3)
Chloride: 105 mmol/L (ref 98–111)
Creatinine: 0.8 mg/dL (ref 0.44–1.00)
GFR, Est AFR Am: >60 mL/min (ref >60)
GFR, Estimated: >60 mL/min (ref >60)
Glucose, Bld: 92 mg/dL (ref 70–99)
Potassium: 4 mmol/L (ref 3.5–5.1)
Sodium: 139 mmol/L (ref 135–145)
Total Bilirubin: 0.4 mg/dL (ref 0.3–1.2)
Total Protein: 6.8 g/dL (ref 6.5–8.1)

## 2019-03-23 LAB — CBC WITH DIFFERENTIAL (CANCER CENTER ONLY)
Abs Immature Granulocytes: 0.02 10*3/uL (ref 0.00–0.07)
Basophils Absolute: 0 10*3/uL (ref 0.0–0.1)
Basophils Relative: 0 %
Eosinophils Absolute: 0.1 10*3/uL (ref 0.0–0.5)
Eosinophils Relative: 1 %
HCT: 38.1 % (ref 36.0–46.0)
Hemoglobin: 12.3 g/dL (ref 12.0–15.0)
Immature Granulocytes: 0 %
Lymphocytes Relative: 26 %
Lymphs Abs: 2.1 10*3/uL (ref 0.7–4.0)
MCH: 28.3 pg (ref 26.0–34.0)
MCHC: 32.3 g/dL (ref 30.0–36.0)
MCV: 87.6 fL (ref 80.0–100.0)
Monocytes Absolute: 0.4 10*3/uL (ref 0.1–1.0)
Monocytes Relative: 5 %
Neutro Abs: 5.5 10*3/uL (ref 1.7–7.7)
Neutrophils Relative %: 68 %
Platelet Count: 291 10*3/uL (ref 150–400)
RBC: 4.35 MIL/uL (ref 3.87–5.11)
RDW: 13.2 % (ref 11.5–15.5)
WBC Count: 8.1 10*3/uL (ref 4.0–10.5)
nRBC: 0 % (ref 0.0–0.2)

## 2019-03-23 LAB — RETICULOCYTES
Immature Retic Fract: 13.4 % (ref 2.3–15.9)
RBC.: 4.4 MIL/uL (ref 3.87–5.11)
Retic Count, Absolute: 59.8 10*3/uL (ref 19.0–186.0)
Retic Ct Pct: 1.4 % (ref 0.4–3.1)

## 2019-03-23 LAB — SAVE SMEAR(SSMR), FOR PROVIDER SLIDE REVIEW

## 2019-03-23 LAB — LACTATE DEHYDROGENASE: LDH: 142 U/L (ref 98–192)

## 2019-03-23 MED ORDER — SODIUM CHLORIDE 0.9 % IV SOLN
510.0000 mg | Freq: Once | INTRAVENOUS | Status: AC
  Administered 2019-03-23: 10:00:00 510 mg via INTRAVENOUS
  Filled 2019-03-23: qty 17

## 2019-03-23 MED ORDER — SODIUM CHLORIDE 0.9 % IV SOLN
Freq: Once | INTRAVENOUS | Status: AC
  Administered 2019-03-23: 10:00:00 via INTRAVENOUS
  Filled 2019-03-23: qty 250

## 2019-03-23 NOTE — Progress Notes (Signed)
Hematology/Oncology Consultation   Name: [name]      MRN: [id number]    Location: Room/bed info not found  Date: 03/23/2019 Time:9:27 AM   REFERRING PHYSICIAN: Talia M. Deborra Medina, [name]  REASON FOR CONSULT: Iron deficiency anemia    DIAGNOSIS: Iron deficiency anemia   HISTORY OF PRESENT ILLNESS: [name] is a very pleasant 28 yo caucasian female with history of iron deficiency since youth.  Her twin sister, paternal grandmother and paternal aunt are also iron deficient.  Last week her ferritin was 2 and iron saturation 5%. Despite her low iron counts her Hgb has remained stable at 12.3 and MCV 87.  She is symptomatic with fatigue, dizziness, chills, ice cravings as well as mild SOB and chest discomfort with over exertion.  She does not eat red meat and rarely has chicken. Otherwise, she has maintained a good appetite and is staying well hydrated. Her weight is described as stable.  Her cycle is regular and not heavy. No other blood loss noted. No bruising or petechiae.  She has one child and no history of miscarriage. She states that she did require IV iron in 2017 after she gave birth. She does not recall requiring a blood transfusion.  No known thyroid disease or diabetes.  No personal history of cancer. Family history includes: maternal grandmother with breast and paternal grandmother with an unknown primary.  No fever, n/v, cough, rash, palpitations, abdominal pain or changes in bowel or bladder habits.  No swelling, tenderness, numbness or tingling in her extremities.  No falls or syncopal episodes to report.  She does not smoke or use recreational drugs. She will occasionally have an alcoholic beverage socially.  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   ROS: All other 10 point review of systems is negative.   PAST MEDICAL HISTORY:   Past Medical History:  Diagnosis Date  . Anemia   . Skin cancer     ALLERGIES: No  Known Allergies    MEDICATIONS:  Current Outpatient Medications on File Prior to Visit  Medication Sig Dispense Refill  . BLISOVI 24 FE 1-20 MG-MCG(24) tablet TAKE 1 TABLET BY MOUTH EVERY DAY 84 tablet 2  . buPROPion (WELLBUTRIN XL) 150 MG 24 hr tablet TAKE 1 TABLET BY MOUTH EVERY DAY 90 tablet 1  . Cholecalciferol 1.25 MG (50000 UT) TABS 50,000 units PO qwk for 12 weeks. 12 tablet 0  . QUEtiapine (SEROQUEL XR) 50 MG TB24 24 hr tablet TAKE 1 TABLET BY MOUTH EVERYDAY AT BEDTIME 90 tablet 1   No current facility-administered medications on file prior to visit.      PAST SURGICAL HISTORY Past Surgical History:  Procedure Laterality Date  . TONSILLECTOMY     age 34    FAMILY HISTORY: No family history on file.  SOCIAL HISTORY:  reports that she has never smoked. She has never used smokeless tobacco. She reports that she does not drink alcohol or use drugs.  PERFORMANCE STATUS: The patient's performance status is 1 - Symptomatic but completely ambulatory  PHYSICAL EXAM: Most Recent Vital Signs: Blood pressure (!) 139/96, pulse 84, temperature (!) 97.5 F (36.4 C), temperature source Temporal, resp. rate 19, height 5\' 3"  (1.6 m), weight 145 lb 12.8 oz (66.1 kg), SpO2 100 %, unknown if currently breastfeeding. BP (!) 139/96 (BP Location: Left Arm, Patient Position: Sitting)   Pulse 84   Temp (!) 97.5 F (36.4 C) (Temporal)  Resp 19   Ht 5\' 3"  (1.6 m)   Wt 145 lb 12.8 oz (66.1 kg)   SpO2 100%   BMI 25.83 kg/m   General Appearance:    Alert, cooperative, no distress, appears stated age  Head:    Normocephalic, without obvious abnormality, atraumatic  Eyes:    PERRL, conjunctiva/corneas clear, EOM's intact, fundi    benign, both eyes        Throat:   Lips, mucosa, and tongue normal; teeth and gums normal  Neck:   Supple, symmetrical, trachea midline, no adenopathy;    thyroid:  no enlargement/tenderness/nodules; no carotid   bruit or JVD  Back:     Symmetric, no curvature,  ROM normal, no CVA tenderness  Lungs:     Clear to auscultation bilaterally, respirations unlabored  Chest Wall:    No tenderness or deformity   Heart:    Regular rate and rhythm, S1 and S2 normal, no murmur, rub   or gallop     Abdomen:     Soft, non-tender, bowel sounds active all four quadrants,    no masses, no organomegaly        Extremities:   Extremities normal, atraumatic, no cyanosis or edema  Pulses:   2+ and symmetric all extremities  Skin:   Skin color, texture, turgor normal, no rashes or lesions  Lymph nodes:   Cervical, supraclavicular, and axillary nodes normal  Neurologic:   CNII-XII intact, normal strength, sensation and reflexes    throughout    LABORATORY DATA:  Results for orders placed or performed in visit on 03/23/19 (from the past 48 hour(s))  Save Smear (SSMR)     Status: None   Collection Time: 03/23/19  8:42 AM  Result Value Ref Range   Smear Review SMEAR STAINED AND AVAILABLE FOR REVIEW     Comment: Performed at [facility] at [facility], [address], [place], [place] [address]  CBC with Differential (Cancer Center Only)     Status: None   Collection Time: 03/23/19  8:42 AM  Result Value Ref Range   WBC Count 8.1 4.0 - 10.5 K/uL   RBC 4.35 3.87 - 5.11 MIL/uL   Hemoglobin 12.3 12.0 - 15.0 g/dL   HCT 38.1 36.0 - 46.0 %   MCV 87.6 80.0 - 100.0 fL   MCH 28.3 26.0 - 34.0 pg   MCHC 32.3 30.0 - 36.0 g/dL   RDW 13.2 11.5 - 15.5 %   Platelet Count 291 150 - 400 K/uL   nRBC 0.0 0.0 - 0.2 %   Neutrophils Relative % 68 %   Neutro Abs 5.5 1.7 - 7.7 K/uL   Lymphocytes Relative 26 %   Lymphs Abs 2.1 0.7 - 4.0 K/uL   Monocytes Relative 5 %   Monocytes Absolute 0.4 0.1 - 1.0 K/uL   Eosinophils Relative 1 %   Eosinophils Absolute 0.1 0.0 - 0.5 K/uL   Basophils Relative 0 %   Basophils Absolute 0.0 0.0 - 0.1 K/uL   Immature Granulocytes 0 %   Abs Immature Granulocytes 0.02 0.00 - 0.07 K/uL    Comment: Performed at [facility] at [facility], [address], [place], [place] [address]  Reticulocytes     Status: None   Collection Time: 03/23/19  8:43 AM  Result Value Ref Range   Retic Ct Pct 1.4 0.4 - 3.1 %   RBC. 4.40 3.87 - 5.11 MIL/uL  Retic Count, Absolute 59.8 19.0 - 186.0 K/uL   Immature Retic Fract 13.4 2.3 - 15.9 %    Comment: Performed at [facility] at [facility], [address], [place], [place] [address]      RADIOGRAPHY: No results found.     PATHOLOGY: None  ASSESSMENT/PLAN: [name] is a very pleasant 28 yo caucasian female with history of iron deficiency secondary to low dietary intake and regular cycle.  We will proceed with Feraheme infusion today and schedule a second dose for next week.  She will start taking a prenatal vitamin (OTC) daily.  We will plan to see her back in another 8 weeks for follow-up.   All questions were answered and she is in agreement with the plan. She will contact our office with any questions or concerns. We can certainly see her sooner if needed.   She was discussed with and also seen by [name] and he is in agreement with the aforementioned.   Megan Peace, [name]    Addendum:   I saw and evaluated the patient and reviewed [name] Gianmarco Roye's note. I agree with the findings and plan as detailed.  I also reviewed the patient's records in detail, including PCP clinic notes and lab studies.  In summary, patient has had a chronic iron deficiency since her childhood.  She reports symptoms associated with iron deficiency, including fatigue, dizziness, and craving for ice.  Her labs recently were notable for ferritin of 2 and iron saturation of 5%.  Hemoglobin has been relatively stable around 12 despite severe iron deficiency.  She has previously been followed by hematology at [facility], and has been taking oral iron supplement without significant improvement.  Her menstrual cycle is regular, and she  denies any menorrhagia.  She denies any other symptoms of bleeding, such as hematochezia, melena, or hematuria.  She is a vegetarian.  She presents to clinic today for discussion of IV iron.  I personally reviewed the patient's peripheral blood smear today.  The red blood cells were of normal morphology but slightly hypochromic with some scattered central pallor.  There was no schistocytosis.  The white blood cells were of normal morphology. There were no peripheral circulating blasts. The platelets were of normal size and I verified that there were no platelet clumping.  We discussed some of the risks, benefits, and alternatives of intravenous iron infusions.  The patient is symptomatic from iron deficiency and the iron level is critically low; as such, oral supplement is not sufficient to replete iron storage quickly and she will need IV iron to higher levels of iron faster for adequate hematopoiesis.  Some of the side-effects to be expected including risks of infusion reactions, phlebitis, headaches, nausea and fatigue.   The patient is willing to proceed.  We will tentatively schedule the 1st dose today, and repeat the 2nd dose in one week.  Goal is to keep ferritin level greater than 50.  We we will see the patient back in clinic in 2 months to assess her response to IV iron.  Megan Men, [name] 03/23/2019 10:51 AM

## 2019-03-23 NOTE — Patient Instructions (Signed)

## 2019-03-24 ENCOUNTER — Telehealth: Payer: Self-pay

## 2019-03-24 LAB — ERYTHROPOIETIN: Erythropoietin: 15.9 m[IU]/mL (ref 2.6–18.5)

## 2019-03-24 NOTE — Telephone Encounter (Signed)
Called patient to see if her 10/29 iron could be changed to [id number] [facility] or Tues 10/26 or 10/27 afternoon - it is safe to give it at that time interval per Lattie Haw in pharmacy. Unable to leave a message though.  Will see if patient calls back .

## 2019-03-28 ENCOUNTER — Encounter: Payer: Self-pay | Admitting: Surgery

## 2019-03-28 ENCOUNTER — Ambulatory Visit (INDEPENDENT_AMBULATORY_CARE_PROVIDER_SITE_OTHER): Payer: 59 | Admitting: Surgery

## 2019-03-28 ENCOUNTER — Other Ambulatory Visit: Payer: 59

## 2019-03-28 ENCOUNTER — Telehealth: Payer: Self-pay

## 2019-03-28 ENCOUNTER — Other Ambulatory Visit: Payer: Self-pay

## 2019-03-28 ENCOUNTER — Telehealth: Payer: Self-pay | Admitting: Surgery

## 2019-03-28 VITALS — BP 128/89 | HR 71 | Temp 97.9°F | Resp 12 | Ht 63.0 in | Wt 179.8 lb

## 2019-03-28 DIAGNOSIS — K8020 Calculus of gallbladder without cholecystitis without obstruction: Secondary | ICD-10-CM

## 2019-03-28 NOTE — H&P (View-Only) (Signed)
03/28/2019  Reason for Visit:  Symptomatic cholelithiasis  Referring Provider:  Talia Aron, MD  History of Present Illness: Natalie Wu is a 28 y.o. female presenting for evaluation of abdominal pain.  She had been having right upper quadrant and epigastric pain for a few months now.  She describes the pain as starting in the right upper quadrant and moving towards midline and to her right back.  Sometimes the pain moves to the left upper quadrant as well.  This happens after eating, and particularly at night almost every day.  This is also associated with nausea and vomiting.  She is able to eat and keep food down, but fatty or greasy foods and spicy foods cause worse issues.  She also had issues with heartburn and she's been taking Prilosec twice daily with no changes in her pain symptoms.  She denies any fevers, chills, chest pain, shortness of breath.  Her PCP has been working with her with symptoms and she ordered an ultrasound which was done on 10/20.  I have independently viewed the images.  Overall, it does show cholelithiasis, but no wall thickening or pericholecystic fluid.  Denies any abdominal surgeries.  Of note, her mother had a cholecystectomy as well.  Past Medical History: Past Medical History:  Diagnosis Date  . Iron deficiency anemia   . Migraine      Past Surgical History: Past Surgical History:  Procedure Laterality Date  . FOOT SURGERY    . TONSILLECTOMY    . WISDOM TOOTH EXTRACTION      Home Medications: Prior to Admission medications   Medication Sig Start Date End Date Taking? Authorizing Provider  ALPRAZolam (XANAX) 0.5 MG tablet Take 1 tablet (0.5 mg total) by mouth 3 (three) times daily as needed for anxiety. 09/20/17  Yes Aron, Talia M, MD  aspirin-acetaminophen-caffeine (EXCEDRIN MIGRAINE) 250-250-65 MG tablet Take 1 tablet by mouth every 6 (six) hours as needed for headache.   Yes [provider]  Iron-Vitamin C (VITRON-C) 65-125 MG TABS  Take 1 tablet by mouth every morning.   Yes [provider]  Norgestimate-Ethinyl Estradiol Triphasic (TRI-PREVIFEM) 0.18/0.215/0.25 MG-35 MCG tablet Take 1 tablet by mouth daily. 08/18/18  Yes Aron, Talia M, MD  omeprazole (PRILOSEC) 20 MG capsule Take 1 capsule (20 mg total) by mouth 2 (two) times daily before a meal. 02/25/19  Yes Nche, Charlotte Lum, NP  ondansetron (ZOFRAN) 8 MG tablet Take 1 tablet (8 mg total) by mouth every 8 (eight) hours as needed for nausea or vomiting. 03/20/19  Yes Aron, Talia M, MD  SUMAtriptan (IMITREX) 50 MG tablet Take 1 tablet (50 mg total) by mouth every 2 (two) hours as needed for migraine. May repeat in 2 hours if headache persists or recurs. 03/20/19  Yes Aron, Talia M, MD    Allergies: No Known Allergies  Social History:  reports that she has never smoked. She has never used smokeless tobacco. She reports that she does not drink alcohol or use drugs.   Family History: Family History  Problem Relation Age of Onset  . Breast cancer Maternal Uncle   . Bone cancer Maternal Grandmother     Review of Systems: Review of Systems  Constitutional: Negative for chills and fever.  HENT: Negative for hearing loss.   Eyes: Negative for blurred vision.  Respiratory: Negative for shortness of breath.   Cardiovascular: Negative for chest pain.  Gastrointestinal: Positive for abdominal pain, heartburn, nausea and vomiting. Negative for blood in stool, constipation and diarrhea.    Genitourinary: Negative for dysuria.  Musculoskeletal: Negative for myalgias.  Skin: Negative for rash.  Neurological: Negative for dizziness.  Psychiatric/Behavioral: Negative for depression.    Physical Exam BP 128/89   Pulse 71   Temp 97.9 F (36.6 C) (Temporal)   Resp 12   Ht 5\' 3"  (1.6 m)   Wt 179 lb 12.8 oz (81.6 kg)   SpO2 98%   BMI 31.85 kg/m  CONSTITUTIONAL: No acute distress HEENT:  Normocephalic, atraumatic, extraocular motion intact. NECK: Trachea is  midline, and there is no jugular venous distension.  RESPIRATORY:  Lungs are clear, and breath sounds are equal bilaterally. Normal respiratory effort without pathologic use of accessory muscles. CARDIOVASCULAR: Heart is regular without murmurs, gallops, or rubs. GI: The abdomen is soft, non-distended, with tenderness to palpation in the epigastric and right upper quadrant.  Negative Murphy's sign.  Some discomfort in the right back.  MUSCULOSKELETAL:  Normal muscle strength and tone in all four extremities.  No peripheral edema or cyanosis. SKIN: Skin turgor is normal. There are no pathologic skin lesions.  NEUROLOGIC:  Motor and sensation is grossly normal.  Cranial nerves are grossly intact. PSYCH:  Alert and oriented to person, place and time. Affect is normal.  Laboratory Analysis: No results found for this or any previous visit (from the past 24 hour(s)).  Imaging: U/S 03/21/19 FINDINGS: Gallbladder: Gallbladder is contracted. 1.2 cm mobile gallstone. Gallbladder wall is borderline in thickness at 3 mm. Negative sonographic Murphy's.  Common bile duct: Diameter: Normal caliber, 2 mm  Liver: No focal lesion identified. Within normal limits in parenchymal echogenicity. Portal vein is patent on color Doppler imaging with normal direction of blood flow towards the liver.  Other: None.  IMPRESSION: Cholelithiasis.  No sonographic evidence of acute cholecystitis.  Assessment and Plan: This is a 28 y.o. female with symptomatic cholelithiasis.  Discussed with the patient that though some of her symptoms could also be explained by acid reflux or possible gastritis, I do believe that the major component to her symptoms are biliary in etiology.  She has tried PPI and H2 blocker with some improvement of heartburn symptoms but not in her RUQ and epigastric pain.    Discussed with her that we can offer her cholecystectomy.  Discussed the role of laparoscopic cholecystectomy including  risks of bleeding, infection, injury to surrounding structures, possibility of open procedure, and she's willing to proceed.  Discussed post-op activity restrictions, when to return to work.  Tentatively, we can offer her surgery for 11/2 in the afternoon, but she will check with her job and see if she can do it that day and have some time off afterwards.  Otherwise, discussed with her that it is ok to postpone to a date that may be more flexible for her.  Discussed with her also that she would need COVID test prior to surgery and she's in agreement.  She will call us back to confirm date of surgery.  Face-to-face time spent with the patient and care providers was 60 minutes, with more than 50% of the time spent counseling, educating, and coordinating care of the patient.     Melvyn Neth, [facility]

## 2019-03-28 NOTE — Telephone Encounter (Signed)
Patient is calling and is asking about getting the authorization and wanted to know about a code for surgery. Please call patient and advise.

## 2019-03-28 NOTE — Progress Notes (Signed)
03/28/2019  Reason for Visit:  Symptomatic cholelithiasis  Referring Provider:  Ruthe Mannan, MD  History of Present Illness: Natalie Wu is a 28 y.o. female presenting for evaluation of abdominal pain.  She had been having right upper quadrant and epigastric pain for a few months now.  She describes the pain as starting in the right upper quadrant and moving towards midline and to her right back.  Sometimes the pain moves to the left upper quadrant as well.  This happens after eating, and particularly at night almost every day.  This is also associated with nausea and vomiting.  She is able to eat and keep food down, but fatty or greasy foods and spicy foods cause worse issues.  She also had issues with heartburn and she's been taking Prilosec twice daily with no changes in her pain symptoms.  She denies any fevers, chills, chest pain, shortness of breath.  Her PCP has been working with her with symptoms and she ordered an ultrasound which was done on 10/20.  I have independently viewed the images.  Overall, it does show cholelithiasis, but no wall thickening or pericholecystic fluid.  Denies any abdominal surgeries.  Of note, her mother had a cholecystectomy as well.  Past Medical History: Past Medical History:  Diagnosis Date  . Iron deficiency anemia   . Migraine      Past Surgical History: Past Surgical History:  Procedure Laterality Date  . FOOT SURGERY    . TONSILLECTOMY    . WISDOM TOOTH EXTRACTION      Home Medications: Prior to Admission medications   Medication Sig Start Date End Date Taking? Authorizing Provider  ALPRAZolam Prudy Feeler) 0.5 MG tablet Take 1 tablet (0.5 mg total) by mouth 3 (three) times daily as needed for anxiety. 09/20/17  Yes Dianne Dun, MD  aspirin-acetaminophen-caffeine [facility] MIGRAINE) [phone] MG tablet Take 1 tablet by mouth every 6 (six) hours as needed for headache.   Yes [provider]  Iron-Vitamin C (VITRON-C) 65-125 MG TABS  Take 1 tablet by mouth every morning.   Yes [provider]  Norgestimate-Ethinyl Estradiol Triphasic (TRI-PREVIFEM) 0.18/0.215/0.25 MG-35 MCG tablet Take 1 tablet by mouth daily. 08/18/18  Yes Dianne Dun, MD  omeprazole (PRILOSEC) 20 MG capsule Take 1 capsule (20 mg total) by mouth 2 (two) times daily before a meal. 02/25/19  Yes Nche, Bonna Gains, NP  ondansetron (ZOFRAN) 8 MG tablet Take 1 tablet (8 mg total) by mouth every 8 (eight) hours as needed for nausea or vomiting. 03/20/19  Yes Dianne Dun, MD  SUMAtriptan (IMITREX) 50 MG tablet Take 1 tablet (50 mg total) by mouth every 2 (two) hours as needed for migraine. May repeat in 2 hours if headache persists or recurs. 03/20/19  Yes Dianne Dun, MD    Allergies: No Known Allergies  Social History:  reports that she has never smoked. She has never used smokeless tobacco. She reports that she does not drink alcohol or use drugs.   Family History: Family History  Problem Relation Age of Onset  . Breast cancer Maternal Uncle   . Bone cancer Maternal Grandmother     Review of Systems: Review of Systems  Constitutional: Negative for chills and fever.  HENT: Negative for hearing loss.   Eyes: Negative for blurred vision.  Respiratory: Negative for shortness of breath.   Cardiovascular: Negative for chest pain.  Gastrointestinal: Positive for abdominal pain, heartburn, nausea and vomiting. Negative for blood in stool, constipation and diarrhea.  Genitourinary: Negative for dysuria.  Musculoskeletal: Negative for myalgias.  Skin: Negative for rash.  Neurological: Negative for dizziness.  Psychiatric/Behavioral: Negative for depression.    Physical Exam BP 128/89   Pulse 71   Temp 97.9 F (36.6 C) (Temporal)   Resp 12   Ht 5\' 3"  (1.6 m)   Wt 179 lb 12.8 oz (81.6 kg)   SpO2 98%   BMI 31.85 kg/m  CONSTITUTIONAL: No acute distress HEENT:  Normocephalic, atraumatic, extraocular motion intact. NECK: Trachea is  midline, and there is no jugular venous distension.  RESPIRATORY:  Lungs are clear, and breath sounds are equal bilaterally. Normal respiratory effort without pathologic use of accessory muscles. CARDIOVASCULAR: Heart is regular without murmurs, gallops, or rubs. GI: The abdomen is soft, non-distended, with tenderness to palpation in the epigastric and right upper quadrant.  Negative Murphy's sign.  Some discomfort in the right back.  MUSCULOSKELETAL:  Normal muscle strength and tone in all four extremities.  No peripheral edema or cyanosis. SKIN: Skin turgor is normal. There are no pathologic skin lesions.  NEUROLOGIC:  Motor and sensation is grossly normal.  Cranial nerves are grossly intact. PSYCH:  Alert and oriented to person, place and time. Affect is normal.  Laboratory Analysis: No results found for this or any previous visit (from the past 24 hour(s)).  Imaging: U/S 03/21/19 FINDINGS: Gallbladder: Gallbladder is contracted. 1.2 cm mobile gallstone. Gallbladder wall is borderline in thickness at 3 mm. Negative sonographic Murphy's.  Common bile duct: Diameter: Normal caliber, 2 mm  Liver: No focal lesion identified. Within normal limits in parenchymal echogenicity. Portal vein is patent on color Doppler imaging with normal direction of blood flow towards the liver.  Other: None.  IMPRESSION: Cholelithiasis.  No sonographic evidence of acute cholecystitis.  Assessment and Plan: This is a 28 y.o. female with symptomatic cholelithiasis.  Discussed with the patient that though some of her symptoms could also be explained by acid reflux or possible gastritis, I do believe that the major component to her symptoms are biliary in etiology.  She has tried PPI and H2 blocker with some improvement of heartburn symptoms but not in her RUQ and epigastric pain.    Discussed with her that we can offer her cholecystectomy.  Discussed the role of laparoscopic cholecystectomy including  risks of bleeding, infection, injury to surrounding structures, possibility of open procedure, and she's willing to proceed.  Discussed post-op activity restrictions, when to return to work.  Tentatively, we can offer her surgery for 11/2 in the afternoon, but she will check with her job and see if she can do it that day and have some time off afterwards.  Otherwise, discussed with her that it is ok to postpone to a date that may be more flexible for her.  Discussed with her also that she would need COVID test prior to surgery and she's in agreement.  She will call us back to confirm date of surgery.  Face-to-face time spent with the patient and care providers was 60 minutes, with more than 50% of the time spent counseling, educating, and coordinating care of the patient.     Melvyn Neth, [facility]

## 2019-03-28 NOTE — Telephone Encounter (Signed)
Patient called and wanted to move her surgery date from 04/03/19 to 04/05/19. She will get a call from pre admit testing prior to surgery. She will have her Covid testing done on 03/31/19 between 8-10:30 am at the [facility] drive up testing. She is aware to call the day before to get her surgery arrival time.

## 2019-03-28 NOTE — Telephone Encounter (Signed)
Patient called back and wants to keep her original surgery date of 04/03/19. Covid testing on 03/30/19 and will call on 03/31/19 for her arrival time.

## 2019-03-28 NOTE — Patient Instructions (Addendum)
Our surgery scheduler will call you within 24-48 hours to schedule your surgery for 04/03/2019.  Please have the [facility] surgery sheet available with speaking with her.    Gallbladder Eating Plan If you have a gallbladder condition, you may have trouble digesting fats. Eating a low-fat diet can help reduce your symptoms, and may be helpful before and after having surgery to remove your gallbladder (cholecystectomy). Your health care provider may recommend that you work with a diet and nutrition specialist (dietitian) to help you reduce the amount of fat in your diet. What are tips for following this plan? General guidelines  Limit your fat intake to less than 30% of your total daily calories. If you eat around 1,800 calories each day, this is less than 60 grams (g) of fat per day.  Fat is an important part of a healthy diet. Eating a low-fat diet can make it hard to maintain a healthy body weight. Ask your dietitian how much fat, calories, and other nutrients you need each day.  Eat small, frequent meals throughout the day instead of three large meals.  Drink at least 8-10 cups of fluid a day. Drink enough fluid to keep your urine clear or pale yellow.  Limit alcohol intake to no more than 1 drink a day for nonpregnant women and 2 drinks a day for men. One drink equals 12 oz of beer, 5 oz of wine, or 1 oz of hard liquor. Reading food labels  Check Nutrition Facts on food labels for the amount of fat per serving. Choose foods with less than 3 grams of fat per serving. Shopping  Choose nonfat and low-fat healthy foods. Look for the words "nonfat," "low fat," or "fat free."  Avoid buying processed or prepackaged foods. Cooking  Cook using low-fat methods, such as baking, broiling, grilling, or boiling.  Cook with small amounts of healthy fats, such as olive oil, grapeseed oil, canola oil, or sunflower oil. What foods are recommended?   All fresh, frozen, or canned fruits and vegetables.   Whole grains.  Low-fat or non-fat (skim) milk and yogurt.  Lean meat, skinless poultry, fish, eggs, and beans.  Low-fat protein supplement powders or drinks.  Spices and herbs. What foods are not recommended?  High-fat foods. These include baked goods, fast food, fatty cuts of meat, ice cream, french toast, sweet rolls, pizza, cheese bread, foods covered with butter, creamy sauces, or cheese.  Fried foods. These include french fries, tempura, battered fish, breaded chicken, fried breads, and sweets.  Foods with strong odors.  Foods that cause bloating and gas. Summary  A low-fat diet can be helpful if you have a gallbladder condition, or before and after gallbladder surgery.  Limit your fat intake to less than 30% of your total daily calories. This is about 60 g of fat if you eat 1,800 calories each day.  Eat small, frequent meals throughout the day instead of three large meals. This information is not intended to replace advice given to you by your health care provider. Make sure you discuss any questions you have with your health care provider. Document Released: 05/23/2013 Document Revised: 09/08/2018 Document  Laparoscopic Cholecystectomy Laparoscopic cholecystectomy is surgery to remove the gallbladder. The gallbladder is a pear-shaped organ that lies beneath the liver on the right side of the body. The gallbladder stores bile, which is a fluid that helps the body to digest fats. Cholecystectomy is often done for inflammation of the gallbladder (cholecystitis). This condition is usually caused by a  buildup of gallstones (cholelithiasis) in the gallbladder. Gallstones can block the flow of bile, which can result in inflammation and pain. In severe cases, emergency surgery may be required. This procedure is done though small incisions in your abdomen (laparoscopic surgery). A thin scope with a camera (laparoscope) is inserted through one incision. Thin surgical instruments are  inserted through the other incisions. In some cases, a laparoscopic procedure may be turned into a type of surgery that is done through a larger incision (open surgery). Tell a health care provider about:  Any allergies you have.  All medicines you are taking, including vitamins, herbs, eye drops, creams, and over-the-counter medicines.  Any problems you or family members have had with anesthetic medicines.  Any blood disorders you have.  Any surgeries you have had.  Any medical conditions you have.  Whether you are pregnant or may be pregnant. What are the risks? Generally, this is a safe procedure. However, problems may occur, including:  Infection.  Bleeding.  Allergic reactions to medicines.  Damage to other structures or organs.  A stone remaining in the common bile duct. The common bile duct carries bile from the gallbladder into the small intestine.  A bile leak from the cyst duct that is clipped when your gallbladder is removed. What happens before the procedure? Staying hydrated Follow instructions from your health care provider about hydration, which may include:  Up to 2 hours before the procedure - you may continue to drink clear liquids, such as water, clear fruit juice, black coffee, and plain tea. Eating and drinking restrictions Follow instructions from your health care provider about eating and drinking, which may include:  8 hours before the procedure - stop eating heavy meals or foods such as meat, fried foods, or fatty foods.  6 hours before the procedure - stop eating light meals or foods, such as toast or cereal.  6 hours before the procedure - stop drinking milk or drinks that contain milk.  2 hours before the procedure - stop drinking clear liquids. Medicines  Ask your health care provider about: ? Changing or stopping your regular medicines. This is especially important if you are taking diabetes medicines or blood thinners. ? Taking medicines  such as aspirin and ibuprofen. These medicines can thin your blood. Do not take these medicines before your procedure if your health care provider instructs you not to.  You may be given antibiotic medicine to help prevent infection. General instructions  Let your health care provider know if you develop a cold or an infection before surgery.  Plan to have someone take you home from the hospital or clinic.  Ask your health care provider how your surgical site will be marked or identified. What happens during the procedure?   To reduce your risk of infection: ? Your health care team will wash or sanitize their hands. ? Your skin will be washed with soap. ? Hair may be removed from the surgical area.  An IV tube may be inserted into one of your veins.  You will be given one or more of the following: ? A medicine to help you relax (sedative). ? A medicine to make you fall asleep (general anesthetic).  A breathing tube will be placed in your mouth.  Your surgeon will make several small cuts (incisions) in your abdomen.  The laparoscope will be inserted through one of the small incisions. The camera on the laparoscope will send images to a TV screen (monitor) in the operating room.  This lets your surgeon see inside your abdomen.  Air-like gas will be pumped into your abdomen. This will expand your abdomen to give the surgeon more room to perform the surgery.  Other tools that are needed for the procedure will be inserted through the other incisions. The gallbladder will be removed through one of the incisions.  Your common bile duct may be examined. If stones are found in the common bile duct, they may be removed.  After your gallbladder has been removed, the incisions will be closed with stitches (sutures), staples, or skin glue.  Your incisions may be covered with a bandage (dressing). The procedure may vary among health care providers and hospitals. What happens after the  procedure?  Your blood pressure, heart rate, breathing rate, and blood oxygen level will be monitored until the medicines you were given have worn off.  You will be given medicines as needed to control your pain.  Do not drive for 24 hours if you were given a sedative. This information is not intended to replace advice given to you by your health care provider. Make sure you discuss any questions you have with your health care provider. Document Released: 05/18/2005 Document Revised: 04/30/2017 Document Reviewed: 11/04/2015 Elsevier Patient Education  2020 [employer] Reviewed: 06/25/2016 Elsevier Patient Education  2020 [employer].

## 2019-03-28 NOTE — Telephone Encounter (Signed)
Message left for the patient letting her know that we would obtain authorization from her insurance company for her surgery. The CPT code for her surgery is [phone]. She may call back with any further questions.

## 2019-03-29 ENCOUNTER — Other Ambulatory Visit: Payer: Self-pay | Admitting: Family Medicine

## 2019-03-29 ENCOUNTER — Other Ambulatory Visit: Payer: Self-pay

## 2019-03-29 ENCOUNTER — Encounter
Admission: RE | Admit: 2019-03-29 | Discharge: 2019-03-29 | Disposition: A | Discharge status: Home / Self Care | Payer: 59 | Source: Ambulatory Visit | Attending: Surgery | Admitting: Surgery

## 2019-03-29 ENCOUNTER — Other Ambulatory Visit: Admission: RE | Admit: 2019-03-29 | Payer: 59 | Source: Ambulatory Visit

## 2019-03-29 HISTORY — DX: Other complications of anesthesia, initial encounter: T88.59XA

## 2019-03-29 HISTORY — DX: Gastro-esophageal reflux disease without esophagitis: K21.9

## 2019-03-29 NOTE — Patient Instructions (Signed)
Your procedure is scheduled on:04/03/2019 Mon  Report to Same Day Surgery 2nd floor medical mall [facility] Entrance-take elevator on left to 2nd floor.  Check in with surgery information desk.) To find out your arrival time please call [phone] between 1PM - 3PM on 03/21/2019 Fri  Remember: Instructions that are not followed completely may result in serious medical risk, up to and including death, or upon the discretion of your surgeon and anesthesiologist your surgery may need to be rescheduled.    _x___ 1. Do not eat food after midnight the night before your procedure. You may drink clear liquids up to 2 hours before you are scheduled to arrive at the hospital for your procedure.  Do not drink clear liquids within 2 hours of your scheduled arrival to the hospital.  Clear liquids include  --Water or Apple juice without pulp  --Clear carbohydrate beverage such as ClearFast or Gatorade  --Black Coffee or Clear Tea (No milk, no creamers, do not add anything to                  the coffee or Tea Type 1 and type 2 diabetics should only drink water.   ____Ensure clear carbohydrate drink on the way to the hospital for bariatric patients  ____Ensure clear carbohydrate drink 3 hours before surgery.   No gum chewing or hard candies.     __x__ 2. No Alcohol for 24 hours before or after surgery.   __x__3. No Smoking or e-cigarettes for 24 prior to surgery.  Do not use any chewable tobacco products for at least 6 hour prior to surgery   ____  4. Bring all medications with you on the day of surgery if instructed.    __x__ 5. Notify your doctor if there is any change in your medical condition     (cold, fever, infections).    x___6. On the morning of surgery brush your teeth with toothpaste and water.  You may rinse your mouth with mouth wash if you wish.  Do not swallow any toothpaste or mouthwash.   Do not wear jewelry, make-up, hairpins, clips or nail polish.  Do not wear lotions,  powders, or perfumes. You may wear deodorant.  Do not shave 48 hours prior to surgery. Men may shave face and neck.  Do not bring valuables to the hospital.    [facility] is not responsible for any belongings or valuables.               Contacts, dentures or bridgework may not be worn into surgery.  Leave your suitcase in the car. After surgery it may be brought to your room.  For patients admitted to the hospital, discharge time is determined by your                       treatment team.  _  Patients discharged the day of surgery will not be allowed to drive home.  You will need someone to drive you home and stay with you the night of your procedure.    Please read over the following fact sheets that you were given:   [facility] Preparing for Surgery and or MRSA Information   _x___ Take anti-hypertensive listed below, cardiac, seizure, asthma,     anti-reflux and psychiatric medicines. These include:  1. HYDROcodone-acetaminophen (NORCO) 5-325 MG tablet  2.omeprazole (PRILOSEC) 20 MG capsule  3.  4.  5.  6.  ____Fleets enema or Magnesium Citrate as  directed.   _x___ Use CHG Soap or sage wipes as directed on instruction sheet   ____ Use inhalers on the day of surgery and bring to hospital day of surgery  ____ Stop Metformin and Janumet 2 days prior to surgery.    ____ Take 1/2 of usual insulin dose the night before surgery and none on the morning     surgery.   _x___ Follow recommendations from Cardiologist, Pulmonologist or PCP regarding          stopping Aspirin, Coumadin, Plavix ,Eliquis, Effient, or Pradaxa, and Pletal.  X____Stop Anti-inflammatories such as Advil, Aleve, Ibuprofen, Motrin, Naproxen, Naprosyn, Goodies powders or aspirin products. OK to take Tylenol and                          Celebrex.   _x___ Stop supplements until after surgery.  But may continue Vitamin D, Vitamin B,       and multivitamin.   ____ Bring C-Pap to the hospital.

## 2019-03-30 ENCOUNTER — Inpatient Hospital Stay: Payer: Managed Care, Other (non HMO)

## 2019-03-30 ENCOUNTER — Other Ambulatory Visit: Payer: Self-pay

## 2019-03-30 ENCOUNTER — Inpatient Hospital Stay: Admission: RE | Admit: 2019-03-30 | Payer: 59 | Source: Ambulatory Visit

## 2019-03-30 ENCOUNTER — Other Ambulatory Visit
Admission: RE | Admit: 2019-03-30 | Discharge: 2019-03-30 | Disposition: A | Discharge status: Home / Self Care | Payer: 59 | Source: Ambulatory Visit | Attending: Surgery | Admitting: Surgery

## 2019-03-30 ENCOUNTER — Other Ambulatory Visit: Payer: 59

## 2019-03-30 VITALS — BP 109/67 | HR 84 | Temp 97.5°F | Resp 16

## 2019-03-30 DIAGNOSIS — Z20828 Contact with and (suspected) exposure to other viral communicable diseases: Secondary | ICD-10-CM | POA: Insufficient documentation

## 2019-03-30 DIAGNOSIS — Z01812 Encounter for preprocedural laboratory examination: Secondary | ICD-10-CM | POA: Insufficient documentation

## 2019-03-30 DIAGNOSIS — Z862 Personal history of diseases of the blood and blood-forming organs and certain disorders involving the immune mechanism: Secondary | ICD-10-CM

## 2019-03-30 DIAGNOSIS — D508 Other iron deficiency anemias: Secondary | ICD-10-CM | POA: Diagnosis not present

## 2019-03-30 LAB — SARS CORONAVIRUS 2 (TAT 6-24 HRS): SARS Coronavirus 2: NEGATIVE

## 2019-03-30 MED ORDER — SODIUM CHLORIDE 0.9 % IV SOLN
Freq: Once | INTRAVENOUS | Status: AC
  Administered 2019-03-30: 14:00:00 via INTRAVENOUS
  Filled 2019-03-30: qty 250

## 2019-03-30 MED ORDER — SODIUM CHLORIDE 0.9 % IV SOLN
510.0000 mg | Freq: Once | INTRAVENOUS | Status: AC
  Administered 2019-03-30: 510 mg via INTRAVENOUS
  Filled 2019-03-30: qty 17

## 2019-03-30 NOTE — Patient Instructions (Signed)

## 2019-04-03 ENCOUNTER — Telehealth: Payer: Self-pay | Admitting: *Deleted

## 2019-04-03 ENCOUNTER — Other Ambulatory Visit: Payer: Self-pay

## 2019-04-03 ENCOUNTER — Encounter: Payer: Self-pay | Admitting: Family Medicine

## 2019-04-03 ENCOUNTER — Encounter
Admission: RE | Disposition: A | Discharge status: Home / Self Care | Payer: Self-pay | Source: Home / Self Care | Attending: Surgery

## 2019-04-03 ENCOUNTER — Ambulatory Visit
Admission: RE | Admit: 2019-04-03 | Discharge: 2019-04-03 | Disposition: A | Discharge status: Home / Self Care | Payer: 59 | Attending: Surgery | Admitting: Surgery

## 2019-04-03 ENCOUNTER — Ambulatory Visit: Payer: 59 | Admitting: Anesthesiology

## 2019-04-03 DIAGNOSIS — K219 Gastro-esophageal reflux disease without esophagitis: Secondary | ICD-10-CM | POA: Insufficient documentation

## 2019-04-03 DIAGNOSIS — K8020 Calculus of gallbladder without cholecystitis without obstruction: Secondary | ICD-10-CM

## 2019-04-03 DIAGNOSIS — Z7982 Long term (current) use of aspirin: Secondary | ICD-10-CM | POA: Diagnosis not present

## 2019-04-03 DIAGNOSIS — K8010 Calculus of gallbladder with chronic cholecystitis without obstruction: Secondary | ICD-10-CM | POA: Insufficient documentation

## 2019-04-03 DIAGNOSIS — D509 Iron deficiency anemia, unspecified: Secondary | ICD-10-CM | POA: Insufficient documentation

## 2019-04-03 DIAGNOSIS — Z79899 Other long term (current) drug therapy: Secondary | ICD-10-CM | POA: Diagnosis not present

## 2019-04-03 DIAGNOSIS — G43909 Migraine, unspecified, not intractable, without status migrainosus: Secondary | ICD-10-CM | POA: Diagnosis not present

## 2019-04-03 DIAGNOSIS — F419 Anxiety disorder, unspecified: Secondary | ICD-10-CM | POA: Insufficient documentation

## 2019-04-03 DIAGNOSIS — Z793 Long term (current) use of hormonal contraceptives: Secondary | ICD-10-CM | POA: Diagnosis not present

## 2019-04-03 HISTORY — PX: CHOLECYSTECTOMY: SHX55

## 2019-04-03 LAB — POCT PREGNANCY, URINE: Preg Test, Ur: NEGATIVE

## 2019-04-03 SURGERY — LAPAROSCOPIC CHOLECYSTECTOMY
Anesthesia: General

## 2019-04-03 MED ORDER — OXYCODONE HCL 5 MG/5ML PO SOLN: 5.0000 mg | Freq: Once | ORAL | Status: AC | PRN

## 2019-04-03 MED ORDER — FENTANYL CITRATE (PF) 100 MCG/2ML IJ SOLN
INTRAMUSCULAR | Status: AC
  Filled 2019-04-03: qty 2

## 2019-04-03 MED ORDER — ONDANSETRON HCL 4 MG/2ML IJ SOLN
INTRAMUSCULAR | Status: AC
  Filled 2019-04-03: qty 2

## 2019-04-03 MED ORDER — PROPOFOL 10 MG/ML IV BOLUS
INTRAVENOUS | Status: AC
  Filled 2019-04-03: qty 20

## 2019-04-03 MED ORDER — CEFAZOLIN SODIUM-DEXTROSE 2-4 GM/100ML-% IV SOLN: 2.0000 g | INTRAVENOUS | Status: DC

## 2019-04-03 MED ORDER — DEXAMETHASONE SODIUM PHOSPHATE 10 MG/ML IJ SOLN
INTRAMUSCULAR | Status: AC
  Filled 2019-04-03: qty 1

## 2019-04-03 MED ORDER — OXYCODONE HCL 5 MG PO TABS
5.0000 mg | ORAL_TABLET | Freq: Once | ORAL | Status: AC | PRN
  Administered 2019-04-03: 15:00:00 5 mg via ORAL

## 2019-04-03 MED ORDER — CHLORHEXIDINE GLUCONATE CLOTH 2 % EX PADS: 6.0000 | MEDICATED_PAD | Freq: Once | CUTANEOUS | Status: DC

## 2019-04-03 MED ORDER — OXYCODONE HCL 5 MG PO TABS: 5.0000 mg | ORAL_TABLET | ORAL | 0 refills | Status: DC | PRN

## 2019-04-03 MED ORDER — FAMOTIDINE 20 MG PO TABS
ORAL_TABLET | ORAL | Status: AC
  Administered 2019-04-03: 20 mg via ORAL
  Filled 2019-04-03: qty 1

## 2019-04-03 MED ORDER — SUGAMMADEX SODIUM 200 MG/2ML IV SOLN
INTRAVENOUS | Status: DC | PRN
  Administered 2019-04-03: 160 mg via INTRAVENOUS

## 2019-04-03 MED ORDER — IBUPROFEN 600 MG PO TABS: 600.0000 mg | ORAL_TABLET | Freq: Three times a day (TID) | ORAL | 0 refills | Status: DC | PRN

## 2019-04-03 MED ORDER — FENTANYL CITRATE (PF) 100 MCG/2ML IJ SOLN
25.0000 ug | INTRAMUSCULAR | Status: DC | PRN
  Administered 2019-04-03 (×2): 25 ug via INTRAVENOUS

## 2019-04-03 MED ORDER — LIDOCAINE HCL (CARDIAC) PF 100 MG/5ML IV SOSY
PREFILLED_SYRINGE | INTRAVENOUS | Status: DC | PRN
  Administered 2019-04-03: 50 mg via INTRAVENOUS

## 2019-04-03 MED ORDER — ACETAMINOPHEN 500 MG PO TABS
1000.0000 mg | ORAL_TABLET | ORAL | Status: AC
  Administered 2019-04-03: 1000 mg via ORAL

## 2019-04-03 MED ORDER — ROCURONIUM BROMIDE 50 MG/5ML IV SOLN
INTRAVENOUS | Status: AC
  Filled 2019-04-03: qty 1

## 2019-04-03 MED ORDER — DEXMEDETOMIDINE HCL 200 MCG/2ML IV SOLN
INTRAVENOUS | Status: DC | PRN
  Administered 2019-04-03: 12 ug via INTRAVENOUS
  Administered 2019-04-03: 8 ug via INTRAVENOUS

## 2019-04-03 MED ORDER — FAMOTIDINE 20 MG PO TABS
20.0000 mg | ORAL_TABLET | Freq: Once | ORAL | Status: AC
  Administered 2019-04-03: 12:00:00 20 mg via ORAL

## 2019-04-03 MED ORDER — GABAPENTIN 300 MG PO CAPS
300.0000 mg | ORAL_CAPSULE | ORAL | Status: AC
  Administered 2019-04-03: 300 mg via ORAL

## 2019-04-03 MED ORDER — SUGAMMADEX SODIUM 200 MG/2ML IV SOLN
INTRAVENOUS | Status: AC
  Filled 2019-04-03: qty 2

## 2019-04-03 MED ORDER — MIDAZOLAM HCL 2 MG/2ML IJ SOLN
INTRAMUSCULAR | Status: AC
  Filled 2019-04-03: qty 2

## 2019-04-03 MED ORDER — KETOROLAC TROMETHAMINE 30 MG/ML IJ SOLN
INTRAMUSCULAR | Status: AC
  Filled 2019-04-03: qty 1

## 2019-04-03 MED ORDER — ONDANSETRON 4 MG PO TBDP: 4.0000 mg | ORAL_TABLET | Freq: Three times a day (TID) | ORAL | 0 refills | Status: DC | PRN

## 2019-04-03 MED ORDER — OXYCODONE HCL 5 MG PO TABS
ORAL_TABLET | ORAL | Status: AC
  Administered 2019-04-03: 5 mg via ORAL
  Filled 2019-04-03: qty 1

## 2019-04-03 MED ORDER — MIDAZOLAM HCL 2 MG/2ML IJ SOLN
INTRAMUSCULAR | Status: DC | PRN
  Administered 2019-04-03: 2 mg via INTRAVENOUS

## 2019-04-03 MED ORDER — BUPIVACAINE-EPINEPHRINE (PF) 0.25% -1:200000 IJ SOLN
INTRAMUSCULAR | Status: DC | PRN
  Administered 2019-04-03: 30 mL

## 2019-04-03 MED ORDER — LIDOCAINE HCL (PF) 2 % IJ SOLN
INTRAMUSCULAR | Status: AC
  Filled 2019-04-03: qty 10

## 2019-04-03 MED ORDER — DEXAMETHASONE SODIUM PHOSPHATE 10 MG/ML IJ SOLN
INTRAMUSCULAR | Status: DC | PRN
  Administered 2019-04-03: 10 mg via INTRAVENOUS

## 2019-04-03 MED ORDER — ROCURONIUM BROMIDE 100 MG/10ML IV SOLN
INTRAVENOUS | Status: DC | PRN
  Administered 2019-04-03: 50 mg via INTRAVENOUS

## 2019-04-03 MED ORDER — PROPOFOL 10 MG/ML IV BOLUS
INTRAVENOUS | Status: DC | PRN
  Administered 2019-04-03: 150 mg via INTRAVENOUS

## 2019-04-03 MED ORDER — ONDANSETRON HCL 4 MG/2ML IJ SOLN
4.0000 mg | Freq: Once | INTRAMUSCULAR | Status: AC
  Administered 2019-04-03: 4 mg via INTRAVENOUS

## 2019-04-03 MED ORDER — FENTANYL CITRATE (PF) 100 MCG/2ML IJ SOLN
INTRAMUSCULAR | Status: DC | PRN
  Administered 2019-04-03 (×2): 100 ug via INTRAVENOUS

## 2019-04-03 MED ORDER — ONDANSETRON HCL 4 MG/2ML IJ SOLN
INTRAMUSCULAR | Status: DC | PRN
  Administered 2019-04-03: 4 mg via INTRAVENOUS

## 2019-04-03 MED ORDER — FENTANYL CITRATE (PF) 100 MCG/2ML IJ SOLN
INTRAMUSCULAR | Status: AC
  Administered 2019-04-03: 25 ug via INTRAVENOUS
  Filled 2019-04-03: qty 2

## 2019-04-03 MED ORDER — LACTATED RINGERS IV SOLN
INTRAVENOUS | Status: DC
  Administered 2019-04-03: 13:00:00 via INTRAVENOUS

## 2019-04-03 SURGICAL SUPPLY — 42 items
APPLIER CLIP 5 13 M/L LIGAMAX5 (MISCELLANEOUS) ×2
BLADE SURG 15 STRL LF DISP TIS (BLADE) ×2 IMPLANT
BLADE SURG 15 STRL SS (BLADE) ×2
CANISTER SUCT 1200ML W/VALVE (MISCELLANEOUS) ×2 IMPLANT
CATH CHOLANGI 4FR 420404F (CATHETERS) IMPLANT
CHLORAPREP W/TINT 26 (MISCELLANEOUS) ×2 IMPLANT
CLIP APPLIE 5 13 M/L LIGAMAX5 (MISCELLANEOUS) ×1 IMPLANT
COVER WAND RF STERILE (DRAPES) ×2 IMPLANT
DERMABOND ADVANCED (GAUZE/BANDAGES/DRESSINGS) ×1
DERMABOND ADVANCED .7 DNX12 (GAUZE/BANDAGES/DRESSINGS) ×1 IMPLANT
ELECT CAUTERY BLADE TIP 2.5 (TIP) ×2
ELECT REM PT RETURN 9FT ADLT (ELECTROSURGICAL) ×2
ELECTRODE CAUTERY BLDE TIP 2.5 (TIP) ×1 IMPLANT
ELECTRODE REM PT RTRN 9FT ADLT (ELECTROSURGICAL) ×1 IMPLANT
GLOVE SURG SYN 7.0 (GLOVE) ×2 IMPLANT
GLOVE SURG SYN 7.5  E (GLOVE) ×1
GLOVE SURG SYN 7.5 E (GLOVE) ×1 IMPLANT
GOWN STRL REUS W/ TWL LRG LVL3 (GOWN DISPOSABLE) ×4 IMPLANT
GOWN STRL REUS W/TWL LRG LVL3 (GOWN DISPOSABLE) ×4
IRRIGATION STRYKERFLOW (MISCELLANEOUS) IMPLANT
IRRIGATOR STRYKERFLOW (MISCELLANEOUS)
IV CATH ANGIO 12GX3 LT BLUE (NEEDLE) IMPLANT
IV NS 1000ML (IV SOLUTION)
IV NS 1000ML BAXH (IV SOLUTION) IMPLANT
JACKSON PRATT 10 (INSTRUMENTS) IMPLANT
L-HOOK LAP DISP 36CM (ELECTROSURGICAL) ×2
LABEL OR SOLS (LABEL) ×2 IMPLANT
LHOOK LAP DISP 36CM (ELECTROSURGICAL) ×1 IMPLANT
NEEDLE HYPO 22GX1.5 SAFETY (NEEDLE) ×4 IMPLANT
PACK LAP CHOLECYSTECTOMY (MISCELLANEOUS) ×2 IMPLANT
PENCIL ELECTRO HAND CTR (MISCELLANEOUS) ×2 IMPLANT
POUCH SPECIMEN RETRIEVAL 10MM (ENDOMECHANICALS) ×2 IMPLANT
SCISSORS METZENBAUM CVD 33 (INSTRUMENTS) ×2 IMPLANT
SET TUBE SMOKE EVAC HIGH FLOW (TUBING) ×2 IMPLANT
SLEEVE ADV FIXATION 5X100MM (TROCAR) ×6 IMPLANT
SPONGE VERSALON 4X4 4PLY (MISCELLANEOUS) IMPLANT
SUT MNCRL 4-0 (SUTURE) ×1
SUT MNCRL 4-0 27XMFL (SUTURE) ×1
SUT VICRYL 0 AB UR-6 (SUTURE) ×2 IMPLANT
SUTURE MNCRL 4-0 27XMF (SUTURE) ×1 IMPLANT
TROCAR BALLN GELPORT 12X130M (ENDOMECHANICALS) ×2 IMPLANT
TROCAR Z-THREAD OPTICAL 5X100M (TROCAR) ×2 IMPLANT

## 2019-04-03 NOTE — Telephone Encounter (Signed)
Recived FMLA from [facility] and faxed to [phone] on 04/03/19

## 2019-04-03 NOTE — Anesthesia Procedure Notes (Signed)
Procedure Name: Intubation Date/Time: 04/03/2019 1:04 PM Performed by: Jonna Clark, CRNA Pre-anesthesia Checklist: Patient identified, Patient being monitored, Timeout performed, Emergency Drugs available and Suction available Patient Re-evaluated:Patient Re-evaluated prior to induction Oxygen Delivery Method: Circle system utilized Preoxygenation: Pre-oxygenation with 100% oxygen Induction Type: IV induction Ventilation: Mask ventilation without difficulty Laryngoscope Size: Mac and 3 Grade View: Grade I Tube type: Oral Tube size: 7.0 mm Number of attempts: 1 Airway Equipment and Method: Stylet Placement Confirmation: ETT inserted through vocal cords under direct vision,  positive ETCO2 and breath sounds checked- equal and bilateral Secured at: 21 cm Tube secured with: Tape Dental Injury: Teeth and Oropharynx as per pre-operative assessment

## 2019-04-03 NOTE — Anesthesia Post-op Follow-up Note (Signed)
Anesthesia QCDR form completed.        

## 2019-04-03 NOTE — Transfer of Care (Signed)
Immediate Anesthesia Transfer of Care Note  Patient: Natalie Wu  Procedure(s) Performed: LAPAROSCOPIC CHOLECYSTECTOMY (N/A )  Patient Location: PACU  Anesthesia Type:General  Level of Consciousness: drowsy and patient cooperative  Airway & Oxygen Therapy: Patient Spontanous Breathing and Patient connected to face mask oxygen  Post-op Assessment: Report given to RN and Post -op Vital signs reviewed and stable  Post vital signs: Reviewed and stable  Last Vitals:  Vitals Value Taken Time  BP 113/77 04/03/19 1435  Temp 36.9 C 04/03/19 1435  Pulse 84 04/03/19 1440  Resp 16 04/03/19 1435  SpO2 100 % 04/03/19 1440  Vitals shown include unvalidated device data.  Last Pain:  Vitals:   04/03/19 1435  TempSrc:   PainSc: Asleep         Complications: No apparent anesthesia complications

## 2019-04-03 NOTE — Interval H&P Note (Signed)
History and Physical Interval Note:  04/03/2019 12:33 PM  Natalie Wu  has presented today for surgery, with the diagnosis of symptomatic cholelithiasis.  The various methods of treatment have been discussed with the patient and family. After consideration of risks, benefits and other options for treatment, the patient has consented to  Procedure(s): LAPAROSCOPIC CHOLECYSTECTOMY (N/A) as a surgical intervention.  The patient's history has been reviewed, patient examined, no change in status, stable for surgery.  I have reviewed the patient's chart and labs.  Questions were answered to the patient's satisfaction.     Zebulen Simonis

## 2019-04-03 NOTE — Op Note (Signed)
  Procedure Date:  04/03/2019  Pre-operative Diagnosis:   Symptomatic cholelithiasis  Post-operative Diagnosis:  Symptomatic cholelithiasis  Procedure:  Laparoscopic cholecystectomy  Surgeon:  Melvyn Neth, MD  Assistant:  Stephens November, PA-S  Anesthesia:  General endotracheal  Estimated Blood Loss:  10 ml  Specimens:  gallbladder  Complications:  None  Indications for Procedure:  This is a 28 y.o. female who presents with abdominal pain and workup revealing symptomatic cholelithiasis.  The benefits, complications, treatment options, and expected outcomes were discussed with the patient. The risks of bleeding, infection, recurrence of symptoms, failure to resolve symptoms, bile duct damage, bile duct leak, retained common bile duct stone, bowel injury, and need for further procedures were all discussed with the patient and she was willing to proceed.  Description of Procedure: The patient was correctly identified in the preoperative area and brought into the operating room.  The patient was placed supine with VTE prophylaxis in place.  Appropriate time-outs were performed.  Anesthesia was induced and the patient was intubated.  Appropriate antibiotics were infused.  The abdomen was prepped and draped in a sterile fashion. An infraumbilical incision was made. A cutdown technique was used to enter the abdominal cavity without injury, and a Hasson trocar was inserted.  Pneumoperitoneum was obtained with appropriate opening pressures.  A 5-mm port was placed in the subxiphoid area and two 5-mm ports were placed in the right upper quadrant under direct visualization.  The gallbladder was identified.  The fundus was grasped and retracted cephalad.  Adhesions were lysed bluntly and with electrocautery. The infundibulum was grasped and retracted laterally, exposing the peritoneum overlying the gallbladder.  This was incised with electrocautery and extended on either side of the gallbladder.   The cystic duct and cystic artery were clearly identified and bluntly dissected.  Both were clipped twice proximally and once distally, cutting in between.  The gallbladder was taken from the gallbladder fossa in a retrograde fashion with electrocautery. The gallbladder was placed in an Endocatch bag. The liver bed was inspected and any bleeding was controlled with electrocautery. The right upper quadrant was then inspected again revealing intact clips, no bleeding, and no ductal injury.  The 5 mm ports were removed under direct visualization and the Hasson trocar was removed.  The Endocatch bag was brought out via the umbilical incision. The fascial opening was closed using 0 vicryl suture.  Local anesthetic was infused in all incisions and the incisions were closed with 4-0 Monocryl.  The wounds were cleaned and sealed with DermaBond.  The patient was emerged from anesthesia and extubated and brought to the recovery room for further management.  The patient tolerated the procedure well and all counts were correct at the end of the case.   Melvyn Neth, MD

## 2019-04-03 NOTE — Discharge Instructions (Signed)

## 2019-04-03 NOTE — Anesthesia Preprocedure Evaluation (Signed)
Anesthesia Evaluation  Patient identified by MRN, date of birth, ID band Patient awake    Reviewed: Allergy & Precautions, H&P , NPO status , Patient's Chart, lab work & pertinent test results  History of Anesthesia Complications (+) PROLONGED EMERGENCE and history of anesthetic complications  Airway Mallampati: II  TM Distance: >3 FB Neck ROM: full    Dental  (+) Chipped   Pulmonary neg pulmonary ROS, neg shortness of breath,           Cardiovascular Exercise Tolerance: Good (-) angina(-) Past MI and (-) DOE negative cardio ROS       Neuro/Psych  Headaches, PSYCHIATRIC DISORDERS    GI/Hepatic Neg liver ROS, GERD  Medicated and Controlled,  Endo/Other  negative endocrine ROS  Renal/GU      Musculoskeletal   Abdominal   Peds  Hematology negative hematology ROS (+)   Anesthesia Other Findings Past Medical History: No date: Abdominal pain No date: Complication of anesthesia     Comment:  Slow to wake up No date: GERD (gastroesophageal reflux disease) No date: Iron deficiency anemia No date: Migraine  Past Surgical History: No date: FOOT SURGERY No date: TONSILLECTOMY No date: WISDOM TOOTH EXTRACTION     Reproductive/Obstetrics negative OB ROS                             Anesthesia Physical Anesthesia Plan  ASA: II  Anesthesia Plan: General ETT   Post-op Pain Management:    Induction: Intravenous  PONV Risk Score and Plan: Ondansetron, Dexamethasone, Midazolam and Treatment may vary due to age or medical condition  Airway Management Planned: Oral ETT  Additional Equipment:   Intra-op Plan:   Post-operative Plan: Extubation in OR  Informed Consent: I have reviewed the patients History and Physical, chart, labs and discussed the procedure including the risks, benefits and alternatives for the proposed anesthesia with the patient or authorized representative who has  indicated his/her understanding and acceptance.     Dental Advisory Given  Plan Discussed with: Anesthesiologist, CRNA and Surgeon  Anesthesia Plan Comments: (Patient consented for risks of anesthesia including but not limited to:  - adverse reactions to medications - damage to teeth, lips or other oral mucosa - sore throat or hoarseness - Damage to heart, brain, lungs or loss of life  Patient voiced understanding.)        Anesthesia Quick Evaluation

## 2019-04-04 ENCOUNTER — Encounter: Payer: Self-pay | Admitting: Surgery

## 2019-04-04 NOTE — Anesthesia Postprocedure Evaluation (Signed)
Anesthesia Post Note  Patient: Natalie Wu  Procedure(s) Performed: LAPAROSCOPIC CHOLECYSTECTOMY (N/A )  Patient location during evaluation: PACU Anesthesia Type: General Level of consciousness: awake and alert Pain management: pain level controlled Vital Signs Assessment: post-procedure vital signs reviewed and stable Respiratory status: spontaneous breathing, nonlabored ventilation, respiratory function stable and patient connected to nasal cannula oxygen Cardiovascular status: blood pressure returned to baseline and stable Postop Assessment: no apparent nausea or vomiting Anesthetic complications: no     Last Vitals:  Vitals:   04/03/19 1538 04/03/19 1608  BP: 96/70 117/68  Pulse: 84 97  Resp: 14   Temp: 36.8 C   SpO2: 100% 100%    Last Pain:  Vitals:   04/03/19 1608  TempSrc:   PainSc: 3                  Precious Haws Sachit Gilman

## 2019-04-05 LAB — SURGICAL PATHOLOGY

## 2019-04-10 ENCOUNTER — Telehealth: Payer: Self-pay | Admitting: *Deleted

## 2019-04-10 NOTE — Telephone Encounter (Signed)
Return to work note 04/11/2019 faxed to [facility] [employer] at this time.

## 2019-04-10 NOTE — Telephone Encounter (Signed)
Patient called and wants a back to work note faxed to number [phone] attention Jenny Reichmann and Lattie Haw

## 2019-04-12 NOTE — Telephone Encounter (Signed)
Spoke with patient. Instructed to apply benadryl cream to the rash, if no better in a few days please call office.

## 2019-04-13 ENCOUNTER — Other Ambulatory Visit: Payer: Self-pay

## 2019-04-13 ENCOUNTER — Telehealth: Payer: Self-pay

## 2019-04-13 MED ORDER — AMOXICILLIN-POT CLAVULANATE 875-125 MG PO TABS: 1.0000 | ORAL_TABLET | Freq: Two times a day (BID) | ORAL | 0 refills | Status: AC

## 2019-04-13 NOTE — Telephone Encounter (Signed)
Per Dr.Piscoya Augmentin sent to [facility]. Patient added to schedule 04/14/2019.

## 2019-04-13 NOTE — Telephone Encounter (Signed)
Called [facility]-corrected Augmentin [phone] x 7 days # 14.spoke with Deborra Medina.

## 2019-04-14 ENCOUNTER — Other Ambulatory Visit: Payer: Self-pay

## 2019-04-14 ENCOUNTER — Encounter: Payer: Self-pay | Admitting: Surgery

## 2019-04-14 ENCOUNTER — Ambulatory Visit (INDEPENDENT_AMBULATORY_CARE_PROVIDER_SITE_OTHER): Payer: 59 | Admitting: Surgery

## 2019-04-14 VITALS — BP 128/87 | HR 84 | Temp 97.9°F | Ht 63.0 in | Wt 175.0 lb

## 2019-04-14 DIAGNOSIS — K8020 Calculus of gallbladder without cholecystitis without obstruction: Secondary | ICD-10-CM

## 2019-04-14 MED ORDER — SUMATRIPTAN SUCCINATE 50 MG PO TABS: 50.0000 mg | ORAL_TABLET | ORAL | 0 refills | Status: DC | PRN

## 2019-04-14 NOTE — Progress Notes (Signed)
04/14/2019  HPI: Natalie Wu is a 28 y.o. female s/p laparoscopic cholecystectomy on 04/03/19 for symptomatic cholelithiasis.  She presents today because she has been developing a rash over her incisions this week.  She sent a picture to our office both Wednesday and Thursday and the rash seemed to be progressing, so we scheduled an appointment today.  She denies any worsening pain over the incisions and denies any drainage from the umbilical incision.  She had some mild serous drainage from the right lateral incisions but no pain or purulent fluid.  Denies any fevers or chills.  As a precaution, she was started on Augmentin course for 7 days yesterday.  Vital signs: BP 128/87   Pulse 84   Temp 97.9 F (36.6 C)   Ht 5\' 3"  (1.6 m)   Wt 175 lb (79.4 kg)   LMP  (LMP Unknown)   SpO2 98%   BMI 31.00 kg/m    Physical Exam: Constitutional: No acute distress Abdomen:  Soft, non-distended, non-tender to palpation.  Incisions are clean, dry, intact and healing well.  The patient has erythema over the subcostal incisions that is the same shape as the DermaBond demarcation.  DermaBond is off now.  There is no swelling or induration of the skin edges and there is no active drainage.  The umbilical incision has a 5 cm area infraumbilical of erythema, non-blanching, and non-tender to palpation.  The umbilical incision itself is healing well, without any induration or drainage.  Assessment/Plan: This is a 28 y.o. female s/p laparoscopic cholecystectomy, now with incisional erythema.  --Discussed with the patient that this does not appear to be cellulitis from wound infections.  I think this represents a type of allergic reaction or contact dermatitis.  Over the subcostal incisions, it would appear that the DermaBond has created this reaction, but the umbilical rash does not follow where DermaBond would have been, but a larger, infraumbilical expanse.   --Discussed with the patient that she should  continue with the Augmentin course as a precaution.  She can apply Benadryl ointment over the rash and areas of itching.  Would not recommend steroid ointments so as to prevent any wound complications. --Patient has follow up appointment on 11/17 for wound check.  Encouraged her to keep the appointment.   Melvyn Neth, [facility]

## 2019-04-14 NOTE — Patient Instructions (Addendum)
Try benadryl ointment to the areas but not on the incision. Continue the antibiotic. Please keep your follow up appointment.

## 2019-04-18 ENCOUNTER — Other Ambulatory Visit: Payer: Self-pay

## 2019-04-18 ENCOUNTER — Ambulatory Visit (INDEPENDENT_AMBULATORY_CARE_PROVIDER_SITE_OTHER): Payer: 59 | Admitting: Physician Assistant

## 2019-04-18 ENCOUNTER — Encounter: Payer: Self-pay | Admitting: Physician Assistant

## 2019-04-18 VITALS — BP 164/85 | HR 51 | Temp 97.9°F | Resp 14 | Ht 63.0 in | Wt 135.0 lb

## 2019-04-18 DIAGNOSIS — K8020 Calculus of gallbladder without cholecystitis without obstruction: Secondary | ICD-10-CM

## 2019-04-18 DIAGNOSIS — Z09 Encounter for follow-up examination after completed treatment for conditions other than malignant neoplasm: Secondary | ICD-10-CM

## 2019-04-18 NOTE — Patient Instructions (Signed)
Please call if you have any questions.

## 2019-04-18 NOTE — Progress Notes (Signed)
[facility] POST-OP OFFICE VISIT  04/18/2019  HPI: Natalie Wu is a 28 y.o. female 15 days s/p laparoscopic cholecystectomy with Dr Hampton Abbot. She was seen in follow up on 11/13 for concerns over a rash at her incisions. Thought to be either allergic reaction or contact dermatitis from Dermabond. She was started on 7 days of Augmentin as a precaution.   Today, he reports the rash has resolved. She is finishing her ABx. No other issues  Vital signs: LMP  (LMP Unknown)    Physical Exam: Constitutional: Well appearing female, NAD Abdomen: Soft, non-tender, non-distended Skin: Laparoscopic incisions are well healed, rtash resolved. No erythema or drainage  Assessment/Plan: This is a 28 y.o. female  15 days s/p laparoscopic cholecystectomy with Dr Hampton Abbot.   - Complete ABx  - Complete post-op care/lifting restrictions  - rtc prn  -- Edison Simon, PA-C [facility] 04/18/2019, 9:48 AM [phone] M-F: 7am - 4pm

## 2019-04-21 ENCOUNTER — Encounter: Payer: Self-pay | Admitting: Family Medicine

## 2019-04-21 ENCOUNTER — Ambulatory Visit (INDEPENDENT_AMBULATORY_CARE_PROVIDER_SITE_OTHER): Payer: Managed Care, Other (non HMO) | Admitting: Family Medicine

## 2019-04-21 ENCOUNTER — Other Ambulatory Visit: Payer: Self-pay

## 2019-04-21 ENCOUNTER — Ambulatory Visit: Payer: 59 | Admitting: Gastroenterology

## 2019-04-21 VITALS — BP 102/80 | HR 117 | Temp 98.4°F | Ht 63.0 in | Wt 148.0 lb

## 2019-04-21 DIAGNOSIS — R399 Unspecified symptoms and signs involving the genitourinary system: Secondary | ICD-10-CM | POA: Diagnosis not present

## 2019-04-21 LAB — POCT URINALYSIS DIPSTICK
Blood, UA: NEGATIVE
Glucose, UA: NEGATIVE
Ketones, UA: NEGATIVE
Protein, UA: NEGATIVE
Spec Grav, UA: 1.01 (ref 1.010–1.025)
Urobilinogen, UA: 2 E.U./dL — AB
pH, UA: 7.5 (ref 5.0–8.0)

## 2019-04-21 MED ORDER — NITROFURANTOIN MONOHYD MACRO 100 MG PO CAPS: 100.0000 mg | ORAL_CAPSULE | Freq: Two times a day (BID) | ORAL | 0 refills | Status: DC

## 2019-04-21 NOTE — Progress Notes (Signed)
[name] is a 28 y.o. female  Chief Complaint  Patient presents with  . Urinary Tract Infection    Pt c/o a possible UTI.  Frequent urination,,back pain and burning during urination x2 days.  Pt has left sample of urine for lab.    HPI: [name] is a 28 y.o. female who complains of 2 day h/o urinary frequency, dysuria, back pain. Pt denies fever, chills, n/v, lower abdominal pain, gross hematuria. Pt denies vaginal discharge, itching, odor, abnormal bleeding.  Took a dose of AZO yesterday with some relief.   Past Medical History:  Diagnosis Date  . Anemia   . Skin cancer     Past Surgical History:  Procedure Laterality Date  . TONSILLECTOMY     age 35    Social History   Socioeconomic History  . Marital status: Single    Spouse name: Not on file  . Number of children: Not on file  . Years of education: Not on file  . Highest education level: Not on file  Occupational History  . Not on file  Social Needs  . Financial resource strain: Not on file  . Food insecurity    Worry: Not on file    Inability: Not on file  . Transportation needs    Medical: Not on file    Non-medical: Not on file  Tobacco Use  . Smoking status: Never Smoker  . Smokeless tobacco: Never Used  Substance and Sexual Activity  . Alcohol use: No    Alcohol/week: 0.0 standard drinks  . Drug use: No  . Sexual activity: Not on file  Lifestyle  . Physical activity    Days per week: Not on file    Minutes per session: Not on file  . Stress: Not on file  Relationships  . Social Herbalist on phone: Not on file    Gets together: Not on file    Attends religious service: Not on file    Active member of club or organization: Not on file    Attends meetings of clubs or organizations: Not on file    Relationship status: Not on file  . Intimate partner violence    Fear of current or ex partner: Not on file    Emotionally abused: Not on file    Physically abused: Not on  file    Forced sexual activity: Not on file  Other Topics Concern  . Not on file  Social History Narrative   [birth year]   84 year old son, Donna Christen     History reviewed. No pertinent family history.   Immunization History  Administered Date(s) Administered  . DTaP 06/18/1992, 08/27/1992, 11/08/1992, 07/29/1993, 01/17/1997  . HPV Quadrivalent 03/20/2008, 07/27/2008, 01/16/2009  . Hepatitis B 06/18/1992, 04/23/1993, 10/20/1993, 08/17/2012  . HiB (PRP-OMP) 06/18/1992, 08/27/1992, 11/08/1992, 07/29/1993  . IPV 06/18/1992, 08/27/1992, 07/29/1993, 09/10/1997  . Influenza,inj,Quad PF,6+ Mos 05/13/2015  . MMR 07/29/1993, 09/10/1997, 03/29/2018  . Meningococcal Conjugate 03/20/2008  . PPD Test 08/17/2012  . Tdap 09/15/2017  . Varicella 03/20/2008    Outpatient Encounter Medications as of 04/21/2019  Medication Sig  . BLISOVI 24 FE 1-20 MG-MCG(24) tablet TAKE 1 TABLET BY MOUTH EVERY DAY  . buPROPion (WELLBUTRIN XL) 150 MG 24 hr tablet TAKE 1 TABLET BY MOUTH EVERY DAY  . Cholecalciferol 1.25 MG (50000 UT) TABS 50,000 units PO qwk for 12 weeks.  Marland Kitchen QUEtiapine (SEROQUEL XR) 50 MG TB24 24 hr tablet TAKE 1 TABLET BY  MOUTH EVERYDAY AT BEDTIME   No facility-administered encounter medications on file as of 04/21/2019.      ROS: Pertinent positives and negatives noted in HPI. Remainder of ROS non-contributory  No Known Allergies  BP 102/80 (BP Location: Left Arm, Patient Position: Sitting, Cuff Size: Normal)   Pulse (!) 117   Temp 98.4 F (36.9 C) (Oral)   Ht '5\' 3"'$  (1.6 m)   Wt 148 lb (67.1 kg)   LMP 04/05/2019   SpO2 97%   BMI 26.22 kg/m   Physical Exam  Constitutional: She is oriented to person, place, and time. She appears well-developed and well-nourished. No distress.  Abdominal: Soft. Bowel sounds are normal. There is no CVA tenderness.  Neurological: She is alert and oriented to person, place, and time.  Psychiatric: She has a normal mood and affect. Her behavior is normal.    Component     Latest Ref Rng & Units 04/21/2019  Color, UA      orange  Clarity, UA      cloudy  Glucose     Negative Negative  Bilirubin, UA      1+  Ketones, UA      negative  Specific Gravity, UA     1.010 - 1.025 1.010  RBC, UA      negative  pH, UA     5.0 - 8.0 7.5  Protein,UA     Negative Negative  Urobilinogen, UA     0.2 or 1.0 E.U./dL 2.0 (A)  Nitrite, UA      postive  Leukocytes,UA     Negative Large (3+) (A)    A/P:  1. UTI symptoms - POCT Urinalysis Dipstick - Urinalysis - Urine Culture - increased water intake, can cont AZO if pt feels it is effective/helpful Rx: - nitrofurantoin, macrocrystal-monohydrate, (MACROBID) 100 MG capsule; Take 1 capsule (100 mg total) by mouth 2 (two) times daily.  Dispense: 7 capsule; Refill: 0 - f/u if symptoms worsen or do not improve in 5-7 days Discussed plan and reviewed medications with patient, including risks, benefits, and potential side effects. Pt expressed understand. All questions answered.   This visit occurred during the SARS-CoV-2 public health emergency.  Safety protocols were in place, including screening questions prior to the visit, additional usage of staff PPE, and extensive cleaning of exam room while observing appropriate contact time as indicated for disinfecting solutions.

## 2019-04-21 NOTE — Addendum Note (Signed)
Addended by: Lynnea Ferrier on: 04/21/2019 03:23 PM   Modules accepted: Orders

## 2019-04-22 LAB — URINALYSIS
Bilirubin Urine: NEGATIVE
Glucose, UA: NEGATIVE
Hgb urine dipstick: NEGATIVE
Ketones, ur: NEGATIVE
Nitrite: POSITIVE — AB
Protein, ur: NEGATIVE
Specific Gravity, Urine: 1.011 (ref 1.001–1.03)
pH: 7.5 (ref 5.0–8.0)

## 2019-04-22 LAB — URINE CULTURE
MICRO NUMBER:: 1124281
SPECIMEN QUALITY:: ADEQUATE

## 2019-05-17 ENCOUNTER — Other Ambulatory Visit: Payer: Self-pay | Admitting: Family

## 2019-05-17 DIAGNOSIS — D508 Other iron deficiency anemias: Secondary | ICD-10-CM

## 2019-05-18 ENCOUNTER — Encounter: Payer: Self-pay | Admitting: Family

## 2019-05-18 ENCOUNTER — Other Ambulatory Visit: Payer: Self-pay

## 2019-05-18 ENCOUNTER — Telehealth: Payer: Self-pay | Admitting: Family

## 2019-05-18 ENCOUNTER — Inpatient Hospital Stay: Payer: Managed Care, Other (non HMO) | Attending: Family | Admitting: Family

## 2019-05-18 ENCOUNTER — Inpatient Hospital Stay: Payer: Managed Care, Other (non HMO)

## 2019-05-18 VITALS — BP 105/74 | HR 82 | Temp 97.4°F | Resp 18 | Ht 63.0 in | Wt 150.1 lb

## 2019-05-18 DIAGNOSIS — D508 Other iron deficiency anemias: Secondary | ICD-10-CM | POA: Insufficient documentation

## 2019-05-18 DIAGNOSIS — D500 Iron deficiency anemia secondary to blood loss (chronic): Secondary | ICD-10-CM | POA: Diagnosis not present

## 2019-05-18 LAB — CBC WITH DIFFERENTIAL (CANCER CENTER ONLY)
Abs Immature Granulocytes: 0.1 10*3/uL — ABNORMAL HIGH (ref 0.00–0.07)
Basophils Absolute: 0 10*3/uL (ref 0.0–0.1)
Basophils Relative: 0 %
Eosinophils Absolute: 0.1 10*3/uL (ref 0.0–0.5)
Eosinophils Relative: 1 %
HCT: 41.5 % (ref 36.0–46.0)
Hemoglobin: 13.6 g/dL (ref 12.0–15.0)
Immature Granulocytes: 1 %
Lymphocytes Relative: 24 %
Lymphs Abs: 2.4 10*3/uL (ref 0.7–4.0)
MCH: 29.6 pg (ref 26.0–34.0)
MCHC: 32.8 g/dL (ref 30.0–36.0)
MCV: 90.2 fL (ref 80.0–100.0)
Monocytes Absolute: 0.5 10*3/uL (ref 0.1–1.0)
Monocytes Relative: 5 %
Neutro Abs: 7 10*3/uL (ref 1.7–7.7)
Neutrophils Relative %: 69 %
Platelet Count: 250 10*3/uL (ref 150–400)
RBC: 4.6 MIL/uL (ref 3.87–5.11)
RDW: 15 % (ref 11.5–15.5)
WBC Count: 10.1 10*3/uL (ref 4.0–10.5)
nRBC: 0 % (ref 0.0–0.2)

## 2019-05-18 LAB — IRON AND TIBC
Iron: 90 ug/dL (ref 41–142)
Saturation Ratios: 25 % (ref 21–57)
TIBC: 364 ug/dL (ref 236–444)
UIBC: 273 ug/dL (ref 120–384)

## 2019-05-18 LAB — FERRITIN: Ferritin: 170 ng/mL (ref 11–307)

## 2019-05-18 LAB — RETICULOCYTES
Immature Retic Fract: 9.4 % (ref 2.3–15.9)
RBC.: 4.54 MIL/uL (ref 3.87–5.11)
Retic Count, Absolute: 68.6 10*3/uL (ref 19.0–186.0)
Retic Ct Pct: 1.5 % (ref 0.4–3.1)

## 2019-05-18 NOTE — Progress Notes (Signed)
Hematology and Oncology Follow Up Visit  KYLEE ERNZEN QP:[id number] [birth year] 28 y.o. 05/18/2019   Principle Diagnosis:  Iron deficiency anemia secondary to diet (no red meat) and cycle   Current Therapy:   IV iron as indicated    Interim History:  Ms. Charton is here today for follow-up. She received 2 doses of IV iron last month and has responded nicely.  Her cycle is regular with normal flow. No other blood loss noted.  No bruising or petechiae.  She has no complaints at this time.  She is staying busy with work and her sweet 27 year old boy.  No fever, chills, n/v, cough, rash, dizziness, SOB, chest pain, palpitations, abdominal pain or changes in bowel or bladder habits.  No swelling, tenderness or tingling in her extremities.  She had one episode of numbness in the left hand that resolved after a few seconds.  No falls or syncope.  She is eating well and staying hydrated. Her weight is stable.   ECOG Performance Status: 1 - Symptomatic but completely ambulatory  Medications:  Allergies as of 05/18/2019   No Known Allergies     Medication List       Accurate as of May 18, 2019  9:42 AM. If you have any questions, ask your nurse or doctor.        Blisovi 24 Fe 1-20 MG-MCG(24) tablet Generic drug: Norethindrone Acetate-Ethinyl Estrad-FE TAKE 1 TABLET BY MOUTH EVERY DAY   buPROPion 150 MG 24 hr tablet Commonly known as: WELLBUTRIN XL TAKE 1 TABLET BY MOUTH EVERY DAY   Cholecalciferol 1.25 MG (50000 UT) Tabs 50,000 units PO qwk for 12 weeks.   nitrofurantoin (macrocrystal-monohydrate) 100 MG capsule Commonly known as: Macrobid Take 1 capsule (100 mg total) by mouth 2 (two) times daily.   QUEtiapine 50 MG Tb24 24 hr tablet Commonly known as: SEROQUEL XR TAKE 1 TABLET BY MOUTH EVERYDAY AT BEDTIME       Allergies: No Known Allergies  Past Medical History, Surgical history, Social history, and Family History were reviewed and updated.  Review of  Systems: All other 10 point review of systems is negative.   Physical Exam:  vitals were not taken for this visit.   Wt Readings from Last 3 Encounters:  04/21/19 148 lb (67.1 kg)  03/23/19 145 lb 12.8 oz (66.1 kg)  03/29/18 147 lb 9.6 oz (67 kg)    Ocular: Sclerae unicteric, pupils equal, round and reactive to light Ear-nose-throat: Oropharynx clear, dentition fair Lymphatic: No cervical or supraclavicular adenopathy Lungs no rales or rhonchi, good excursion bilaterally Heart regular rate and rhythm, no murmur appreciated Abd soft, nontender, positive bowel sounds, no liver or spleen tip palpated on exam, no fluid wave  MSK no focal spinal tenderness, no joint edema Neuro: non-focal, well-oriented, appropriate affect Breasts: Deferred   Lab Results  Component Value Date   WBC 10.1 05/18/2019   HGB 13.6 05/18/2019   HCT 41.5 05/18/2019   MCV 90.2 05/18/2019   PLT 250 05/18/2019   Lab Results  Component Value Date   FERRITIN 2.4 (L) 03/13/2019   FERRITIN 3 (L) 03/13/2019   IRON 26 (L) 03/13/2019   TIBC 542 (H) 03/13/2019   UIBC 348 03/18/2017   IRONPCTSAT 5 (L) 03/13/2019   Lab Results  Component Value Date   RETICCTPCT 1.5 05/18/2019   RBC 4.54 05/18/2019   No results found for: KPAFRELGTCHN, LAMBDASER, KAPLAMBRATIO No results found for: IGGSERUM, IGA, IGMSERUM No results found for: TOTALPROTELP, ALBUMINELP,  Nils Flack, SPEI   Chemistry      Component Value Date/Time   NA 139 03/23/2019 0842   K 4.0 03/23/2019 0842   CL 105 03/23/2019 0842   CO2 26 03/23/2019 0842   BUN 12 03/23/2019 0842   CREATININE 0.80 03/23/2019 0842      Component Value Date/Time   CALCIUM 9.4 03/23/2019 0842   ALKPHOS 58 03/23/2019 0842   AST 15 03/23/2019 0842   ALT 22 03/23/2019 0842   BILITOT 0.4 03/23/2019 0842       Impression and Plan: Ms. Kleinow is a very pleasant 28 yo caucasian female with history of iron deficiency secondary to low  dietary intake and regular cycle.  We will see what her iron studies show and bring her back in for infusion if needed.  We will plan to see her back in another 3 months.  She will contact our office with any questions or concerns. We can certainly see her sooner if needed.   Laverna Peace, NP 12/17/20209:42 AM

## 2019-05-18 NOTE — Telephone Encounter (Signed)
Appointments scheduled patient declined avs/calendar 12/17 los

## 2019-05-29 ENCOUNTER — Other Ambulatory Visit: Payer: Self-pay

## 2019-06-30 ENCOUNTER — Encounter: Payer: Self-pay | Admitting: Family Medicine

## 2019-06-30 MED ORDER — NORGESTIM-ETH ESTRAD TRIPHASIC 0.18/0.215/0.25 MG-35 MCG PO TABS: 1.0000 | ORAL_TABLET | Freq: Every day | ORAL | 3 refills | Status: DC

## 2019-06-30 NOTE — Telephone Encounter (Signed)
Last OV 03/20/19 Last fill 08/18/18  #84/3

## 2019-07-03 ENCOUNTER — Other Ambulatory Visit: Payer: Self-pay

## 2019-07-03 MED ORDER — NORGESTIM-ETH ESTRAD TRIPHASIC 0.18/0.215/0.25 MG-35 MCG PO TABS: 1.0000 | ORAL_TABLET | Freq: Every day | ORAL | 3 refills | Status: DC

## 2019-08-16 ENCOUNTER — Inpatient Hospital Stay: Payer: Managed Care, Other (non HMO) | Admitting: Family

## 2019-08-16 ENCOUNTER — Inpatient Hospital Stay: Payer: Managed Care, Other (non HMO) | Attending: Family

## 2019-09-13 ENCOUNTER — Encounter: Payer: Self-pay | Admitting: Internal Medicine

## 2019-09-13 ENCOUNTER — Other Ambulatory Visit: Payer: Self-pay

## 2019-09-13 ENCOUNTER — Ambulatory Visit: Payer: 59 | Admitting: Internal Medicine

## 2019-09-13 DIAGNOSIS — G8929 Other chronic pain: Secondary | ICD-10-CM | POA: Diagnosis not present

## 2019-09-13 DIAGNOSIS — R519 Headache, unspecified: Secondary | ICD-10-CM

## 2019-09-13 DIAGNOSIS — D500 Iron deficiency anemia secondary to blood loss (chronic): Secondary | ICD-10-CM

## 2019-09-13 DIAGNOSIS — F418 Other specified anxiety disorders: Secondary | ICD-10-CM | POA: Diagnosis not present

## 2019-09-13 NOTE — Assessment & Plan Note (Signed)
Continue Ibuprofen and Promethazine as needed Will monitor

## 2019-09-13 NOTE — Patient Instructions (Signed)
Recurrent Migraine Headache A migraine headache is very bad, throbbing pain that is usually on one side of your head. Recurrent migraines keep coming back (recurring). Talk with your doctor about what things may bring on (trigger) your migraine headaches. Follow these instructions at home: Medicines  Take over-the-counter and prescription medicines only as told by your doctor.  Do not drive or use heavy machinery while taking prescription pain medicine. Lifestyle  Do not use any products that contain nicotine or tobacco, such as cigarettes and e-cigarettes. If you need help quitting, ask your doctor.  Limit alcohol intake to no more than 1 drink a day for nonpregnant women and 2 drinks a day for men. One drink equals 12 oz of beer, 5 oz of wine, or 1 oz of hard liquor.  Get 7-9 hours of sleep each night.  Lessen any stress in your life. Ask your doctor about ways to lower your stress.  Stay at a healthy weight. Talk with your doctor if you need help losing weight.  Get regular exercise. General instructions   Keep a journal to find out if certain things bring on migraine headaches. For example, write down: ? What you eat and drink. ? How much sleep you get. ? Any change to your diet or medicines.  Lie down in a dark, quiet room when you have a migraine.  Try placing a cool towel over your head when you have a migraine.  Keep lights dim if bright lights bother you or make your migraines worse.  Keep all follow-up visits as told by your doctor. This is important. Contact a doctor if:  Medicine does not help your migraines.  Your pain keeps coming back.  You have a fever.  You have weight loss without trying. Get help right away if:  Your migraine becomes really bad and medicine does not help.  You have a stiff neck.  You have trouble seeing.  Your muscles are weak or you lose control of your muscles.  You lose your balance or have trouble walking.  You feel  like you will pass out (faint) or you pass out.  You have really bad symptoms that are different than your first symptoms.  You start having sudden, very bad headaches that last for one second or less, like a thunderclap. Summary  A migraine headache is very bad, throbbing pain that is usually on one side of your head.  Talk with your doctor about what things may bring on (trigger) your migraine headaches.  Take over-the-counter and prescription medicines only as told by your doctor.  Lie down in a dark, quiet room when you have a migraine.  Keep a journal about what you eat and drink, how much sleep you get, and any changes to your medicines. This can help you find out if certain things make you have migraine headaches. This information is not intended to replace advice given to you by your health care provider. Make sure you discuss any questions you have with your health care provider. Document Revised: 05/21/2017 Document Reviewed: 04/10/2016 Elsevier Patient Education  2020 Elsevier Inc.  

## 2019-09-13 NOTE — Assessment & Plan Note (Signed)
Continue Xanax prn

## 2019-09-13 NOTE — Progress Notes (Signed)
HPI  Pt presents to the clinic today to establish care and for management of the conditions listed below. She is transferring care from Dr. Dayton Martes.  Anxiety: Situational.  She takes Xanax rarely. She is not seeing a therapist. She denies depression, SI/HI.  Iron Deficiency Anemia: Her last H/H was 13.1/40.5, 11/2018. She is taking Iron/Vit C OTC. She is on OCP's as well.  Frequent Headaches: These occur 1-2 times per week. She takes Ibuprofen and Promethazine as needed with good relief. She does not follow with neurology.  Flu: 03/2019 Tetanus: 10/2014 Pap Smear: 07/2018, ASCUS Dentist: biannually.  Past Medical History:  Diagnosis Date  . Abdominal pain   . Complication of anesthesia    Slow to wake up  . GERD (gastroesophageal reflux disease)   . Iron deficiency anemia   . Migraine     Current Outpatient Medications  Medication Sig Dispense Refill  . ALPRAZolam (XANAX) 0.5 MG tablet Take 1 tablet (0.5 mg total) by mouth 3 (three) times daily as needed for anxiety. 20 tablet 0  . aspirin-acetaminophen-caffeine (EXCEDRIN MIGRAINE) 250-250-65 MG tablet Take 1 tablet by mouth every 6 (six) hours as needed for headache.    . ibuprofen (ADVIL) 600 MG tablet Take 1 tablet (600 mg total) by mouth every 8 (eight) hours as needed for mild pain or moderate pain. 30 tablet 0  . Iron-Vitamin C (VITRON-C) 65-125 MG TABS Take 1 tablet by mouth every morning.    . Norgestimate-Ethinyl Estradiol Triphasic (TRI-PREVIFEM) 0.18/0.215/0.25 MG-35 MCG tablet Take 1 tablet by mouth daily. 84 tablet 3  . omeprazole (PRILOSEC) 20 MG capsule Take 1 capsule (20 mg total) by mouth 2 (two) times daily before a meal. 60 capsule 1  . ondansetron (ZOFRAN ODT) 4 MG disintegrating tablet Take 1 tablet (4 mg total) by mouth every 8 (eight) hours as needed for nausea or vomiting. 20 tablet 0  . oxyCODONE (OXY IR/ROXICODONE) 5 MG immediate release tablet Take 1 tablet (5 mg total) by mouth every 4 (four) hours as needed  for severe pain. 30 tablet 0  . SUMAtriptan (IMITREX) 50 MG tablet Take 1 tablet (50 mg total) by mouth every 2 (two) hours as needed for migraine. May repeat in 2 hours if headache persists or recurs. 10 tablet 0   No current facility-administered medications for this visit.    No Known Allergies  Family History  Problem Relation Age of Onset  . Breast cancer Maternal Uncle   . Bone cancer Maternal Grandmother     Social History   Socioeconomic History  . Marital status: Single    Spouse name: Not on file  . Number of children: Not on file  . Years of education: Not on file  . Highest education level: Not on file  Occupational History  . Not on file  Tobacco Use  . Smoking status: Never Smoker  . Smokeless tobacco: Never Used  Substance and Sexual Activity  . Alcohol use: No  . Drug use: No  . Sexual activity: Not on file  Other Topics Concern  . Not on file  Social History Narrative   Single   Has twin sister, Programmer, applications in high school   Not sexually active   Wants to be physical therapist   Social Determinants of Health   Financial Resource Strain:   . Difficulty of Paying Living Expenses:   Food Insecurity:   . Worried About Programme researcher, broadcasting/film/video in the Last Year:   . Ran  Out of Food in the Last Year:   Transportation Needs:   . Lack of Transportation (Medical):   Marland Kitchen Lack of Transportation (Non-Medical):   Physical Activity:   . Days of Exercise per Week:   . Minutes of Exercise per Session:   Stress:   . Feeling of Stress :   Social Connections:   . Frequency of Communication with Friends and Family:   . Frequency of Social Gatherings with Friends and Family:   . Attends Religious Services:   . Active Member of Clubs or Organizations:   . Attends Banker Meetings:   Marland Kitchen Marital Status:   Intimate Partner Violence:   . Fear of Current or Ex-Partner:   . Emotionally Abused:   Marland Kitchen Physically Abused:   . Sexually Abused:      ROS:  Constitutional: Pt reports headaches. Denies fever, malaise, fatigue, or abrupt weight changes.  HEENT: Denies eye pain, eye redness, ear pain, ringing in the ears, wax buildup, runny nose, nasal congestion, bloody nose, or sore throat. Respiratory: Denies difficulty breathing, shortness of breath, cough or sputum production.   Cardiovascular: Denies chest pain, chest tightness, palpitations or swelling in the hands or feet.  Gastrointestinal: Denies abdominal pain, bloating, constipation, diarrhea or blood in the stool.  GU: Denies frequency, urgency, pain with urination, blood in urine, odor or discharge. Musculoskeletal: Denies decrease in range of motion, difficulty with gait, muscle pain or joint pain and swelling.  Skin: Denies redness, rashes, lesions or ulcercations.  Neurological: Denies dizziness, difficulty with memory, difficulty with speech or problems with balance and coordination.  Psych: Denies anxiety, depression, SI/HI.  No other specific complaints in a complete review of systems (except as listed in HPI above).  PE:  BP 118/74   Pulse 96   Temp 98.2 F (36.8 C) (Temporal)   Ht 5' 3.5" (1.613 m)   Wt 180 lb (81.6 kg)   LMP 08/10/2019   SpO2 98%   BMI 31.39 kg/m   Wt Readings from Last 3 Encounters:  04/18/19 135 lb (61.2 kg)  04/14/19 175 lb (79.4 kg)  04/03/19 174 lb 2.6 oz (79 kg)    General: Appears her stated age, well developed, well nourished in NAD. HEENT: Head: normal shape and size;  Cardiovascular: Normal rate and rhythm. S1,S2 noted.  No murmur, rubs or gallops noted.  Pulmonary/Chest: Normal effort and positive vesicular breath sounds. No respiratory distress. No wheezes, rales or ronchi noted.  Musculoskeletal: No difficulty with gait.  Neurological: Alert and oriented.  Psychiatric: Mood and affect normal. Behavior is normal. Judgment and thought content normal.    BMET    Component Value Date/Time   NA 137 03/20/2019 1559    K 3.8 03/20/2019 1559   CL 102 03/20/2019 1559   CO2 27 03/20/2019 1559   GLUCOSE 81 03/20/2019 1559   BUN 12 03/20/2019 1559   CREATININE 0.69 03/20/2019 1559   CALCIUM 9.5 03/20/2019 1559    Lipid Panel     Component Value Date/Time   CHOL 227 (H) 04/19/2014 1238   TRIG 161.0 (H) 04/19/2014 1238   HDL 68.70 04/19/2014 1238   CHOLHDL 3 04/19/2014 1238   VLDL 32.2 04/19/2014 1238   LDLCALC 126 (H) 04/19/2014 1238    CBC    Component Value Date/Time   WBC 11.9 (H) 12/09/2018 0953   RBC 4.37 12/09/2018 0953   HGB 13.1 12/09/2018 0953   HCT 40.5 12/09/2018 0953   PLT 341.0 12/09/2018 0953  MCV 92.7 12/09/2018 0953   MCH 29.4 07/02/2016 1340   MCHC 32.3 12/09/2018 0953   RDW 14.0 12/09/2018 0953   RDW 14.7 08/04/2013 1348   LYMPHSABS 2.2 12/09/2018 0953   LYMPHSABS 2.3 08/04/2013 1348   MONOABS 0.6 12/09/2018 0953   EOSABS 0.1 12/09/2018 0953   EOSABS 0.0 08/04/2013 1348   BASOSABS 0.0 12/09/2018 0953   BASOSABS 0.0 08/04/2013 1348    Hgb A1C No results found for: HGBA1C   Assessment and Plan:   Webb Silversmith, NP This visit occurred during the SARS-CoV-2 public health emergency.  Safety protocols were in place, including screening questions prior to the visit, additional usage of staff PPE, and extensive cleaning of exam room while observing appropriate contact time as indicated for disinfecting solutions.

## 2019-09-13 NOTE — Assessment & Plan Note (Signed)
Continue Iron/Vit C and OCP Will check CBC yearly

## 2019-10-16 ENCOUNTER — Encounter: Payer: Managed Care, Other (non HMO) | Admitting: Internal Medicine

## 2019-11-12 ENCOUNTER — Encounter: Payer: Self-pay | Admitting: Internal Medicine

## 2019-11-16 ENCOUNTER — Ambulatory Visit: Payer: 59 | Admitting: Internal Medicine

## 2019-11-16 ENCOUNTER — Other Ambulatory Visit: Payer: Self-pay

## 2019-11-16 VITALS — BP 120/78 | HR 98 | Temp 98.3°F | Wt 185.0 lb

## 2019-11-16 DIAGNOSIS — R030 Elevated blood-pressure reading, without diagnosis of hypertension: Secondary | ICD-10-CM

## 2019-11-16 DIAGNOSIS — R519 Headache, unspecified: Secondary | ICD-10-CM

## 2019-11-16 DIAGNOSIS — R5383 Other fatigue: Secondary | ICD-10-CM | POA: Diagnosis not present

## 2019-11-16 DIAGNOSIS — D508 Other iron deficiency anemias: Secondary | ICD-10-CM

## 2019-11-16 NOTE — Progress Notes (Signed)
Subjective:    Patient ID: Natalie Wu, female    DOB: [birth year], 29 y.o.   MRN: [id number]  HPI  Patient presents the clinic today with complaint of elevated blood pressure.  She reports on Sunday her blood pressure was 138/101 and on Saturday her blood pressure was 124/102.  She has never had elevated blood pressure.  She has been having intermittent headaches located in her forehead or temples.  She describes the pain as pressure.  She denies neck pain, dizziness or visual changes.  She denies chest pain, chest tightness or shortness of breath.  She reports family history of hypertension.  She has been having some fatigue.  She would like her blood counts and iron levels checked today due to history of anemia.  Review of Systems      Past Medical History:  Diagnosis Date  . Complication of anesthesia    Slow to wake up  . GERD (gastroesophageal reflux disease)   . Iron deficiency anemia   . Migraine     Current Outpatient Medications  Medication Sig Dispense Refill  . ALPRAZolam (XANAX) 0.5 MG tablet Take 1 tablet (0.5 mg total) by mouth 3 (three) times daily as needed for anxiety. 20 tablet 0  . ibuprofen (ADVIL) 600 MG tablet Take 1 tablet (600 mg total) by mouth every 8 (eight) hours as needed for mild pain or moderate pain. 30 tablet 0  . Iron-Vitamin C (VITRON-C) 65-125 MG TABS Take 1 tablet by mouth every morning.    . Norgestimate-Ethinyl Estradiol Triphasic (TRI-PREVIFEM) 0.18/0.215/0.25 MG-35 MCG tablet Take 1 tablet by mouth daily. 84 tablet 3  . ondansetron (ZOFRAN ODT) 4 MG disintegrating tablet Take 1 tablet (4 mg total) by mouth every 8 (eight) hours as needed for nausea or vomiting. 20 tablet 0   No current facility-administered medications for this visit.    No Known Allergies  Family History  Problem Relation Age of Onset  . Breast cancer Maternal Uncle   . Breast cancer Maternal Grandmother   . Hyperlipidemia Father   . Hypertension Father   .  Lung cancer Maternal Grandfather   . Bone cancer Paternal Grandmother     Social History   Socioeconomic History  . Marital status: Single    Spouse name: Not on file  . Number of children: Not on file  . Years of education: Not on file  . Highest education level: Not on file  Occupational History  . Not on file  Tobacco Use  . Smoking status: Never Smoker  . Smokeless tobacco: Never Used  Vaping Use  . Vaping Use: Never used  Substance and Sexual Activity  . Alcohol use: Yes    Comment: occasional  . Drug use: No  . Sexual activity: Not on file  Other Topics Concern  . Not on file  Social History Narrative   Single   Has twin sister, Optician, dispensing in high school   Not sexually active   Wants to be physical therapist   Social Determinants of Health   Financial Resource Strain:   . Difficulty of Paying Living Expenses:   Food Insecurity:   . Worried About Charity fundraiser in the Last Year:   . Arboriculturist in the Last Year:   Transportation Needs:   . Film/video editor (Medical):   Marland Kitchen Lack of Transportation (Non-Medical):   Physical Activity:   . Days of Exercise per Week:   . Minutes of Exercise  per Session:   Stress:   . Feeling of Stress :   Social Connections:   . Frequency of Communication with Friends and Family:   . Frequency of Social Gatherings with Friends and Family:   . Attends Religious Services:   . Active Member of Clubs or Organizations:   . Attends Banker Meetings:   Marland Kitchen Marital Status:   Intimate Partner Violence:   . Fear of Current or Ex-Partner:   . Emotionally Abused:   Marland Kitchen Physically Abused:   . Sexually Abused:      Constitutional: Patient reports intermittent headaches, fatigue.  Denies fever, malaise, or abrupt weight changes.  Respiratory: Denies difficulty breathing, shortness of breath, cough or sputum production.   Cardiovascular: Denies chest pain, chest tightness, palpitations or swelling in the hands  or feet.  Neurological: Denies dizziness, difficulty with memory, difficulty with speech or problems with balance and coordination.    No other specific complaints in a complete review of systems (except as listed in HPI above).  Objective:   Physical Exam  BP 120/78   Pulse 98   Temp 98.3 F (36.8 C) (Temporal)   Wt 185 lb (83.9 kg)   SpO2 98%   BMI 32.26 kg/m   Wt Readings from Last 3 Encounters:  09/13/19 180 lb (81.6 kg)  04/18/19 135 lb (61.2 kg)  04/14/19 175 lb (79.4 kg)    General: Appears her stated age, obese, in NAD. Cardiovascular: Normal rate and rhythm. S1,S2 noted.  No murmur, rubs or gallops noted.  Pulmonary/Chest: Normal effort and positive vesicular breath sounds. No respiratory distress. No wheezes, rales or ronchi noted.  Neurological: Alert and oriented.    BMET    Component Value Date/Time   NA 137 03/20/2019 1559   K 3.8 03/20/2019 1559   CL 102 03/20/2019 1559   CO2 27 03/20/2019 1559   GLUCOSE 81 03/20/2019 1559   BUN 12 03/20/2019 1559   CREATININE 0.69 03/20/2019 1559   CALCIUM 9.5 03/20/2019 1559    Lipid Panel     Component Value Date/Time   CHOL 227 (H) 04/19/2014 1238   TRIG 161.0 (H) 04/19/2014 1238   HDL 68.70 04/19/2014 1238   CHOLHDL 3 04/19/2014 1238   VLDL 32.2 04/19/2014 1238   LDLCALC 126 (H) 04/19/2014 1238    CBC    Component Value Date/Time   WBC 11.9 (H) 12/09/2018 0953   RBC 4.37 12/09/2018 0953   HGB 13.1 12/09/2018 0953   HCT 40.5 12/09/2018 0953   PLT 341.0 12/09/2018 0953   MCV 92.7 12/09/2018 0953   MCH 29.4 07/02/2016 1340   MCHC 32.3 12/09/2018 0953   RDW 14.0 12/09/2018 0953   RDW 14.7 08/04/2013 1348   LYMPHSABS 2.2 12/09/2018 0953   LYMPHSABS 2.3 08/04/2013 1348   MONOABS 0.6 12/09/2018 0953   EOSABS 0.1 12/09/2018 0953   EOSABS 0.0 08/04/2013 1348   BASOSABS 0.0 12/09/2018 0953   BASOSABS 0.0 08/04/2013 1348    Hgb A1C No results found for: HGBA1C          Assessment & Plan:    Elevated Blood Pressure:  BP normal today Encouraged DASH diet and exercise for weight loss We will continue to monitor  Headaches:  Likely stress related Encourage stress reduction  Fatigue:  We will check CBC, IBC panel, ferritin, vitamin D, B12 and TSH today  Return precautions discussed Nicki Reaper, NP This visit occurred during the SARS-CoV-2 public health emergency.  Safety protocols were in  place, including screening questions prior to the visit, additional usage of staff PPE, and extensive cleaning of exam room while observing appropriate contact time as indicated for disinfecting solutions.

## 2019-11-17 ENCOUNTER — Encounter: Payer: Self-pay | Admitting: Internal Medicine

## 2019-11-17 LAB — IBC PANEL
Iron: 73 ug/dL (ref 42–145)
Saturation Ratios: 11.4 % — ABNORMAL LOW (ref 20.0–50.0)
Transferrin: 456 mg/dL — ABNORMAL HIGH (ref 212.0–360.0)

## 2019-11-17 LAB — CBC
HCT: 36.6 % (ref 36.0–46.0)
Hemoglobin: 12.2 g/dL (ref 12.0–15.0)
MCHC: 33.4 g/dL (ref 30.0–36.0)
MCV: 86.2 fl (ref 78.0–100.0)
Platelets: 321 10*3/uL (ref 150.0–400.0)
RBC: 4.24 Mil/uL (ref 3.87–5.11)
RDW: 14.1 % (ref 11.5–15.5)
WBC: 7.8 10*3/uL (ref 4.0–10.5)

## 2019-11-17 LAB — VITAMIN D 25 HYDROXY (VIT D DEFICIENCY, FRACTURES): VITD: 30.28 ng/mL (ref 30.00–100.00)

## 2019-11-17 LAB — VITAMIN B12: Vitamin B-12: 263 pg/mL (ref 211–911)

## 2019-11-17 LAB — TSH: TSH: 1.73 u[IU]/mL (ref 0.35–4.50)

## 2019-11-17 LAB — FERRITIN: Ferritin: 5.3 ng/mL — ABNORMAL LOW (ref 10.0–291.0)

## 2019-11-17 NOTE — Patient Instructions (Signed)

## 2019-11-22 ENCOUNTER — Encounter: Payer: Self-pay | Admitting: Internal Medicine

## 2019-11-22 ENCOUNTER — Telehealth: Payer: Self-pay | Admitting: Family Medicine

## 2019-11-22 MED ORDER — BLISOVI 24 FE 1-20 MG-MCG(24) PO TABS: 1.0000 | ORAL_TABLET | Freq: Every day | ORAL | 5 refills | Status: DC

## 2019-11-22 NOTE — Telephone Encounter (Signed)
Please see message and advise.  Thank you. Last OV 04/21/19 Last fill 03/29/19  #28/8

## 2019-11-22 NOTE — Telephone Encounter (Signed)
Patient has new insurance starting July 1st and will have to transfer to a [place] provider. She has a TOC scheduled at [facility] but only needs a refill for birth control. Patient would like to know if we can refill her birth control through August instead of having that one appt with [facility] and then transferring physicians again in July.

## 2019-11-22 NOTE — Telephone Encounter (Signed)
Rx refilled.

## 2019-11-23 ENCOUNTER — Ambulatory Visit: Payer: Managed Care, Other (non HMO) | Admitting: Internal Medicine

## 2019-11-23 ENCOUNTER — Encounter: Payer: Self-pay | Admitting: Internal Medicine

## 2019-11-23 ENCOUNTER — Other Ambulatory Visit: Payer: Self-pay

## 2019-11-25 NOTE — Progress Notes (Signed)
Erroneous error

## 2020-02-06 DIAGNOSIS — Z23 Encounter for immunization: Secondary | ICD-10-CM | POA: Diagnosis not present

## 2020-02-08 ENCOUNTER — Encounter: Payer: Self-pay | Admitting: Internal Medicine

## 2020-02-20 ENCOUNTER — Encounter: Payer: Self-pay | Admitting: Internal Medicine

## 2020-02-20 ENCOUNTER — Telehealth: Payer: Self-pay

## 2020-02-20 ENCOUNTER — Ambulatory Visit
Admission: EM | Admit: 2020-02-20 | Discharge: 2020-02-20 | Disposition: A | Discharge status: Home / Self Care | Payer: 59 | Attending: Emergency Medicine | Admitting: Emergency Medicine

## 2020-02-20 DIAGNOSIS — T7840XA Allergy, unspecified, initial encounter: Secondary | ICD-10-CM

## 2020-02-20 DIAGNOSIS — T783XXA Angioneurotic edema, initial encounter: Secondary | ICD-10-CM

## 2020-02-20 DIAGNOSIS — R220 Localized swelling, mass and lump, head: Secondary | ICD-10-CM | POA: Diagnosis not present

## 2020-02-20 DIAGNOSIS — Z20828 Contact with and (suspected) exposure to other viral communicable diseases: Secondary | ICD-10-CM | POA: Diagnosis not present

## 2020-02-20 MED ORDER — PREDNISONE 10 MG (21) PO TBPK: ORAL_TABLET | Freq: Every day | ORAL | 0 refills | Status: DC

## 2020-02-20 NOTE — Discharge Instructions (Signed)
Take the prednisone as directed.  Continue the Benadryl as directed.  Do not drive, operate machinery, or drink alcohol with Benadryl as it may cause drowsiness.    Follow-up with your primary care provider or an allergist as directed.    Call 911 or go to the emergency department if you have difficulty swallowing or breathing;  or other concerning symptoms.

## 2020-02-20 NOTE — Telephone Encounter (Signed)
I spoke with pt; pt said has had swelling in lips before but never lasted this long. Pt said last night about 9:30 pm top lip became swollen,  Hurts and feels numb; pt took 2 Benadryl and went to sleep; this morning upper lip is still swollen and pt took 2 more benadryl but no relief from swelling. Pt also has sinus symptoms; pt does not have any problem breathing but pt said it feels "weird when she swallows"; could not explain in more detail. Pt thinks could be allergic reaction to drinking from an aquafina water bottle. No available appts at [facility]. Pt is 3' from [facility] in [place] and pt will go there now. FYI to Pamala Hurry NP.

## 2020-02-20 NOTE — Telephone Encounter (Signed)
Patinet contacted the office and is requesting an appt, as she is having an allergic reaction. She states this started yesterday evening - and her lips are very swollen. She denies any swelling of her tongue, lips, or throat - and denies any trouble breathing. She states she has taken benadryl, but this did not relieve her sx. She states she is unsure what could be causing this reaction. No new foods/drinks, soaps, facial cream, etc. We have no availability in the office today, but patient is requesting to be evaluated by someone.

## 2020-02-20 NOTE — ED Triage Notes (Signed)
Patient reports intermittent lip swelling. Reports the swelling always happens at night. Usually take benadryl and it goes away, but patient reports she took one dose last night and two more doses today and the swelling still has not gone away.

## 2020-02-20 NOTE — ED Provider Notes (Signed)
Renaldo Fiddler    CSN: [id number] Arrival date & time: 02/20/20  0932      History   Chief Complaint Chief Complaint  Patient presents with  . Oral Swelling  . Allergic Reaction    HPI Natalie Wu is a 29 y.o. female.   Patient presents with swelling of her upper lip since last night.  She took 2 Benadryl last night and 2 more Benadryl this morning with minimal relief.  No difficulty swallowing or breathing.  Patient states she has had intermittent swelling of her lips over the past month but it usually resolves with a dose of Benadryl.  She believes this to be due to the plastic bottle from which she drinks water.  No new products, detergents, foods, medications.  Patient is working with her PCP to get a referral to an allergist.  The history is provided by the patient.    Past Medical History:  Diagnosis Date  . Complication of anesthesia    Slow to wake up  . GERD (gastroesophageal reflux disease)   . Iron deficiency anemia   . Migraine     Patient Active Problem List   Diagnosis Date Noted  . Chronic headaches 03/20/2019  . Situational anxiety 09/20/2017  . Anemia, iron deficiency 06/27/2015    Past Surgical History:  Procedure Laterality Date  . CHOLECYSTECTOMY N/A 04/03/2019   Procedure: LAPAROSCOPIC CHOLECYSTECTOMY;  Surgeon: Henrene Dodge, MD;  Location: [facility] ORS;  Service: General;  Laterality: N/A;  . FOOT SURGERY    . TONSILLECTOMY    . WISDOM TOOTH EXTRACTION      OB History   No obstetric history on file.      Home Medications    Prior to Admission medications   Medication Sig Start Date End Date Taking? Authorizing Provider  Iron-Vitamin C (VITRON-C) 65-125 MG TABS Take 1 tablet by mouth every morning.   Yes [provider]  Norgestimate-Ethinyl Estradiol Triphasic (TRI-PREVIFEM) 0.18/0.215/0.25 MG-35 MCG tablet Take 1 tablet by mouth daily. 07/03/19  Yes Dianne Dun, MD  ALPRAZolam Prudy Feeler) 0.5 MG tablet Take 1 tablet (0.5 mg  total) by mouth 3 (three) times daily as needed for anxiety. 09/20/17   Dianne Dun, MD  ibuprofen (ADVIL) 600 MG tablet Take 1 tablet (600 mg total) by mouth every 8 (eight) hours as needed for mild pain or moderate pain. 04/03/19   Piscoya, Elita Quick, MD  ondansetron (ZOFRAN ODT) 4 MG disintegrating tablet Take 1 tablet (4 mg total) by mouth every 8 (eight) hours as needed for nausea or vomiting. 04/03/19   Piscoya, Elita Quick, MD  predniSONE (STERAPRED UNI-PAK 21 TAB) 10 MG (21) TBPK tablet Take by mouth daily. As directed 02/20/20   Mickie Bail, NP    Family History Family History  Problem Relation Age of Onset  . Breast cancer Maternal Uncle   . Breast cancer Maternal Grandmother   . Hyperlipidemia Father   . Hypertension Father   . Lung cancer Maternal Grandfather   . Bone cancer Paternal Grandmother     Social History Social History   Tobacco Use  . Smoking status: Never Smoker  . Smokeless tobacco: Never Used  Vaping Use  . Vaping Use: Never used  Substance Use Topics  . Alcohol use: Yes    Comment: occasional  . Drug use: No     Allergies   Patient has no known allergies.   Review of Systems Review of Systems  Constitutional: Negative for chills and fever.  HENT: Positive for facial swelling. Negative for ear pain, sore throat and trouble swallowing.   Eyes: Negative for pain and visual disturbance.  Respiratory: Negative for cough and shortness of breath.   Cardiovascular: Negative for chest pain and palpitations.  Gastrointestinal: Negative for abdominal pain and vomiting.  Genitourinary: Negative for dysuria and hematuria.  Musculoskeletal: Negative for arthralgias and back pain.  Skin: Negative for color change and rash.  Neurological: Negative for seizures and syncope.  All other systems reviewed and are negative.    Physical Exam Triage Vital Signs ED Triage Vitals  Enc Vitals Group     BP      Pulse      Resp      Temp      Temp src      SpO2       Weight      Height      Head Circumference      Peak Flow      Pain Score      Pain Loc      Pain Edu?      Excl. in [place]?    No data found.  Updated Vital Signs BP (!) 138/99   Pulse 90   Temp 98.3 F (36.8 C)   Resp 12   LMP 02/20/2020 (Exact Date)   SpO2 99%   Visual Acuity Right Eye Distance:   Left Eye Distance:   Bilateral Distance:    Right Eye Near:   Left Eye Near:    Bilateral Near:     Physical Exam Vitals and nursing note reviewed.  Constitutional:      General: She is not in acute distress.    Appearance: She is well-developed.  HENT:     Head: Normocephalic and atraumatic.     Mouth/Throat:     Mouth: Mucous membranes are moist.     Pharynx: Oropharynx is clear.     Comments: Left upper lip swelling.  No swelling of her tongue or pharynx.  No difficulty swallowing or breathing.  Speech clear. Eyes:     Conjunctiva/sclera: Conjunctivae normal.  Cardiovascular:     Rate and Rhythm: Normal rate and regular rhythm.     Heart sounds: No murmur heard.   Pulmonary:     Effort: Pulmonary effort is normal. No respiratory distress.     Breath sounds: Normal breath sounds. No stridor. No wheezing or rhonchi.  Abdominal:     Palpations: Abdomen is soft.     Tenderness: There is no abdominal tenderness.  Musculoskeletal:     Cervical back: Neck supple.  Skin:    General: Skin is warm and dry.     Findings: No bruising, erythema, lesion or rash.  Neurological:     General: No focal deficit present.     Mental Status: She is alert and oriented to person, place, and time.     Gait: Gait normal.  Psychiatric:        Mood and Affect: Mood normal.        Behavior: Behavior normal.      UC Treatments / Results  Labs (all labs ordered are listed, but only abnormal results are displayed) Labs Reviewed - No data to display  EKG   Radiology No results found.  Procedures Procedures (including critical care time)  Medications Ordered in  UC Medications - No data to display  Initial Impression / Assessment and Plan / UC Course  I have reviewed the triage vital signs and  the nursing notes.  Pertinent labs & imaging results that were available during my care of the patient were reviewed by me and considered in my medical decision making (see chart for details).   Lip swelling due to allergic reaction.  Treating with prednisone and Benadryl.  Precautions for drowsiness with Benadryl discussed with patient.  Instructed her to follow-up with her PCP and an allergist as soon as possible.  Instructed her to call 911 or go to the ED if she has difficulty swallowing or breathing or other concerns.  Patient agrees to plan of care.   Final Clinical Impressions(s) / UC Diagnoses   Final diagnoses:  Lip swelling  Allergic reaction, initial encounter     Discharge Instructions     Take the prednisone as directed.  Continue the Benadryl as directed.  Do not drive, operate machinery, or drink alcohol with Benadryl as it may cause drowsiness.    Follow-up with your primary care provider or an allergist as directed.    Call 911 or go to the emergency department if you have difficulty swallowing or breathing;  or other concerning symptoms.    ED Prescriptions    Medication Sig Dispense Auth. Provider   predniSONE (STERAPRED UNI-PAK 21 TAB) 10 MG (21) TBPK tablet Take by mouth daily. As directed 21 tablet Mickie Bail, NP     PDMP not reviewed this encounter.   Mickie Bail, NP 02/20/20 1010

## 2020-02-20 NOTE — Telephone Encounter (Signed)
I would have added her on had I been asked.

## 2020-02-28 DIAGNOSIS — T7840XA Allergy, unspecified, initial encounter: Secondary | ICD-10-CM | POA: Diagnosis not present

## 2020-03-01 DIAGNOSIS — J301 Allergic rhinitis due to pollen: Secondary | ICD-10-CM | POA: Diagnosis not present

## 2020-04-30 ENCOUNTER — Other Ambulatory Visit: Payer: Self-pay | Admitting: *Deleted

## 2020-04-30 ENCOUNTER — Telehealth: Payer: Self-pay

## 2020-04-30 ENCOUNTER — Encounter: Payer: Self-pay | Admitting: Family

## 2020-04-30 ENCOUNTER — Telehealth: Payer: Self-pay | Admitting: Family

## 2020-04-30 DIAGNOSIS — D508 Other iron deficiency anemias: Secondary | ICD-10-CM

## 2020-04-30 DIAGNOSIS — D500 Iron deficiency anemia secondary to blood loss (chronic): Secondary | ICD-10-CM

## 2020-04-30 NOTE — Telephone Encounter (Signed)
Called and LMVm for patient regarding appointments scheduled per 11/30 sch msg

## 2020-04-30 NOTE — Telephone Encounter (Signed)
Returned pts call that she has to r/s her 05/02/20 appt due to her work sch,,,,AOM

## 2020-05-02 ENCOUNTER — Ambulatory Visit: Payer: Managed Care, Other (non HMO) | Admitting: Family

## 2020-05-02 ENCOUNTER — Other Ambulatory Visit: Payer: Managed Care, Other (non HMO)

## 2020-05-17 ENCOUNTER — Inpatient Hospital Stay: Payer: 59 | Attending: Family

## 2020-05-17 ENCOUNTER — Inpatient Hospital Stay: Payer: 59 | Admitting: Family

## 2020-08-06 DIAGNOSIS — H52213 Irregular astigmatism, bilateral: Secondary | ICD-10-CM | POA: Diagnosis not present

## 2020-09-04 ENCOUNTER — Encounter: Payer: Self-pay | Admitting: Internal Medicine

## 2020-09-04 MED ORDER — NORGESTIM-ETH ESTRAD TRIPHASIC 0.18/0.215/0.25 MG-35 MCG PO TABS: 1.0000 | ORAL_TABLET | Freq: Every day | ORAL | 0 refills | Status: DC

## 2020-09-28 ENCOUNTER — Emergency Department
Admission: EM | Admit: 2020-09-28 | Discharge: 2020-09-28 | Disposition: A | Discharge status: Home / Self Care | Payer: Managed Care, Other (non HMO) | Attending: Emergency Medicine | Admitting: Emergency Medicine

## 2020-09-28 ENCOUNTER — Other Ambulatory Visit: Payer: Self-pay

## 2020-09-28 ENCOUNTER — Emergency Department: Payer: Managed Care, Other (non HMO)

## 2020-09-28 DIAGNOSIS — N132 Hydronephrosis with renal and ureteral calculous obstruction: Secondary | ICD-10-CM | POA: Insufficient documentation

## 2020-09-28 DIAGNOSIS — N3000 Acute cystitis without hematuria: Secondary | ICD-10-CM | POA: Insufficient documentation

## 2020-09-28 DIAGNOSIS — R109 Unspecified abdominal pain: Secondary | ICD-10-CM | POA: Diagnosis present

## 2020-09-28 DIAGNOSIS — N200 Calculus of kidney: Secondary | ICD-10-CM

## 2020-09-28 DIAGNOSIS — Z85828 Personal history of other malignant neoplasm of skin: Secondary | ICD-10-CM | POA: Insufficient documentation

## 2020-09-28 LAB — CBC
HCT: 36.4 % (ref 36.0–46.0)
Hemoglobin: 11.6 g/dL — ABNORMAL LOW (ref 12.0–15.0)
MCH: 27.2 pg (ref 26.0–34.0)
MCHC: 31.9 g/dL (ref 30.0–36.0)
MCV: 85.4 fL (ref 80.0–100.0)
Platelets: 304 10*3/uL (ref 150–400)
RBC: 4.26 MIL/uL (ref 3.87–5.11)
RDW: 13.8 % (ref 11.5–15.5)
WBC: 9 10*3/uL (ref 4.0–10.5)
nRBC: 0 % (ref 0.0–0.2)

## 2020-09-28 LAB — URINALYSIS, COMPLETE (UACMP) WITH MICROSCOPIC
Bilirubin Urine: NEGATIVE
Glucose, UA: NEGATIVE mg/dL
Ketones, ur: NEGATIVE mg/dL
Nitrite: POSITIVE — AB
Protein, ur: NEGATIVE mg/dL
Specific Gravity, Urine: 1.004 — ABNORMAL LOW (ref 1.005–1.030)
pH: 7 (ref 5.0–8.0)

## 2020-09-28 LAB — BASIC METABOLIC PANEL
Anion gap: 9 (ref 5–15)
BUN: 11 mg/dL (ref 6–20)
CO2: 23 mmol/L (ref 22–32)
Calcium: 9 mg/dL (ref 8.9–10.3)
Chloride: 105 mmol/L (ref 98–111)
Creatinine, Ser: 0.82 mg/dL (ref 0.44–1.00)
GFR, Estimated: >60 mL/min (ref >60)
Glucose, Bld: 119 mg/dL — ABNORMAL HIGH (ref 70–99)
Potassium: 3.4 mmol/L — ABNORMAL LOW (ref 3.5–5.1)
Sodium: 137 mmol/L (ref 135–145)

## 2020-09-28 LAB — POC URINE PREG, ED: Preg Test, Ur: NEGATIVE

## 2020-09-28 MED ORDER — SODIUM CHLORIDE 0.9 % IV BOLUS
500.0000 mL | Freq: Once | INTRAVENOUS | Status: AC
  Administered 2020-09-28: 500 mL via INTRAVENOUS

## 2020-09-28 MED ORDER — ONDANSETRON 4 MG PO TBDP: 4.0000 mg | ORAL_TABLET | Freq: Three times a day (TID) | ORAL | 0 refills | Status: DC | PRN

## 2020-09-28 MED ORDER — KETOROLAC TROMETHAMINE 10 MG PO TABS: 10.0000 mg | ORAL_TABLET | Freq: Three times a day (TID) | ORAL | 0 refills | Status: DC

## 2020-09-28 MED ORDER — ONDANSETRON HCL 4 MG/2ML IJ SOLN
4.0000 mg | Freq: Once | INTRAMUSCULAR | Status: DC
  Filled 2020-09-28: qty 2

## 2020-09-28 MED ORDER — CEPHALEXIN 500 MG PO CAPS: 500.0000 mg | ORAL_CAPSULE | Freq: Three times a day (TID) | ORAL | 0 refills | Status: AC

## 2020-09-28 MED ORDER — SODIUM CHLORIDE 0.9 % IV SOLN
1.0000 g | Freq: Once | INTRAVENOUS | Status: AC
  Administered 2020-09-28: 1 g via INTRAVENOUS
  Filled 2020-09-28: qty 10

## 2020-09-28 MED ORDER — KETOROLAC TROMETHAMINE 30 MG/ML IJ SOLN
30.0000 mg | Freq: Once | INTRAMUSCULAR | Status: AC
  Administered 2020-09-28: 30 mg via INTRAVENOUS
  Filled 2020-09-28: qty 1

## 2020-09-28 NOTE — ED Notes (Signed)
Pt verbalized understanding of d/c instructions at this time. Pt provided the opportunity to ask questions at this time. Pt ambulatory to ED lobby, NAD noted, steady gait noted, RR even and unlabored at this time.

## 2020-09-28 NOTE — ED Triage Notes (Signed)
Pt to ED for left flank pain since Friday, has been on antibiotics and pyridium since Friday for UTI. +N/V.  Pt appears uncomfortable Denies burning with urination

## 2020-09-28 NOTE — Discharge Instructions (Addendum)
Take the antibiotic as directed until all pills are gone.  Discontinued previously prescribed Cipro.  Take the prescription pain and nausea medicines as needed.  Return to the ED immediately for worsening pain, fever, pain, or frank blood.

## 2020-09-29 NOTE — ED Provider Notes (Signed)
[facility] Emergency Department Provider Note ____________________________________________  Time seen: 1557  I have reviewed the triage vital signs and the nursing notes.  HISTORY  Chief Complaint  Flank Pain  HPI [name] is a 30 y.o. female presents to the ED for evaluation of worsening left flank pain. She was seen at started on Cipro and pyridium. She was started on the meds since Friday. She has had ongoing nausea and vomiting. She denies fevers, chills, or sweats.   Past Medical History:  Diagnosis Date  . Anemia   . Skin cancer     Patient Active Problem List   Diagnosis Date Noted  . Adjustment disorder with mixed emotional features 09/15/2017  . Iron deficiency anemia 06/18/2010  . ANEMIA, IRON DEFICIENCY, HX OF 06/18/2010    Past Surgical History:  Procedure Laterality Date  . TONSILLECTOMY     age 65    Prior to Admission medications   Medication Sig Start Date End Date Taking? Authorizing Provider  cephALEXin (KEFLEX) 500 MG capsule Take 1 capsule (500 mg total) by mouth 3 (three) times daily for 7 days. 09/28/20 10/05/20 Yes Steward Sames, Dannielle Karvonen, PA-C  ketorolac (TORADOL) 10 MG tablet Take 1 tablet (10 mg total) by mouth every 8 (eight) hours. 09/28/20  Yes Gwenda Heiner, Dannielle Karvonen, PA-C  ondansetron (ZOFRAN ODT) 4 MG disintegrating tablet Take 1 tablet (4 mg total) by mouth every 8 (eight) hours as needed. 09/28/20  Yes Kreg Earhart, Dannielle Karvonen, PA-C  BLISOVI 24 FE 1-20 MG-MCG(24) tablet Take 1 tablet by mouth daily. 11/22/19   Cirigliano, Garvin Fila, DO  buPROPion (WELLBUTRIN XL) 150 MG 24 hr tablet TAKE 1 TABLET BY MOUTH EVERY DAY 10/08/17   Lucille Passy, MD  QUEtiapine (SEROQUEL XR) 50 MG TB24 24 hr tablet TAKE 1 TABLET BY MOUTH EVERYDAY AT BEDTIME 12/07/18   Lucille Passy, MD    Allergies Patient has no known allergies.  History reviewed. No pertinent family history.  Social History Social History   Tobacco Use  . Smoking status:  Never Smoker  . Smokeless tobacco: Never Used  Vaping Use  . Vaping Use: Never used  Substance Use Topics  . Alcohol use: No    Alcohol/week: 0.0 standard drinks  . Drug use: No    Review of Systems  Constitutional: Negative for fever. Cardiovascular: Negative for chest pain. Respiratory: Negative for shortness of breath. Gastrointestinal: Negative for abdominal pain, vomiting and diarrhea. Left flank pain. Genitourinary: Positive for dysuria. Musculoskeletal: Negative for back pain. Skin: Negative for rash. Neurological: Negative for headaches, focal weakness or numbness. ____________________________________________  PHYSICAL EXAM:  VITAL SIGNS: ED Triage Vitals  Enc Vitals Group     BP 09/28/20 1339 130/76     Pulse Rate 09/28/20 1339 93     Resp 09/28/20 1339 18     Temp 09/28/20 1339 98 F (36.7 C)     Temp Source 09/28/20 1339 Oral     SpO2 09/28/20 1339 100 %     Weight 09/28/20 1340 155 lb (70.3 kg)     Height 09/28/20 1340 5\' 3"  (1.6 m)     Head Circumference --      Peak Flow --      Pain Score 09/28/20 1340 7     Pain Loc --      Pain Edu? --      Excl. in [place]? --     Constitutional: Alert and oriented. Well appearing and in no  distress. Head: Normocephalic and atraumatic. Eyes: Conjunctivae are normal.  Normal extraocular movements Cardiovascular: Normal rate, regular rhythm. Normal distal pulses. Respiratory: Normal respiratory effort. No wheezes/rales/rhonchi. Gastrointestinal: Soft and nontender. No distention. Left flank pain Musculoskeletal: Nontender with normal range of motion in all extremities.  Neurologic:  Normal gait without ataxia. Normal speech and language. No gross focal neurologic deficits are appreciated. Skin:  Skin is warm, dry and intact. No rash noted. Psychiatric: Mood and affect are normal. Patient exhibits appropriate insight and judgment. ____________________________________________   LABS (pertinent  positives/negatives)  Labs Reviewed  URINALYSIS, COMPLETE (UACMP) WITH MICROSCOPIC - Abnormal; Notable for the following components:      Result Value   Color, Urine AMBER (*)    APPearance HAZY (*)    Specific Gravity, Urine 1.004 (*)    Hgb urine dipstick SMALL (*)    Nitrite POSITIVE (*)    Leukocytes,Ua MODERATE (*)    Bacteria, UA RARE (*)    All other components within normal limits  CBC - Abnormal; Notable for the following components:   Hemoglobin 11.6 (*)    All other components within normal limits  BASIC METABOLIC PANEL - Abnormal; Notable for the following components:   Potassium 3.4 (*)    Glucose, Bld 119 (*)    All other components within normal limits  URINE CULTURE  POC URINE PREG, ED  ____________________________________________   RADIOLOGY  CT Renal Stone Study  IMPRESSION: 1 mm obstructing calculus at the left ureterovesical junction. Left hydroureteronephrosis. ____________________________________________  PROCEDURES  Toradol 30 mg IVP ceftriaxone 1 g IVPB NS bolus 500 ml IVPB  Procedures ____________________________________________   INITIAL IMPRESSION / ASSESSMENT AND PLAN / ED COURSE  As part of my medical decision making, I reviewed the following data within the [place] reviewed WNL, Old chart reviewed, Radiograph reviewed as noted and Notes from prior ED visits     DDX: kidney stones, pyelonephritis, UTI  Patient ED evaluation of ongoing left flank pain, nausea and vomiting after being treated for UTI.  She started empirically on Cipro from her PCP.  Patient presents today for ongoing symptoms.  She was evaluated with a CT scan which did reveal hydronephrosis on the left, and a 1 mL nonobstructing stone in the UVJ.  He is started on ceftriaxone in the ED and will be discharged on Keflex at home.  She will discontinue the previously prescribed Cipro.  She reports significant improvement of her symptoms after fluid  bolus and IV Toradol.  She will follow-up with primary provider or return to the ED for worsening symptoms as discussed.    [name] was evaluated in Emergency Department on 09/29/2020 for the symptoms described in the history of present illness. She was evaluated in the context of the global COVID-19 pandemic, which necessitated consideration that the patient might be at risk for infection with the SARS-CoV-2 virus that causes COVID-19. Institutional protocols and algorithms that pertain to the evaluation of patients at risk for COVID-19 are in a state of rapid change based on information released by regulatory bodies including the CDC and federal and state organizations. These policies and algorithms were followed during the patient's care in the ED. ____________________________________________  FINAL CLINICAL IMPRESSION(S) / ED DIAGNOSES  Final diagnoses:  Acute cystitis without hematuria  Renal calculi      Melvenia Needles, PA-C 09/29/20 0115    Blake Divine, MD 10/03/20 [phone]

## 2020-09-30 LAB — URINE CULTURE: Culture: 60000 — AB

## 2020-10-08 ENCOUNTER — Ambulatory Visit: Payer: 59 | Admitting: Adult Health

## 2020-10-08 ENCOUNTER — Encounter: Payer: Self-pay | Admitting: Adult Health

## 2020-10-08 ENCOUNTER — Other Ambulatory Visit: Payer: Self-pay

## 2020-10-08 VITALS — BP 122/90 | HR 87 | Temp 97.9°F | Ht 64.17 in | Wt 202.4 lb

## 2020-10-08 DIAGNOSIS — E538 Deficiency of other specified B group vitamins: Secondary | ICD-10-CM

## 2020-10-08 DIAGNOSIS — Z713 Dietary counseling and surveillance: Secondary | ICD-10-CM | POA: Diagnosis not present

## 2020-10-08 DIAGNOSIS — R634 Abnormal weight loss: Secondary | ICD-10-CM | POA: Diagnosis not present

## 2020-10-08 DIAGNOSIS — E559 Vitamin D deficiency, unspecified: Secondary | ICD-10-CM | POA: Diagnosis not present

## 2020-10-08 DIAGNOSIS — D500 Iron deficiency anemia secondary to blood loss (chronic): Secondary | ICD-10-CM | POA: Diagnosis not present

## 2020-10-08 LAB — CBC WITH DIFFERENTIAL/PLATELET
Basophils Absolute: 0 10*3/uL (ref 0.0–0.1)
Basophils Relative: 0.3 % (ref 0.0–3.0)
Eosinophils Absolute: 0.1 10*3/uL (ref 0.0–0.7)
Eosinophils Relative: 1.3 % (ref 0.0–5.0)
HCT: 36.1 % (ref 36.0–46.0)
Hemoglobin: 11.9 g/dL — ABNORMAL LOW (ref 12.0–15.0)
Lymphocytes Relative: 28.2 % (ref 12.0–46.0)
Lymphs Abs: 1.8 10*3/uL (ref 0.7–4.0)
MCHC: 33.1 g/dL (ref 30.0–36.0)
MCV: 81.9 fl (ref 78.0–100.0)
Monocytes Absolute: 0.3 10*3/uL (ref 0.1–1.0)
Monocytes Relative: 5.2 % (ref 3.0–12.0)
Neutro Abs: 4.1 10*3/uL (ref 1.4–7.7)
Neutrophils Relative %: 65 % (ref 43.0–77.0)
Platelets: 313 10*3/uL (ref 150.0–400.0)
RBC: 4.4 Mil/uL (ref 3.87–5.11)
RDW: 15.2 % (ref 11.5–15.5)
WBC: 6.4 10*3/uL (ref 4.0–10.5)

## 2020-10-08 LAB — COMPREHENSIVE METABOLIC PANEL
ALT: 17 U/L (ref 0–35)
AST: 16 U/L (ref 0–37)
Albumin: 3.9 g/dL (ref 3.5–5.2)
Alkaline Phosphatase: 82 U/L (ref 39–117)
BUN: 14 mg/dL (ref 6–23)
CO2: 26 mEq/L (ref 19–32)
Calcium: 9 mg/dL (ref 8.4–10.5)
Chloride: 103 mEq/L (ref 96–112)
Creatinine, Ser: 0.72 mg/dL (ref 0.40–1.20)
GFR: 113.75 mL/min (ref >60.00)
Glucose, Bld: 76 mg/dL (ref 70–99)
Potassium: 3.7 mEq/L (ref 3.5–5.1)
Sodium: 137 mEq/L (ref 135–145)
Total Bilirubin: 0.4 mg/dL (ref 0.2–1.2)
Total Protein: 6.6 g/dL (ref 6.0–8.3)

## 2020-10-08 LAB — TSH: TSH: 2.52 u[IU]/mL (ref 0.35–4.50)

## 2020-10-08 LAB — VITAMIN D 25 HYDROXY (VIT D DEFICIENCY, FRACTURES): VITD: 26.77 ng/mL — ABNORMAL LOW (ref 30.00–100.00)

## 2020-10-08 LAB — VITAMIN B12: Vitamin B-12: 195 pg/mL — ABNORMAL LOW (ref 211–911)

## 2020-10-08 MED ORDER — HYDROCHLOROTHIAZIDE 12.5 MG PO TABS: 12.5000 mg | ORAL_TABLET | Freq: Every day | ORAL | 0 refills | Status: DC

## 2020-10-08 MED ORDER — PHENTERMINE HCL 37.5 MG PO CAPS: 37.5000 mg | ORAL_CAPSULE | ORAL | 0 refills | Status: DC

## 2020-10-08 NOTE — Progress Notes (Signed)
New Patient Office Visit  Subjective:  Patient ID: Natalie Wu, female    DOB: [birth year]  Age: 30 y.o. MRN: [id number]  CC:  Chief Complaint  Patient presents with  . Transitions Of Care    Physical Pt wants to discuss weight loss and wants labs for iron    HPI Natalie Wu presents for healthcare. She works of everyday 45 minutes daily. She has a good diet, and watching it. She has been on phentermine in past for B12 injections, phentermine. She was on it for 4 months- she lost some weight.   She has a history of some mildly elevated diastolic blood pressures for years and a familiy  History of hypertension  and she reports when on Phentermine she had no increase in pressures and she would liike to try this again.   Denies any cardiac or pulmonary issues.   She works at [employer].  Due for PAP she will call and schedule.   Patient  denies any fever, body aches,chills, rash, chest pain, shortness of breath, nausea, vomiting, or diarrhea.  Denies dizziness, lightheadedness, pre syncopal or syncopal episodes.      Patient's last menstrual period was 10/08/2020.   Past Medical History:  Diagnosis Date  . Complication of anesthesia    Slow to wake up  . GERD (gastroesophageal reflux disease)   . Iron deficiency anemia   . Migraine     Past Surgical History:  Procedure Laterality Date  . CHOLECYSTECTOMY N/A 04/03/2019   Procedure: LAPAROSCOPIC CHOLECYSTECTOMY;  Surgeon: Henrene Dodge, MD;  Location: [facility] ORS;  Service: General;  Laterality: N/A;  . FOOT SURGERY    . TONSILLECTOMY    . WISDOM TOOTH EXTRACTION      Family History  Problem Relation Age of Onset  . Breast cancer Maternal Uncle   . Breast cancer Maternal Grandmother   . Hyperlipidemia Father   . Hypertension Father   . Lung cancer Maternal Grandfather   . Bone cancer Paternal Grandmother     Social History   Socioeconomic History  . Marital status: Single    Spouse  name: Not on file  . Number of children: Not on file  . Years of education: Not on file  . Highest education level: Not on file  Occupational History  . Not on file  Tobacco Use  . Smoking status: Never Smoker  . Smokeless tobacco: Never Used  Vaping Use  . Vaping Use: Never used  Substance and Sexual Activity  . Alcohol use: Yes    Comment: occasional  . Drug use: No  . Sexual activity: Not on file  Other Topics Concern  . Not on file  Social History Narrative   Single   Has twin sister, Programmer, applications in high school   Not sexually active   Wants to be physical therapist   Social Determinants of Health   Financial Resource Strain: Not on file  Food Insecurity: Not on file  Transportation Needs: Not on file  Physical Activity: Not on file  Stress: Not on file  Social Connections: Not on file  Intimate Partner Violence: Not on file    ROS Review of Systems  Constitutional: Negative.   HENT: Negative.   Respiratory: Negative.   Cardiovascular: Negative.   Gastrointestinal: Negative.   Genitourinary: Negative.   Musculoskeletal: Negative.   Neurological: Negative.   Hematological: Negative.   Psychiatric/Behavioral: Negative.     Objective:   Today's Vitals: BP 122/90 (BP  Location: Left Arm, Patient Position: Sitting)   Pulse 87   Temp 97.9 F (36.6 C)   Ht 5' 4.17" (1.63 m)   Wt 202 lb 6.4 oz (91.8 kg)   LMP 10/08/2020   SpO2 99%   BMI 34.55 kg/m    Vitals with BMI 10/08/2020 02/20/2020 11/16/2019  Height 5' 4.173" - -  Weight 202 lbs 6 oz - 185 lbs  BMI 34.55 - -  Systolic 122 138 [id number]  Diastolic 90 99 78  Pulse 87 90 98   Physical Exam Vitals reviewed.  Constitutional:      General: She is not in acute distress.    Appearance: Normal appearance. She is well-developed. She is not diaphoretic.     Interventions: She is not intubated. HENT:     Head: Normocephalic and atraumatic.     Right Ear: External ear normal. There is no impacted cerumen.      Left Ear: External ear normal. There is no impacted cerumen.     Nose: Nose normal. No congestion or rhinorrhea.     Mouth/Throat:     Mouth: Mucous membranes are moist.     Pharynx: No oropharyngeal exudate or posterior oropharyngeal erythema.  Eyes:     General: Lids are normal. No scleral icterus.       Right eye: No discharge.        Left eye: No discharge.     Conjunctiva/sclera: Conjunctivae normal.     Right eye: Right conjunctiva is not injected. No exudate or hemorrhage.    Left eye: Left conjunctiva is not injected. No exudate or hemorrhage.    Pupils: Pupils are equal, round, and reactive to light.  Neck:     Thyroid: No thyroid mass or thyromegaly.     Vascular: Normal carotid pulses. No carotid bruit, hepatojugular reflux or JVD.     Trachea: Trachea and phonation normal. No tracheal tenderness or tracheal deviation.     Meningeal: Brudzinski's sign and Kernig's sign absent.  Cardiovascular:     Rate and Rhythm: Normal rate and regular rhythm.     Pulses: Normal pulses.          Radial pulses are 2+ on the right side and 2+ on the left side.       Dorsalis pedis pulses are 2+ on the right side and 2+ on the left side.       Posterior tibial pulses are 2+ on the right side and 2+ on the left side.     Heart sounds: Normal heart sounds, S1 normal and S2 normal. Heart sounds not distant. No murmur heard. No friction rub. No gallop.   Pulmonary:     Effort: Pulmonary effort is normal. No tachypnea, bradypnea, accessory muscle usage or respiratory distress. She is not intubated.     Breath sounds: Normal breath sounds. No stridor. No wheezing or rales.  Chest:     Chest wall: No tenderness.  Breasts:     Right: No supraclavicular adenopathy.     Left: No supraclavicular adenopathy.    Abdominal:     General: Bowel sounds are normal. There is no distension or abdominal bruit.     Palpations: Abdomen is soft. There is no shifting dullness, fluid wave, hepatomegaly,  splenomegaly, mass or pulsatile mass.     Tenderness: There is no abdominal tenderness. There is no right CVA tenderness, left CVA tenderness, guarding or rebound.     Hernia: No hernia is present.  Genitourinary:  Comments: Deferred.  Musculoskeletal:        General: No tenderness or deformity. Normal range of motion.     Cervical back: Full passive range of motion without pain, normal range of motion and neck supple. No edema, erythema or rigidity. No spinous process tenderness or muscular tenderness. Normal range of motion.  Lymphadenopathy:     Head:     Right side of head: No submental, submandibular, tonsillar, preauricular, posterior auricular or occipital adenopathy.     Left side of head: No submental, submandibular, tonsillar, preauricular, posterior auricular or occipital adenopathy.     Cervical: No cervical adenopathy.     Right cervical: No superficial, deep or posterior cervical adenopathy.    Left cervical: No superficial, deep or posterior cervical adenopathy.     Upper Body:     Right upper body: No supraclavicular or pectoral adenopathy.     Left upper body: No supraclavicular or pectoral adenopathy.  Skin:    General: Skin is warm and dry.     Coloration: Skin is not pale.     Findings: No abrasion, bruising, burn, ecchymosis, erythema, lesion, petechiae or rash.     Nails: There is no clubbing.  Neurological:     Mental Status: She is alert and oriented to person, place, and time.     GCS: GCS eye subscore is 4. GCS verbal subscore is 5. GCS motor subscore is 6.     Cranial Nerves: No cranial nerve deficit.     Sensory: No sensory deficit.     Motor: No tremor, atrophy, abnormal muscle tone or seizure activity.     Coordination: Coordination normal.     Gait: Gait normal.     Deep Tendon Reflexes: Reflexes are normal and symmetric. Reflexes normal. Babinski sign absent on the right side. Babinski sign absent on the left side.     Reflex Scores:      Tricep  reflexes are 2+ on the right side and 2+ on the left side.      Bicep reflexes are 2+ on the right side and 2+ on the left side.      Brachioradialis reflexes are 2+ on the right side and 2+ on the left side.      Patellar reflexes are 2+ on the right side and 2+ on the left side.      Achilles reflexes are 2+ on the right side and 2+ on the left side. Psychiatric:        Mood and Affect: Mood normal.        Speech: Speech normal.        Behavior: Behavior normal.        Thought Content: Thought content normal.        Judgment: Judgment normal.     Assessment & Plan:   Problem List Items Addressed This Visit      Other   Anemia, iron deficiency   Relevant Orders   Comprehensive metabolic panel   TSH   Iron, TIBC and Ferritin Panel   CBC with Differential/Platelet   Encounter for weight loss counseling - Primary   B12 deficiency   Relevant Orders   B12   Iron, TIBC and Ferritin Panel   Intrinsic Factor Antibodies   Vitamin D insufficiency   Relevant Orders   VITAMIN D 25 Hydroxy (Vit-D Deficiency, Fractures)     Meds ordered this encounter  Medications  . phentermine 37.5 MG capsule    Sig: Take 1 capsule (37.5 mg  total) by mouth every morning.    Dispense:  30 capsule    Refill:  0  . hydrochlorothiazide (HYDRODIURIL) 12.5 MG tablet    Sig: Take 1 tablet (12.5 mg total) by mouth daily.    Dispense:  90 tablet    Refill:  0    She will start low dose HCTZ if diastolic remains elevated. Call if increasing with phentermine and stop the medication.   The patient is advised to begin progressive daily aerobic exercise program, follow a low fat, low cholesterol diet, attempt to lose weight, reduce salt in diet and cooking, improve dietary compliance, continue current medications, continue current healthy lifestyle patterns and return for routine annual checkups.  Outpatient Encounter Medications as of 10/08/2020  Medication Sig  . ALPRAZolam (XANAX) 0.5 MG tablet Take 1  tablet (0.5 mg total) by mouth 3 (three) times daily as needed for anxiety.  . hydrochlorothiazide (HYDRODIURIL) 12.5 MG tablet Take 1 tablet (12.5 mg total) by mouth daily.  Marland Kitchen ibuprofen (ADVIL) 600 MG tablet Take 1 tablet (600 mg total) by mouth every 8 (eight) hours as needed for mild pain or moderate pain.  . Iron-Vitamin C 65-125 MG TABS Take 1 tablet by mouth every morning.  . Norgestimate-Ethinyl Estradiol Triphasic (TRI-PREVIFEM) 0.18/0.215/0.25 MG-35 MCG tablet Take 1 tablet by mouth daily.  . ondansetron (ZOFRAN ODT) 4 MG disintegrating tablet Take 1 tablet (4 mg total) by mouth every 8 (eight) hours as needed for nausea or vomiting.  . phentermine 37.5 MG capsule Take 1 capsule (37.5 mg total) by mouth every morning.  . predniSONE (STERAPRED UNI-PAK 21 TAB) 10 MG (21) TBPK tablet Take by mouth daily. As directed (Patient not taking: Reported on 10/08/2020)   No facility-administered encounter medications on file as of 10/08/2020.   Return precautions given. Red Flags discussed. The patient was given clear instructions to go to ER or return to medical center if any red flags develop, symptoms do not improve, worsen or new problems develop. They verbalized understanding.    Risks, benefits, and alternatives of the medications and treatment plan prescribed today were discussed, and patient expressed understanding.    Education regarding symptom management and diagnosis given to patient on AVS.  Patient was in agreement with treatment plan.   Continue to follow with  Eula Fried. Hagen Tidd AGNP-C, FNP-C for routine health maintenance.   Eula Fried. Iam Lipson AGNP-C, FNP-C Follow-up: Return in about 1 month (around 11/08/2020), or if symptoms worsen or fail to improve, for at any time for any worsening symptoms, Go to Emergency room/ urgent care if worse.   Jairo Ben, FNP

## 2020-10-08 NOTE — Patient Instructions (Addendum)
Breast Self-Awareness Breast self-awareness is knowing how your breasts look and feel. Doing breast self-awareness is important. It allows you to catch a breast problem early while it is still small and can be treated. All women should do breast self-awareness, including women who have had breast implants. Tell your doctor if you notice a change in your breasts. What you need:  A mirror.  A well-lit room. How to do a breast self-exam A breast self-exam is one way to learn what is normal for your breasts and to check for changes. To do a breast self-exam: Look for changes 1. Take off all the clothes above your waist. 2. Stand in front of a mirror in a room with good lighting. 3. Put your hands on your hips. 4. Push your hands down. 5. Look at your breasts and nipples in the mirror to see if one breast or nipple looks different from the other. Check to see if: ? The shape of one breast is different. ? The size of one breast is different. ? There are wrinkles, dips, and bumps in one breast and not the other. 6. Look at each breast for changes in the skin, such as: ? Redness. ? Scaly areas. 7. Look for changes in your nipples, such as: ? Liquid around the nipples. ? Bleeding. ? Dimpling. ? Redness. ? A change in where the nipples are.   Feel for changes 1. Lie on your back on the floor. 2. Feel each breast. To do this, follow these steps: ? Pick a breast to feel. ? Put the arm closest to that breast above your head. ? Use your other arm to feel the nipple area of your breast. Feel the area with the pads of your three middle fingers by making small circles with your fingers. For the first circle, press lightly. For the second circle, press harder. For the third circle, press even harder. ? Keep making circles with your fingers at the different pressures as you move down your breast. Stop when you feel your ribs. ? Move your fingers a little toward the center of your body. ? Start  making circles with your fingers again, this time going up until you reach your collarbone. ? Keep making up-and-down circles until you reach your armpit. Remember to keep using the three pressures. ? Feel the other breast in the same way. 3. Sit or stand in the tub or shower. 4. With soapy water on your skin, feel each breast the same way you did in step 2 when you were lying on the floor.   Write down what you find Writing down what you find can help you remember what to tell your doctor. Write down:  What is normal for each breast.  Any changes you find in each breast, including: ? The kind of changes you find. ? Whether you have pain. ? Size and location of any lumps.  When you last had your menstrual period. General tips  Check your breasts every month.  If you are breastfeeding, the best time to check your breasts is after you feed your baby or after you use a breast pump.  If you get menstrual periods, the best time to check your breasts is 5-7 days after your menstrual period is over.  With time, you will become comfortable with the self-exam, and you will begin to know if there are changes in your breasts. Contact a doctor if you:  See a change in the shape or size  of your breasts or nipples.  See a change in the skin of your breast or nipples, such as red or scaly skin.  Have fluid coming from your nipples that is not normal.  Find a lump or thick area that was not there before.  Have pain in your breasts.  Have any concerns about your breast health. Summary  Breast self-awareness includes looking for changes in your breasts, as well as feeling for changes within your breasts.  Breast self-awareness should be done in front of a mirror in a well-lit room.  You should check your breasts every month. If you get menstrual periods, the best time to check your breasts is 5-7 days after your menstrual period is over.  Let your doctor know of any changes you see in  your breasts, including changes in size, changes on the skin, pain or tenderness, or fluid from your nipples that is not normal. This information is not intended to replace advice given to you by your health care provider. Make sure you discuss any questions you have with your health care provider. Document Revised: 01/04/2018 Document Reviewed: 01/04/2018 Elsevier Patient Education  2021 Elsevier Inc. Hydrochlorothiazide Capsules or Tablets What is this medicine? HYDROCHLOROTHIAZIDE (hye droe klor oh THYE a zide) is a diuretic. It helps you make more urine and to lose salt and excess water from your body. It treats swelling from heart, kidney, or liver disease. It also treats high blood pressure. This medicine may be used for other purposes; ask your health care provider or pharmacist if you have questions. COMMON BRAND NAME(S): Esidrix, Ezide, HydroDIURIL, Microzide, Oretic, Zide What should I tell my health care provider before I take this medicine? They need to know if you have any of these conditions:  diabetes  gout  kidney disease  liver disease  lupus  pancreatitis  an unusual or allergic reaction to hydrochlorothiazide, sulfa drugs, other medicines, foods, dyes, or preservatives  pregnant or trying to get pregnant  breast-feeding How should I use this medicine? Take this medicine by mouth. Take it as directed on the prescription label at the same time every day. You can take it with or without food. If it upsets your stomach, take it with food. Keep taking it unless your health care provider tells you to stop. Talk to your health care provider about the use of this medicine in children. While it may be prescribed for children as young as newborns for selected conditions, precautions do apply. Overdosage: If you think you have taken too much of this medicine contact a poison control center or emergency room at once. NOTE: This medicine is only for you. Do not share this  medicine with others. What if I miss a dose? If you miss a dose, take it as soon as you can. If it is almost time for your next dose, take only that dose. Do not take double or extra doses. What may interact with this medicine?  cholestyramine  colestipol  digoxin  dofetilide  lithium  medicines for blood pressure  medicines for diabetes  medicines that relax muscles for surgery  other diuretics  steroid medicines like prednisone or cortisone This list may not describe all possible interactions. Give your health care provider a list of all the medicines, herbs, non-prescription drugs, or dietary supplements you use. Also tell them if you smoke, drink alcohol, or use illegal drugs. Some items may interact with your medicine. What should I watch for while using this medicine? Visit your  health care provider for regular check ups. Check your blood pressure as directed. Ask your health care provider what your blood pressure should be. Also, find out when you should contact him or her. Do not treat yourself for coughs, colds, or pain while you are using this medicine without asking your health care provider for advice. Some medicines may increase your blood pressure. You may get drowsy or dizzy. Do not drive, use machinery, or do anything that needs mental alertness until you know how this medicine affects you. Do not stand or sit up quickly, especially if you are an older patient. This reduces the risk of dizzy or fainting spells. Alcohol can make you more drowsy and dizzy. Avoid alcoholic drinks. Talk to your health care professional about your risk of skin cancer. You may be more at risk for skin cancer if you take this medicine. This medicine can make you more sensitive to the sun. Keep out of the sun. If you cannot avoid being in the sun, wear protective clothing and use sunscreen. Do not use sun lamps or tanning beds/booths. You may need to be on a special diet while taking this  medicine. Ask your health care provider. Also, find out how many glasses of fluids you need to drink each day. Check with your health care provider if you get an attack of severe diarrhea, nausea and vomiting, or if you sweat a lot. The loss of too much body fluid can make it dangerous for you to take this medicine. This medicine may increase blood sugar. Ask your healthcare provider if changes in diet or medicines are needed if you have diabetes. What side effects may I notice from receiving this medicine? Side effects that you should report to your doctor or health care professional as soon as possible:  allergic reactions (skin rash, itching or hives; swelling of the face, lips, or tongue)  gout (severe pain, redness, or swelling in joints like the big toe)  high blood sugar (increased hunger, thirst or urination; unusually weak or tired; blurry vision)  kidney injury (trouble passing urine or change in the amount of urine)  low blood pressure (dizziness; feeling faint or lightheaded, falls; unusually weak or tired)  low potassium levels (trouble breathing; chest pain; dizziness; fast, irregular heartbeat; feeling faint or lightheaded, falls; muscle cramps or pain)  sudden change in vision or eye pain Side effects that usually do not require medical attention (report to your doctor or health care professional if they continue or are bothersome):  change in sex drive or performance  dry mouth  headache  stomach upset This list may not describe all possible side effects. Call your doctor for medical advice about side effects. You may report side effects to FDA at 1-800-FDA-1088. Where should I keep my medicine? Keep out of the reach of children and pets. Store at room temperature between 20 and 25 degrees C (68 and 77 degrees F). Protect from light and moisture. Keep the container tightly closed. Do not freeze. Get rid of any unused medicine after the expiration date. To get rid of  medicines that are no longer needed or have expired:  Take the medicine to a medicine take-back program. Check with your pharmacy or law enforcement to find a location.  If you cannot return the medicine, check the label or package insert to see if the medicine should be thrown out in the garbage or flushed down the toilet. If you are not sure, ask your health care provider.  If it is safe to put in the trash, empty the medicine out of the container. Mix the medicine with cat litter, dirt, coffee grounds, or other unwanted substance. Seal the mixture in a bag or container. Put it in the trash. NOTE: This sheet is a summary. It may not cover all possible information. If you have questions about this medicine, talk to your doctor, pharmacist, or health care provider.  2021 Elsevier/Gold Standard (2020-03-27 17:16:00) Health Maintenance, Female Adopting a healthy lifestyle and getting preventive care are important in promoting health and wellness. Ask your health care provider about:  The right schedule for you to have regular tests and exams.  Things you can do on your own to prevent diseases and keep yourself healthy. What should I know about diet, weight, and exercise? Eat a healthy diet  Eat a diet that includes plenty of vegetables, fruits, low-fat dairy products, and lean protein.  Do not eat a lot of foods that are high in solid fats, added sugars, or sodium.   Maintain a healthy weight Body mass index (BMI) is used to identify weight problems. It estimates body fat based on height and weight. Your health care provider can help determine your BMI and help you achieve or maintain a healthy weight. Get regular exercise Get regular exercise. This is one of the most important things you can do for your health. Most adults should:  Exercise for at least 150 minutes each week. The exercise should increase your heart rate and make you sweat (moderate-intensity exercise).  Do strengthening  exercises at least twice a week. This is in addition to the moderate-intensity exercise.  Spend less time sitting. Even light physical activity can be beneficial. Watch cholesterol and blood lipids Have your blood tested for lipids and cholesterol at 29 years of age, then have this test every 5 years. Have your cholesterol levels checked more often if:  Your lipid or cholesterol levels are high.  You are older than 29 years of age.  You are at high risk for heart disease. What should I know about cancer screening? Depending on your health history and family history, you may need to have cancer screening at various ages. This may include screening for:  Breast cancer.  Cervical cancer.  Colorectal cancer.  Skin cancer.  Lung cancer. What should I know about heart disease, diabetes, and high blood pressure? Blood pressure and heart disease  High blood pressure causes heart disease and increases the risk of stroke. This is more likely to develop in people who have high blood pressure readings, are of African descent, or are overweight.  Have your blood pressure checked: ? Every 3-5 years if you are 55-63 years of age. ? Every year if you are 13 years old or older. Diabetes Have regular diabetes screenings. This checks your fasting blood sugar level. Have the screening done:  Once every three years after age 22 if you are at a normal weight and have a low risk for diabetes.  More often and at a younger age if you are overweight or have a high risk for diabetes. What should I know about preventing infection? Hepatitis B If you have a higher risk for hepatitis B, you should be screened for this virus. Talk with your health care provider to find out if you are at risk for hepatitis B infection. Hepatitis C Testing is recommended for:  Everyone born from [birth year] through 1964.  Anyone with known risk factors for hepatitis C. Sexually transmitted  infections (STIs)  Get screened  for STIs, including gonorrhea and chlamydia, if: ? You are sexually active and are younger than 29 years of age. ? You are older than 29 years of age and your health care provider tells you that you are at risk for this type of infection. ? Your sexual activity has changed since you were last screened, and you are at increased risk for chlamydia or gonorrhea. Ask your health care provider if you are at risk.  Ask your health care provider about whether you are at high risk for HIV. Your health care provider may recommend a prescription medicine to help prevent HIV infection. If you choose to take medicine to prevent HIV, you should first get tested for HIV. You should then be tested every 3 months for as long as you are taking the medicine. Pregnancy  If you are about to stop having your period (premenopausal) and you may become pregnant, seek counseling before you get pregnant.  Take 400 to 800 micrograms (mcg) of folic acid every day if you become pregnant.  Ask for birth control (contraception) if you want to prevent pregnancy. Osteoporosis and menopause Osteoporosis is a disease in which the bones lose minerals and strength with aging. This can result in bone fractures. If you are 57 years old or older, or if you are at risk for osteoporosis and fractures, ask your health care provider if you should:  Be screened for bone loss.  Take a calcium or vitamin D supplement to lower your risk of fractures.  Be given hormone replacement therapy (HRT) to treat symptoms of menopause. Follow these instructions at home: Lifestyle  Do not use any products that contain nicotine or tobacco, such as cigarettes, e-cigarettes, and chewing tobacco. If you need help quitting, ask your health care provider.  Do not use street drugs.  Do not share needles.  Ask your health care provider for help if you need support or information about quitting drugs. Alcohol use  Do not drink alcohol if: ? Your  health care provider tells you not to drink. ? You are pregnant, may be pregnant, or are planning to become pregnant.  If you drink alcohol: ? Limit how much you use to 0-1 drink a day. ? Limit intake if you are breastfeeding.  Be aware of how much alcohol is in your drink. In the [place] one drink equals one 12 oz bottle of beer (355 mL), one 5 oz glass of wine (148 mL), or one 1 oz glass of hard liquor (44 mL). General instructions  Schedule regular health, dental, and eye exams.  Stay current with your vaccines.  Tell your health care provider if: ? You often feel depressed. ? You have ever been abused or do not feel safe at home. Summary  Adopting a healthy lifestyle and getting preventive care are important in promoting health and wellness.  Follow your health care provider's instructions about healthy diet, exercising, and getting tested or screened for diseases.  Follow your health care provider's instructions on monitoring your cholesterol and blood pressure. This information is not intended to replace advice given to you by your health care provider. Make sure you discuss any questions you have with your health care provider. Document Revised: 05/11/2018 Document Reviewed: 05/11/2018 Elsevier Patient Education  2021 Elsevier Inc. Cooking With Less [employer] with less salt is one way to reduce the amount of sodium you get from food. Sodium is one of the elements that make up salt.  It is found naturally in foods and is also added to certain foods. Depending on your condition and overall health, your health care provider or dietitian may recommend that you reduce your sodium intake. Most people should have less than 2,300 milligrams (mg) of sodium each day. If you have high blood pressure (hypertension), you may need to limit your sodium to 1,500 mg each day. Follow the tips below to help reduce your sodium intake. What are tips for eating less sodium? Reading food  labels  Check the food label before buying or using packaged ingredients. Always check the label for the serving size and sodium content.  Look for products with no more than 140 mg of sodium in one serving.  Check the % Daily Value column to see what percent of the daily recommended amount of sodium is provided in one serving of the product. Foods with 5% or less in this column are considered low in sodium. Foods with 20% or higher are considered high in sodium.  Do not choose foods with salt as one of the first three ingredients on the ingredients list. If salt is one of the first three ingredients, it usually means the item is high in sodium.   Shopping  Buy sodium-free or low-sodium products. Look for the following words on food labels: ? Low-sodium. ? Sodium-free. ? Reduced-sodium. ? No salt added. ? Unsalted.  Always check the sodium content even if foods are labeled as low-sodium or no salt added.  Buy fresh foods. Cooking  Use herbs, seasonings without salt, and spices as substitutes for salt.  Use sodium-free baking soda when baking.  Grill, braise, or roast foods to add flavor with less salt.  Avoid adding salt to pasta, rice, or hot cereals.  Drain and rinse canned vegetables, beans, and meat before use.  Avoid adding salt when cooking sweets and desserts.  Cook with low-sodium ingredients. What foods are high in sodium? Vegetables Regular canned vegetables (not low-sodium or reduced-sodium). Sauerkraut, pickled vegetables, and relishes. Olives. [place] fries. Onion rings. Regular canned tomato sauce and paste. Regular tomato and vegetable juice. Frozen vegetables in sauces. Grains Instant hot cereals. Bread stuffing, pancake, and biscuit mixes. Croutons. Seasoned rice or pasta mixes. Noodle soup cups. Boxed or frozen macaroni and cheese. Regular salted crackers. Self-rising flour. Rolls. Bagels. Flour tortillas and wraps. Meats and other proteins Meat or fish that  is salted, canned, smoked, cured, spiced, or pickled. This includes bacon, ham, sausages, hot dogs, corned beef, chipped beef, meat loaves, salt pork, jerky, pickled herring, anchovies, regular canned tuna, and sardines. Salted nuts. Dairy Processed cheese and cheese spreads. Cheese curds. Blue cheese. Feta cheese. String cheese. Regular cottage cheese. Buttermilk. Canned milk. The items listed above may not be a complete list of foods high in sodium. Actual amounts of sodium may be different depending on processing. Contact a dietitian for more information. What foods are low in sodium? Fruits Fresh, frozen, or canned fruit with no sauce added. Fruit juice. Vegetables Fresh or frozen vegetables with no sauce added. "No salt added" canned vegetables. "No salt added" tomato sauce and paste. Low-sodium or reduced-sodium tomato and vegetable juice. Grains Noodles, pasta, quinoa, rice. Shredded or puffed wheat or puffed rice. Regular or quick oats (not instant). Low-sodium crackers. Low-sodium bread. Whole-grain bread and whole-grain pasta. Unsalted popcorn. Meats and other proteins Fresh or frozen whole meats, poultry (not injected with sodium), and fish with no sauce added. Unsalted nuts. Dried peas, beans, and lentils without added salt.  Unsalted canned beans. Eggs. Unsalted nut butters. Low-sodium canned tuna or chicken. Dairy Milk. Soy milk. Yogurt. Low-sodium cheeses, such as Swiss, [address], [place], and [employer]. Sherbet or ice cream (keep to  cup per serving). Cream cheese. Fats and oils Unsalted butter or margarine. Other foods Homemade pudding. Sodium-free baking soda and baking powder. Herbs and spices. Low-sodium seasoning mixes. Beverages Coffee and tea. Carbonated beverages. The items listed above may not be a complete list of foods low in sodium. Actual amounts of sodium may be different depending on processing. Contact a dietitian for more information. What are some salt  alternatives when cooking? The following are herbs, seasonings, and spices that can be used instead of salt to flavor your food. Herbs should be fresh or dried. Do not choose packaged mixes. Next to the name of the herb, spice, or seasoning are some examples of foods you can pair it with. Herbs  Bay leaves - Soups, meat and vegetable dishes, and spaghetti sauce.  Basil - [employer], soups, pasta, and fish dishes.  Cilantro - Meat, poultry, and vegetable dishes.  Chili powder - Marinades and Mexican dishes.  Chives - Salad dressings and potato dishes.  Cumin - Mexican dishes, couscous, and meat dishes.  Dill - Fish dishes, sauces, and salads.  Fennel - Meat and vegetable dishes, breads, and cookies.  Garlic (do not use garlic salt) - [place] dishes, meat dishes, salad dressings, and sauces.  Marjoram - Soups, potato dishes, and meat dishes.  Oregano - Pizza and spaghetti sauce.  Parsley - Salads, soups, pasta, and meat dishes.  Rosemary - [place] dishes, salad dressings, soups, and red meats.  Saffron - Fish dishes, pasta, and some poultry dishes.  Sage - Stuffings and sauces.  Tarragon - Fish and [employer].  Thyme - Stuffing, meat, and fish dishes. Seasonings  Lemon juice - Fish dishes, poultry dishes, vegetables, and salads.  Vinegar - Salad dressings, vegetables, and fish dishes. Spices  Cinnamon - Sweet dishes, such as cakes, cookies, and puddings.  Cloves - Gingerbread, puddings, and marinades for meats.  Curry - Vegetable dishes, fish and poultry dishes, and stir-fry dishes.  Ginger - Vegetable dishes, fish dishes, and stir-fry dishes.  Nutmeg - Pasta, vegetables, poultry, fish dishes, and custard. Summary  Cooking with less salt is one way to reduce the amount of sodium that you get from food.  Buy sodium-free or low-sodium products.  Check the food label before using or buying packaged ingredients.  Use herbs, seasonings without salt, and  spices as substitutes for salt in foods. This information is not intended to replace advice given to you by your health care provider. Make sure you discuss any questions you have with your health care provider. Document Revised: 05/10/2019 Document Reviewed: 05/10/2019 Elsevier Patient Education  2021 Elsevier Inc. Phentermine tablets or capsules What is this medicine? PHENTERMINE (FEN ter meen) decreases your appetite. It is used with a reduced calorie diet and exercise to help you lose weight. This medicine may be used for other purposes; ask your health care provider or pharmacist if you have questions. COMMON BRAND NAME(S): Adipex-P, Atti-Plex P, Atti-Plex P Spansule, Fastin, Lomaira, Pro-Fast, Tara-8 What should I tell my health care provider before I take this medicine? They need to know if you have any of these conditions:  agitation or nervousness  diabetes  glaucoma  heart disease  high blood pressure  history of drug abuse or addiction  history of stroke  kidney disease  lung disease called  Primary Pulmonary Hypertension (PPH)  taken an MAOI like Carbex, Eldepryl, Marplan, Nardil, or Parnate in last 14 days  taking stimulant medicines for attention disorders, weight loss, or to stay awake  thyroid disease  an unusual or allergic reaction to phentermine, other medicines, foods, dyes, or preservatives  pregnant or trying to get pregnant  breast-feeding How should I use this medicine? Take this medicine by mouth with a glass of water. Follow the directions on the prescription label. Take your medicine at regular intervals. Do not take it more often than directed. Do not stop taking except on your doctor's advice. Talk to your pediatrician regarding the use of this medicine in children. While this drug may be prescribed for children 17 years or older for selected conditions, precautions do apply. Overdosage: If you think you have taken too much of this medicine  contact a poison control center or emergency room at once. NOTE: This medicine is only for you. Do not share this medicine with others. What if I miss a dose? If you miss a dose, take it as soon as you can. If it is almost time for your next dose, take only that dose. Do not take double or extra doses. What may interact with this medicine? Do not take this medicine with any of the following medications:  MAOIs like Carbex, Eldepryl, Marplan, Nardil, and Parnate This medicine may also interact with the following medications:  alcohol  certain medicines for depression, anxiety, or psychotic disorders  certain medicines for high blood pressure  linezolid  medicines for colds or breathing difficulties like pseudoephedrine or phenylephrine  medicines for diabetes  sibutramine  stimulant medicines for attention disorders, weight loss, or to stay awake This list may not describe all possible interactions. Give your health care provider a list of all the medicines, herbs, non-prescription drugs, or dietary supplements you use. Also tell them if you smoke, drink alcohol, or use illegal drugs. Some items may interact with your medicine. What should I watch for while using this medicine? Visit your doctor or health care provider for regular checks on your progress. Do not stop taking except on your health care provider's advice. You may develop a severe reaction. Your health care provider will tell you how much medicine to take. Do not take this medicine close to bedtime. It may prevent you from sleeping. You may get drowsy or dizzy. Do not drive, use machinery, or do anything that needs mental alertness until you know how this medicine affects you. Do not stand or sit up quickly, especially if you are an older patient. This reduces the risk of dizzy or fainting spells. Alcohol may increase dizziness and drowsiness. Avoid alcoholic drinks. This medicine may affect blood sugar levels. Ask your  healthcare provider if changes in diet or medicines are needed if you have diabetes. Women should inform their health care provider if they wish to become pregnant or think they might be pregnant. Losing weight while pregnant is not advised and may cause harm to the unborn child. Talk to your health care provider for more information. What side effects may I notice from receiving this medicine? Side effects that you should report to your doctor or health care professional as soon as possible:  allergic reactions like skin rash, itching or hives, swelling of the face, lips, or tongue  breathing problems  changes in emotions or moods  changes in vision  chest pain or chest tightness  fast, irregular heartbeat  feeling faint or lightheaded  increased blood pressure  irritable  restlessness  tremors  seizures  signs and symptoms of a stroke like changes in vision; confusion; trouble speaking or understanding; severe headaches; sudden numbness or weakness of the face, arm or leg; trouble walking; dizziness; loss of balance or coordination  unusually weak or tired Side effects that usually do not require medical attention (report to your doctor or health care professional if they continue or are bothersome):  changes in taste  constipation or diarrhea  dizziness  dry mouth  headache  trouble sleeping  upset stomach This list may not describe all possible side effects. Call your doctor for medical advice about side effects. You may report side effects to FDA at 1-800-FDA-1088. Where should I keep my medicine? Keep out of the reach of children. This medicine can be abused. Keep your medicine in a safe place to protect it from theft. Do not share this medicine with anyone. Selling or giving away this medicine is dangerous and against the law. This medicine may cause harm and death if it is taken by other adults, children, or pets. Return medicine that has not been used to an  official disposal site. Contact the DEA at (858)[phone] or your city/county government to find a site. If you cannot return the medicine, mix any unused medicine with a substance like cat litter or coffee grounds. Then throw the medicine away in a sealed container like a sealed bag or coffee can with a lid. Do not use the medicine after the expiration date. Store at room temperature between 20 and 25 degrees C (68 and 77 degrees F). Keep container tightly closed. NOTE: This sheet is a summary. It may not cover all possible information. If you have questions about this medicine, talk to your doctor, pharmacist, or health care provider.  2021 Elsevier/Gold Standard (2019-03-24 12:54:20)

## 2020-10-09 ENCOUNTER — Other Ambulatory Visit: Payer: Self-pay | Admitting: Adult Health

## 2020-10-09 DIAGNOSIS — E538 Deficiency of other specified B group vitamins: Secondary | ICD-10-CM

## 2020-10-09 DIAGNOSIS — D509 Iron deficiency anemia, unspecified: Secondary | ICD-10-CM

## 2020-10-09 DIAGNOSIS — E559 Vitamin D deficiency, unspecified: Secondary | ICD-10-CM

## 2020-10-09 MED ORDER — B-12 1000 MCG SL SUBL: 2.0000 | SUBLINGUAL_TABLET | Freq: Every day | SUBLINGUAL | 0 refills | Status: DC

## 2020-10-09 MED ORDER — VITAMIN D (ERGOCALCIFEROL) 1.25 MG (50000 UNIT) PO CAPS: 50000.0000 [IU] | ORAL_CAPSULE | ORAL | 0 refills | Status: DC

## 2020-10-09 MED ORDER — IRON (FERROUS SULFATE) 325 (65 FE) MG PO TABS: 1.0000 | ORAL_TABLET | Freq: Every day | ORAL | 0 refills | Status: DC

## 2020-10-09 NOTE — Progress Notes (Signed)
Iron saturation  level still low. Will send in iron to take as directed, may use colace to avoid constipation per package and or add increased fiber foods. Taking with vitamin C beverage or supplement helps with absorption of iron.   B 12 is low recommend Vitamin B12 sublingual 2,000 mcg under the tongue daily. Intrinsic factor still pending and will result once received- this tells if she can absorb b12 orally. Sent B12 and iron to pharmacy.   CMP is within normal limits.TSH for thyroid within normal limits.  CBC mild low hemoglobin expect from anemia and should improve with iron and B12. Verify no heavy bleeding or rectal bleeding ?   Vitamin  D is low, this can contribute to poor sleep and fatigue, will send in prescription for Vitamin D at 50,000 units by mouth once every 7 days/(once weekly) for 12 weeks. Advise recheck lab Vitamin D in 1-2 weeks after completing vitamin d prescription. Labs need to be scheduled.   Recheck Vitamin D, CBC, and B12 in 3 months schedule those labs- orders have been placed.

## 2020-10-09 NOTE — Progress Notes (Signed)
Orders Placed This Encounter  Procedures  . VITAMIN D 25 Hydroxy (Vit-D Deficiency, Fractures)  . B12  . CBC with Differential/Platelet   Meds ordered this encounter  Medications  . Cyanocobalamin (B-12) 1000 MCG SUBL    Sig: Place 2 tablets under the tongue daily.    Dispense:  90 tablet    Refill:  0  . Vitamin D, Ergocalciferol, (DRISDOL) 1.25 MG (50000 UNIT) CAPS capsule    Sig: Take 1 capsule (50,000 Units total) by mouth every 7 (seven) days. (taking one tablet per week) scheduled Vitamin D lab in  1-2 weeks after completing prescription.    Dispense:  12 capsule    Refill:  0  . Iron, Ferrous Sulfate, 325 (65 Fe) MG TABS    Sig: Take 1 tablet by mouth daily.    Dispense:  90 tablet    Refill:  0

## 2020-10-10 LAB — IRON,TIBC AND FERRITIN PANEL
%SAT: 10 % (calc) — ABNORMAL LOW (ref 16–45)
Ferritin: 5 ng/mL — ABNORMAL LOW (ref 16–154)
Iron: 54 ug/dL (ref 40–190)
TIBC: 548 mcg/dL (calc) — ABNORMAL HIGH (ref 250–450)

## 2020-10-10 LAB — INTRINSIC FACTOR ANTIBODIES: Intrinsic Factor: NEGATIVE

## 2020-10-29 ENCOUNTER — Encounter: Payer: Self-pay | Admitting: Adult Health

## 2020-10-30 ENCOUNTER — Other Ambulatory Visit: Payer: Self-pay | Admitting: Adult Health

## 2020-11-12 ENCOUNTER — Encounter: Payer: Self-pay | Admitting: Adult Health

## 2020-11-12 ENCOUNTER — Ambulatory Visit: Payer: 59 | Admitting: Adult Health

## 2020-11-12 ENCOUNTER — Other Ambulatory Visit: Payer: Self-pay

## 2020-11-12 VITALS — BP 130/88 | HR 105 | Temp 98.2°F | Ht 64.17 in | Wt 195.2 lb

## 2020-11-12 DIAGNOSIS — Z713 Dietary counseling and surveillance: Secondary | ICD-10-CM | POA: Diagnosis not present

## 2020-11-12 DIAGNOSIS — D509 Iron deficiency anemia, unspecified: Secondary | ICD-10-CM

## 2020-11-12 MED ORDER — PHENTERMINE HCL 37.5 MG PO CAPS: 37.5000 mg | ORAL_CAPSULE | ORAL | 0 refills | Status: DC

## 2020-11-12 NOTE — Patient Instructions (Signed)
Calorie Counting for Weight Loss Calories are units of energy. Your body needs a certain number of calories from food to keep going throughout the day. When you eat or drink more calories than your body needs, your body stores the extra calories mostly as fat. When you eat or drink fewer calories than your body needs, your body burns fat to getthe energy it needs. Calorie counting means keeping track of how many calories you eat and drink each day. Calorie counting can be helpful if you need to lose weight. If you eat fewer calories than your body needs, you should lose weight. Ask yourhealth care provider what a healthy weight is for you. For calorie counting to work, you will need to eat the right number of calories each day to lose a healthy amount of weight per week. A dietitian can help you figure out how many calories you need in a day and will suggest ways to reach your calorie goal. A healthy amount of weight to lose each week is usually 1-2 lb (0.5-0.9 kg). This usually means that your daily calorie intake should be reduced by 500-750 calories. Eating 1,200-1,500 calories a day can help most women lose weight. Eating 1,500-1,800 calories a day can help most men lose weight. What do I need to know about calorie counting? Work with your health care provider or dietitian to determine how many calories you should get each day. To meet your daily calorie goal, you will need to: Find out how many calories are in each food that you would like to eat. Try to do this before you eat. Decide how much of the food you plan to eat. Keep a food log. Do this by writing down what you ate and how many calories it had. To successfully lose weight, it is important to balance calorie counting with ahealthy lifestyle that includes regular activity. Where do I find calorie information?  The number of calories in a food can be found on a Nutrition Facts label. If a food does not have a Nutrition Facts label, try to  look up the calories onlineor ask your dietitian for help. Remember that calories are listed per serving. If you choose to have more than one serving of a food, you will have to multiply the calories per serving by the number of servings you plan to eat. For example, the label on a package of bread might say that a serving size is 1 slice and that there are 90 calories in a serving. If you eat 1 slice, you will have eaten 90 calories. If you eat 2slices, you will have eaten 180 calories. How do I keep a food log? After each time that you eat, record the following in your food log as soon as possible: What you ate. Be sure to include toppings, sauces, and other extras on the food. How much you ate. This can be measured in cups, ounces, or number of items. How many calories were in each food and drink. The total number of calories in the food you ate. Keep your food log near you, such as in a pocket-sized notebook or on an app or website on your mobile phone. Some programs will calculate calories for you andshow you how many calories you have left to meet your daily goal. What are some portion-control tips? Know how many calories are in a serving. This will help you know how many servings you can have of a certain food. Use a measuring cup to   measure serving sizes. You could also try weighing out portions on a kitchen scale. With time, you will be able to estimate serving sizes for some foods. Take time to put servings of different foods on your favorite plates or in your favorite bowls and cups so you know what a serving looks like. Try not to eat straight from a food's packaging, such as from a bag or box. Eating straight from the package makes it hard to see how much you are eating and can lead to overeating. Put the amount you would like to eat in a cup or on a plate to make sure you are eating the right portion. Use smaller plates, glasses, and bowls for smaller portions and to prevent  overeating. Try not to multitask. For example, avoid watching TV or using your computer while eating. If it is time to eat, sit down at a table and enjoy your food. This will help you recognize when you are full. It will also help you be more mindful of what and how much you are eating. What are tips for following this plan? Reading food labels Check the calorie count compared with the serving size. The serving size may be smaller than what you are used to eating. Check the source of the calories. Try to choose foods that are high in protein, fiber, and vitamins, and low in saturated fat, trans fat, and sodium. Shopping Read nutrition labels while you shop. This will help you make healthy decisions about which foods to buy. Pay attention to nutrition labels for low-fat or fat-free foods. These foods sometimes have the same number of calories or more calories than the full-fat versions. They also often have added sugar, starch, or salt to make up for flavor that was removed with the fat. Make a grocery list of lower-calorie foods and stick to it. Cooking Try to cook your favorite foods in a healthier way. For example, try baking instead of frying. Use low-fat dairy products. Meal planning Use more fruits and vegetables. One-half of your plate should be fruits and vegetables. Include lean proteins, such as chicken, turkey, and fish. Lifestyle Each week, aim to do one of the following: 150 minutes of moderate exercise, such as walking. 75 minutes of vigorous exercise, such as running. General information Know how many calories are in the foods you eat most often. This will help you calculate calorie counts faster. Find a way of tracking calories that works for you. Get creative. Try different apps or programs if writing down calories does not work for you. What foods should I eat?  Eat nutritious foods. It is better to have a nutritious, high-calorie food, such as an avocado, than a food with  few nutrients, such as a bag of potato chips. Use your calories on foods and drinks that will fill you up and will not leave you hungry soon after eating. Examples of foods that fill you up are nuts and nut butters, vegetables, lean proteins, and high-fiber foods such as whole grains. High-fiber foods are foods with more than 5 g of fiber per serving. Pay attention to calories in drinks. Low-calorie drinks include water and unsweetened drinks. The items listed above may not be a complete list of foods and beverages you can eat. Contact a dietitian for more information. What foods should I limit? Limit foods or drinks that are not good sources of vitamins, minerals, or protein or that are high in unhealthy fats. These include: Candy. Other sweets. Sodas, specialty   coffee drinks, alcohol, and juice. The items listed above may not be a complete list of foods and beverages you should avoid. Contact a dietitian for more information. How do I count calories when eating out? Pay attention to portions. Often, portions are much larger when eating out. Try these tips to keep portions smaller: Consider sharing a meal instead of getting your own. If you get your own meal, eat only half of it. Before you start eating, ask for a container and put half of your meal into it. When available, consider ordering smaller portions from the menu instead of full portions. Pay attention to your food and drink choices. Knowing the way food is cooked and what is included with the meal can help you eat fewer calories. If calories are listed on the menu, choose the lower-calorie options. Choose dishes that include vegetables, fruits, whole grains, low-fat dairy products, and lean proteins. Choose items that are boiled, broiled, grilled, or steamed. Avoid items that are buttered, battered, fried, or served with cream sauce. Items labeled as crispy are usually fried, unless stated otherwise. Choose water, low-fat milk,  unsweetened iced tea, or other drinks without added sugar. If you want an alcoholic beverage, choose a lower-calorie option, such as a glass of wine or light beer. Ask for dressings, sauces, and syrups on the side. These are usually high in calories, so you should limit the amount you eat. If you want a salad, choose a garden salad and ask for grilled meats. Avoid extra toppings such as bacon, cheese, or fried items. Ask for the dressing on the side, or ask for olive oil and vinegar or lemon to use as dressing. Estimate how many servings of a food you are given. Knowing serving sizes will help you be aware of how much food you are eating at restaurants. Where to find more information Centers for Disease Control and Prevention: FootballExhibition.com.br [place] Department of Agriculture: WrestlingReporter.dk Summary Calorie counting means keeping track of how many calories you eat and drink each day. If you eat fewer calories than your body needs, you should lose weight. A healthy amount of weight to lose per week is usually 1-2 lb (0.5-0.9 kg). This usually means reducing your daily calorie intake by 500-750 calories. The number of calories in a food can be found on a Nutrition Facts label. If a food does not have a Nutrition Facts label, try to look up the calories online or ask your dietitian for help. Use smaller plates, glasses, and bowls for smaller portions and to prevent overeating. Use your calories on foods and drinks that will fill you up and not leave you hungry shortly after a meal. This information is not intended to replace advice given to you by your health care provider. Make sure you discuss any questions you have with your healthcare provider. Document Revised: 06/29/2019 Document Reviewed: 06/29/2019 Elsevier Patient Education  2022 Else[employer] Inc. Phentermine tablets or capsules What is this medication? PHENTERMINE (FEN ter meen) decreases your appetite. It is used with a reducedcalorie diet and exercise  to help you lose weight. This medicine may be used for other purposes; ask your health care provider orpharmacist if you have questions. COMMON BRAND NAME(S): Adipex-P, Atti-Plex P, Atti-Plex P Spansule, Fastin,Lomaira, Pro-Fast, Tara-8 What should I tell my care team before I take this medication? They need to know if you have any of these conditions: agitation or nervousness diabetes glaucoma heart disease high blood pressure history of drug abuse or addiction history of  stroke kidney disease lung disease called Primary Pulmonary Hypertension (PPH) taken an MAOI like Carbex, Eldepryl, Marplan, Nardil, or Parnate in last 14 days taking stimulant medicines for attention disorders, weight loss, or to stay awake thyroid disease an unusual or allergic reaction to phentermine, other medicines, foods, dyes, or preservatives pregnant or trying to get pregnant breast-feeding How should I use this medication? Take this medicine by mouth with a glass of water. Follow the directions on the prescription label. Take your medicine at regular intervals. Do not take itmore often than directed. Do not stop taking except on your doctor's advice. Talk to your pediatrician regarding the use of this medicine in children. While this drug may be prescribed for children 17 years or older for selectedconditions, precautions do apply. Overdosage: If you think you have taken too much of this medicine contact apoison control center or emergency room at once. NOTE: This medicine is only for you. Do not share this medicine with others. What if I miss a dose? If you miss a dose, take it as soon as you can. If it is almost time for yournext dose, take only that dose. Do not take double or extra doses. What may interact with this medication? Do not take this medicine with any of the following medications: MAOIs like Carbex, Eldepryl, Marplan, Nardil, and Parnate This medicine may also interact with the following  medications: alcohol certain medicines for depression, anxiety, or psychotic disorders certain medicines for high blood pressure linezolid medicines for colds or breathing difficulties like pseudoephedrine or phenylephrine medicines for diabetes sibutramine stimulant medicines for attention disorders, weight loss, or to stay awake This list may not describe all possible interactions. Give your health care provider a list of all the medicines, herbs, non-prescription drugs, or dietary supplements you use. Also tell them if you smoke, drink alcohol, or use illegaldrugs. Some items may interact with your medicine. What should I watch for while using this medication? Visit your doctor or health care provider for regular checks on your progress. Do not stop taking except on your health care provider's advice. You may develop a severe reaction. Your health care provider will tell you how muchmedicine to take. Do not take this medicine close to bedtime. It may prevent you from sleeping. You may get drowsy or dizzy. Do not drive, use machinery, or do anything that needs mental alertness until you know how this medicine affects you. Do not stand or sit up quickly, especially if you are an older patient. This reduces the risk of dizzy or fainting spells. Alcohol may increase dizziness anddrowsiness. Avoid alcoholic drinks. This medicine may affect blood sugar levels. Ask your healthcare provider ifchanges in diet or medicines are needed if you have diabetes. Women should inform their health care provider if they wish to become pregnant or think they might be pregnant. Losing weight while pregnant is not advised and may cause harm to the unborn child. Talk to your health care provider formore information. What side effects may I notice from receiving this medication? Side effects that you should report to your doctor or health care professionalas soon as possible: allergic reactions like skin rash, itching  or hives, swelling of the face, lips, or tongue breathing problems changes in emotions or moods changes in vision chest pain or chest tightness fast, irregular heartbeat feeling faint or lightheaded increased blood pressure irritable restlessness tremors seizures signs and symptoms of a stroke like changes in vision; confusion; trouble speaking or understanding; severe headaches; sudden numbness  or weakness of the face, arm or leg; trouble walking; dizziness; loss of balance or coordination unusually weak or tired Side effects that usually do not require medical attention (report to yourdoctor or health care professional if they continue or are bothersome): changes in taste constipation or diarrhea dizziness dry mouth headache trouble sleeping upset stomach This list may not describe all possible side effects. Call your doctor for medical advice about side effects. You may report side effects to FDA at1-800-FDA-1088. Where should I keep my medication? Keep out of the reach of children. This medicine can be abused. Keep your medicine in a safe place to protect it from theft. Do not share this medicine with anyone. Selling or giving away this medicine is dangerous and against thelaw. This medicine may cause harm and death if it is taken by other adults, children, or pets. Return medicine that has not been used to an official disposal site. Contact the DEA at 406-578-9715 or your city/county government to find a site. If you cannot return the medicine, mix any unused medicine with a substance like cat litter or coffee grounds. Then throw the medicine away in a sealed container like a sealed bag or coffee can with a lid. Do not use themedicine after the expiration date. Store at room temperature between 20 and 25 degrees C (68 and 77 degrees F).Keep container tightly closed. NOTE: This sheet is a summary. It may not cover all possible information. If you have questions about this medicine,  talk to your doctor, pharmacist, orhealth care provider.  2022 Elsevier/Gold Standard (2019-03-24 12:54:20)

## 2020-11-12 NOTE — Progress Notes (Signed)
Established Patient Office Visit  Subjective:  Patient ID: Natalie Wu, female    DOB: August 14, 1990  Age: 30 y.o. MRN: [id number]  CC:  Chief Complaint  Patient presents with   Follow-up    Pt has no concerns and states she is doing great.    HPI Natalie Wu presents for here for weight loss follow up has lost 7 lbs since last visit 10/08/20. Taking Phentermine 37.5 mg once daily without any unwanted side effects.  " Feels well".  Patient's last menstrual period was 11/11/2020.   Denies dizziness, lightheadedness, pre syncopal or syncopal episodes.   Patient  denies any fever, body aches,chills, rash, chest pain, shortness of breath, nausea, vomiting, or diarrhea.   Past Medical History:  Diagnosis Date   Complication of anesthesia    Slow to wake up   GERD (gastroesophageal reflux disease)    Iron deficiency anemia    Migraine     Past Surgical History:  Procedure Laterality Date   CHOLECYSTECTOMY N/A 04/03/2019   Procedure: LAPAROSCOPIC CHOLECYSTECTOMY;  Surgeon: Henrene Dodge, MD;  Location: [facility] ORS;  Service: General;  Laterality: N/A;   FOOT SURGERY     TONSILLECTOMY     WISDOM TOOTH EXTRACTION      Family History  Problem Relation Age of Onset   Breast cancer Maternal Uncle    Breast cancer Maternal Grandmother    Hyperlipidemia Father    Hypertension Father    Lung cancer Maternal Grandfather    Bone cancer Paternal Grandmother     Social History   Socioeconomic History   Marital status: Single    Spouse name: Not on file   Number of children: Not on file   Years of education: Not on file   Highest education level: Not on file  Occupational History   Not on file  Tobacco Use   Smoking status: Never   Smokeless tobacco: Never  Vaping Use   Vaping Use: Never used  Substance and Sexual Activity   Alcohol use: Yes    Comment: occasional   Drug use: No   Sexual activity: Not on file  Other Topics Concern   Not on file  Social History  Narrative   Single   Has twin sister, Programmer, applications in high school   Not sexually active   Wants to be physical therapist   Social Determinants of Health   Financial Resource Strain: Not on file  Food Insecurity: Not on file  Transportation Needs: Not on file  Physical Activity: Not on file  Stress: Not on file  Social Connections: Not on file  Intimate Partner Violence: Not on file    Outpatient Medications Prior to Visit  Medication Sig Dispense Refill   ALPRAZolam (XANAX) 0.5 MG tablet Take 1 tablet (0.5 mg total) by mouth 3 (three) times daily as needed for anxiety. 20 tablet 0   Cyanocobalamin (B-12) 1000 MCG SUBL Place 2 tablets under the tongue daily. 90 tablet 0   ibuprofen (ADVIL) 600 MG tablet Take 1 tablet (600 mg total) by mouth every 8 (eight) hours as needed for mild pain or moderate pain. 30 tablet 0   Iron, Ferrous Sulfate, 325 (65 Fe) MG TABS Take 1 tablet by mouth daily. 90 tablet 0   Norgestimate-Ethinyl Estradiol Triphasic (TRI-PREVIFEM) 0.18/0.215/0.25 MG-35 MCG tablet Take 1 tablet by mouth daily. 84 tablet 0   ondansetron (ZOFRAN ODT) 4 MG disintegrating tablet Take 1 tablet (4 mg total) by mouth every 8 (eight) hours  as needed for nausea or vomiting. 20 tablet 0   Vitamin D, Ergocalciferol, (DRISDOL) 1.25 MG (50000 UNIT) CAPS capsule Take 1 capsule (50,000 Units total) by mouth every 7 (seven) days. (taking one tablet per week) scheduled Vitamin D lab in  1-2 weeks after completing prescription. 12 capsule 0   phentermine 37.5 MG capsule Take 1 capsule (37.5 mg total) by mouth every morning. 30 capsule 0   hydrochlorothiazide (HYDRODIURIL) 12.5 MG tablet Take 1 tablet (12.5 mg total) by mouth daily. (Patient not taking: Reported on 11/12/2020) 90 tablet 0   No facility-administered medications prior to visit.    No Known Allergies  ROS Review of Systems  Constitutional: Negative.   HENT: Negative.    Respiratory: Negative.    Cardiovascular: Negative.    Gastrointestinal: Negative.   Genitourinary: Negative.   Musculoskeletal: Negative.   Neurological: Negative.   Hematological: Negative.      Objective:    Physical Exam Constitutional:      General: She is not in acute distress.    Appearance: Normal appearance. She is obese. She is not ill-appearing, toxic-appearing or diaphoretic.     Comments: Patient is alert and oriented and responsive to questions Engages in eye contact with provider. Speaks in full sentences without any pauses without any shortness of breath or distress.     HENT:     Head: Normocephalic and atraumatic.     Right Ear: External ear normal.     Left Ear: External ear normal.     Nose: Nose normal.     Mouth/Throat:     Pharynx: Oropharynx is clear.  Cardiovascular:     Rate and Rhythm: Normal rate and regular rhythm.     Pulses: Normal pulses.     Heart sounds: Normal heart sounds. No murmur heard.   No friction rub. No gallop.  Pulmonary:     Effort: Pulmonary effort is normal. No respiratory distress.     Breath sounds: Normal breath sounds. No stridor. No wheezing, rhonchi or rales.  Chest:     Chest wall: No tenderness.  Abdominal:     Palpations: Abdomen is soft.  Musculoskeletal:        General: Normal range of motion.  Skin:    General: Skin is warm.     Findings: No rash.  Neurological:     Mental Status: She is oriented to person, place, and time.     Gait: Gait normal.    BP 130/88   Pulse (!) 105   Temp 98.2 F (36.8 C)   Ht 5' 4.17" (1.63 m)   Wt 195 lb 3.2 oz (88.5 kg)   LMP 11/11/2020   SpO2 98%   BMI 33.33 kg/m  Wt Readings from Last 3 Encounters:  11/12/20 195 lb 3.2 oz (88.5 kg)  10/08/20 202 lb 6.4 oz (91.8 kg)  11/16/19 185 lb (83.9 kg)     Health Maintenance Due  Topic Date Due   COVID-19 Vaccine (1) Never done   Hepatitis C Screening  Never done    There are no preventive care reminders to display for this patient.  Lab Results  Component Value Date    TSH 2.52 10/08/2020   Lab Results  Component Value Date   WBC 6.4 10/08/2020   HGB 11.9 (L) 10/08/2020   HCT 36.1 10/08/2020   MCV 81.9 10/08/2020   PLT 313.0 10/08/2020   Lab Results  Component Value Date   NA 137 10/08/2020   K 3.7  10/08/2020   CO2 26 10/08/2020   GLUCOSE 76 10/08/2020   BUN 14 10/08/2020   CREATININE 0.72 10/08/2020   BILITOT 0.4 10/08/2020   ALKPHOS 82 10/08/2020   AST 16 10/08/2020   ALT 17 10/08/2020   PROT 6.6 10/08/2020   ALBUMIN 3.9 10/08/2020   CALCIUM 9.0 10/08/2020   GFR 113.75 10/08/2020   Lab Results  Component Value Date   CHOL 227 (H) 04/19/2014   Lab Results  Component Value Date   HDL 68.70 04/19/2014   Lab Results  Component Value Date   LDLCALC 126 (H) 04/19/2014   Lab Results  Component Value Date   TRIG 161.0 (H) 04/19/2014   Lab Results  Component Value Date   CHOLHDL 3 04/19/2014   No results found for: HGBA1C    Assessment & Plan:   Problem List Items Addressed This Visit       Other   Iron deficiency anemia   Relevant Orders   Iron, TIBC and Ferritin Panel   Weight loss counseling, encounter for - Primary   Relevant Medications   phentermine 37.5 MG capsule      Meds ordered this encounter  Medications   phentermine 37.5 MG capsule    Sig: Take 1 capsule (37.5 mg total) by mouth every morning.    Dispense:  30 capsule    Refill:  0   She is aware of side effects profile of medication.Will continue for 2  months and reevaluation. She will call if any concerns and is aware when to discontinue medication and seek care if needed.  Red Flags discussed. The patient was given clear instructions to go to ER or return to medical center if any red flags develop, symptoms do not improve, worsen or new problems develop. They verbalized understanding.  She is on b12 and iron and has return labs set  for 3 month recheck from 10/08/2020 office visit. She is aware.  Follow-up: Return in about 2 months (around  01/12/2021), or if symptoms worsen or fail to improve, for at any time for any worsening symptoms, Go to Emergency room/ urgent care if worse.    Jairo Ben, FNP

## 2020-11-23 DIAGNOSIS — N390 Urinary tract infection, site not specified: Secondary | ICD-10-CM | POA: Diagnosis not present

## 2020-11-23 DIAGNOSIS — A499 Bacterial infection, unspecified: Secondary | ICD-10-CM | POA: Diagnosis not present

## 2020-11-23 DIAGNOSIS — M545 Low back pain, unspecified: Secondary | ICD-10-CM | POA: Diagnosis not present

## 2020-11-28 ENCOUNTER — Other Ambulatory Visit: Payer: Self-pay | Admitting: Internal Medicine

## 2020-11-28 ENCOUNTER — Encounter: Payer: Self-pay | Admitting: Adult Health

## 2020-11-28 MED ORDER — NORGESTIM-ETH ESTRAD TRIPHASIC 0.18/0.215/0.25 MG-35 MCG PO TABS: 1.0000 | ORAL_TABLET | Freq: Every day | ORAL | 3 refills | Status: DC

## 2020-12-03 DIAGNOSIS — M545 Low back pain, unspecified: Secondary | ICD-10-CM | POA: Diagnosis not present

## 2020-12-13 ENCOUNTER — Encounter: Payer: Self-pay | Admitting: Adult Health

## 2020-12-27 ENCOUNTER — Ambulatory Visit: Payer: 59 | Admitting: Internal Medicine

## 2020-12-27 ENCOUNTER — Encounter: Payer: Self-pay | Admitting: Internal Medicine

## 2020-12-27 ENCOUNTER — Other Ambulatory Visit: Payer: Self-pay

## 2020-12-27 VITALS — BP 128/86 | HR 94 | Temp 97.8°F | Ht 64.17 in | Wt 194.8 lb

## 2020-12-27 DIAGNOSIS — Z713 Dietary counseling and surveillance: Secondary | ICD-10-CM

## 2020-12-27 DIAGNOSIS — D509 Iron deficiency anemia, unspecified: Secondary | ICD-10-CM

## 2020-12-27 DIAGNOSIS — E669 Obesity, unspecified: Secondary | ICD-10-CM | POA: Diagnosis not present

## 2020-12-27 DIAGNOSIS — E559 Vitamin D deficiency, unspecified: Secondary | ICD-10-CM | POA: Diagnosis not present

## 2020-12-27 DIAGNOSIS — R87610 Atypical squamous cells of undetermined significance on cytologic smear of cervix (ASC-US): Secondary | ICD-10-CM | POA: Diagnosis not present

## 2020-12-27 DIAGNOSIS — E538 Deficiency of other specified B group vitamins: Secondary | ICD-10-CM | POA: Diagnosis not present

## 2020-12-27 LAB — CBC WITH DIFFERENTIAL/PLATELET
Basophils Absolute: 0 10*3/uL (ref 0.0–0.1)
Basophils Relative: 0.3 % (ref 0.0–3.0)
Eosinophils Absolute: 0.1 10*3/uL (ref 0.0–0.7)
Eosinophils Relative: 0.6 % (ref 0.0–5.0)
HCT: 37.5 % (ref 36.0–46.0)
Hemoglobin: 12.2 g/dL (ref 12.0–15.0)
Lymphocytes Relative: 30.7 % (ref 12.0–46.0)
Lymphs Abs: 2.6 10*3/uL (ref 0.7–4.0)
MCHC: 32.6 g/dL (ref 30.0–36.0)
MCV: 83.9 fl (ref 78.0–100.0)
Monocytes Absolute: 0.4 10*3/uL (ref 0.1–1.0)
Monocytes Relative: 5.1 % (ref 3.0–12.0)
Neutro Abs: 5.4 10*3/uL (ref 1.4–7.7)
Neutrophils Relative %: 63.3 % (ref 43.0–77.0)
Platelets: 344 10*3/uL (ref 150.0–400.0)
RBC: 4.47 Mil/uL (ref 3.87–5.11)
RDW: 15.7 % — ABNORMAL HIGH (ref 11.5–15.5)
WBC: 8.5 10*3/uL (ref 4.0–10.5)

## 2020-12-27 MED ORDER — PHENTERMINE HCL 37.5 MG PO CAPS: 37.5000 mg | ORAL_CAPSULE | ORAL | 0 refills | Status: DC

## 2020-12-27 NOTE — Progress Notes (Signed)
Pre visit review using our clinic review tool, if applicable. No additional management support is needed unless otherwise documented below in the visit note. 

## 2020-12-27 NOTE — Patient Instructions (Addendum)
D3 4000 IU daily  B12 1000 mcg daily   Call [facility] for [facility]  [facility]  [facility] [place] Dr[facility]ris  [facility]    Iron Deficiency Anemia, Adult Iron deficiency anemia is when you do not have enough red blood cells or hemoglobin in your blood. This happens because you have too little iron in your body. Hemoglobin carries oxygen to parts of the body. Anemia can cause yourbody to not get enough oxygen. What are the causes? Not eating enough foods that have iron in them. The body not being able to take in iron well. Needing more iron due to pregnancy or heavy menstrual periods, for females. Cancer. Bleeding in the bowels. Many blood draws. What increases the risk? Being pregnant. Being a teenage girl going through a growth spurt. What are the signs or symptoms? Pale skin, lips, and nails. Weakness, dizziness, and getting tired easily. Headache. Feeling like you cannot breathe well when moving (shortness of breath). Cold hands and feet. Fast heartbeat or a heartbeat that is not regular. Feeling grouchy (irritable) or breathing fast. These are more common in very bad anemia. Mild anemia may not cause any symptoms. How is this treated? This condition is treated by finding out why you do not have enough iron and then getting more iron. It may include: Adding foods to your diet that have a lot of iron. Taking iron pills (supplements). If you are pregnant or breastfeeding, you may need to take extra iron. Your diet often does not provide the amount of iron that you need. Getting more vitamin C in your diet. Vitamin C helps your body take in iron. You may need to take iron pills with a glass of orange juice or vitamin C pills. Medicines to make heavy menstrual periods lighter. Surgery. You may need blood tests to see if treatment is working. If the treatment doesnot seem to be working, you may need more tests. Follow these instructions at  home: Medicines Take over-the-counter and prescription medicines only as told by your doctor. This includes iron pills and vitamins. Take iron pills when your stomach is empty. If you cannot handle this, take them with food. Do not drink milk or take antacids at the same time as your iron pills. Iron pills may turn your poop (stool)black. If you cannot handle taking iron pills by mouth, ask your doctor about getting iron through: An IV tube. A shot (injection) into a muscle. Eating and drinking  Talk with your doctor before changing the foods you eat. He or she may tell you to eat foods that have a lot of iron, such as: Liver. Low-fat (lean) beef. Breads and cereals that have iron added to them. Eggs. Dried fruit. Dark green, leafy vegetables. Eat fresh fruits and vegetables that are high in vitamin C. They help your body use iron. Foods with a lot of vitamin C include: Oranges. Peppers. Tomatoes. Mangoes. Drink enough fluid to keep your pee (urine) pale yellow.  Managing constipation If you are taking iron pills, they may cause trouble pooping (constipation). To prevent or treat trouble pooping, you may need to: Take over-the-counter or prescription medicines. Eat foods that are high in fiber. These include beans, whole grains, and fresh fruits and vegetables. Limit foods that are high in fat and sugar. These include fried or sweet foods. General instructions Return to your normal activities as told by your doctor. Ask your doctor what activities are safe for you. Keep yourself clean,  and keep things clean around you. Keep all follow-up visits as told by your doctor. This is important. Contact a doctor if: You feel like you may vomit (nauseous), or you vomit. You feel weak. You are sweating for no reason. You have trouble pooping, such as: Pooping less than 3 times a week. Straining to poop. Having poop that is hard, dry, or larger than normal. Feeling full or  bloated. Pain in the lower belly. Not feeling better after pooping. Get help right away if: You pass out (faint). You have chest pain. You have trouble breathing that: Is very bad. Gets worse with physical activity. You have a fast heartbeat, or a heartbeat that does not feel regular. You get light-headed when getting up from sitting or lying down. These symptoms may be an emergency. Do not wait to see if the symptoms will go away. Get medical help right away. Call your local emergency services (911 in the [place] Do not drive yourself to the hospital. Summary Iron deficiency anemia is when you have too little iron in your body. This condition is treated by finding out why you do not have enough iron in your body and then getting more iron. Take over-the-counter and prescription medicines only as told by your doctor. Eat fresh fruits and vegetables that are high in vitamin C. Get help right away if you cannot breathe well. This information is not intended to replace advice given to you by your health care provider. Make sure you discuss any questions you have with your healthcare provider. Document Revised: 01/24/2019 Document Reviewed: 01/24/2019 Elsevier Patient Education  2022 [employer]   Vitamin B12 Deficiency Vitamin B12 deficiency means that your body does not have enough vitamin B12. The body needs this vitamin: To make red blood cells. To make genes (DNA). To help the nerves work. If you do not have enough vitamin B12 in your body, you can have healthproblems. What are the causes? Not eating enough foods that contain vitamin B12. Not being able to absorb vitamin B12 from the food that you eat. Certain digestive system diseases. A condition in which the body does not make enough of a certain protein, which results in too few red blood cells (pernicious anemia). Having a surgery in which part of the stomach or small intestine is removed. Taking medicines that make it hard  for the body to absorb vitamin B12. These medicines include: Heartburn medicines. Some antibiotic medicines. Other medicines that are used to treat certain conditions. What increases the risk? Being older than age 93. Eating a vegetarian or vegan diet, especially while you are pregnant. Eating a poor diet while you are pregnant. Taking certain medicines. Having alcoholism. What are the signs or symptoms? In some cases, there are no symptoms. If the condition leads to too few blood cells or nerve damage, symptoms can occur, such as: Feeling weak. Feeling tired (fatigued). Not being hungry. Weight loss. A loss of feeling (numbness) or tingling in your hands and feet. Redness and burning of the tongue. Being mixed up (confused) or having memory problems. Sadness (depression). Problems with your senses. This can include color blindness, ringing in the ears, or loss of taste. Watery poop (diarrhea) or trouble pooping (constipation). Trouble walking. If anemia is very bad, symptoms can include: Being short of breath. Being dizzy. Having a very fast heartbeat. How is this treated? Changing the way you eat and drink, such as: Eating more foods that contain vitamin B12. Drinking little or no alcohol. Getting  vitamin B12 shots. Taking vitamin B12 supplements. Your doctor will tell you the dose that is best for you. Follow these instructions at home: Eating and drinking  Eat lots of healthy foods that contain vitamin B12. These include: Meats and poultry, such as beef, pork, chicken, [place], and organ meats, such as liver. Seafood, such as clams, rainbow trout, salmon, tuna, and haddock. Eggs. Cereal and dairy products that have vitamin B12 added to them. Check the label. The items listed above may not be a complete list of what you can eat and drink. Contact a dietitian for more options. General instructions Get any shots as told by your doctor. Take supplements only as told by your  doctor. Do not drink alcohol if your doctor tells you not to. In some cases, you may only be asked to limit alcohol use. Keep all follow-up visits as told by your doctor. This is important. Contact a doctor if: Your symptoms come back. Get help right away if: You have trouble breathing. You have a very fast heartbeat. You have chest pain. You get dizzy. You pass out. Summary Vitamin B12 deficiency means that your body is not getting enough vitamin B12. In some cases, there are no symptoms of this condition. Treatment may include making a change in the way you eat and drink, getting vitamin B12 shots, or taking supplements. Eat lots of healthy foods that contain vitamin B12. This information is not intended to replace advice given to you by your health care provider. Make sure you discuss any questions you have with your healthcare provider. Document Revised: 01/25/2018 Document Reviewed: 01/25/2018 Elsevier Patient Education  2022 [employer].  Vitamin D Deficiency Vitamin D deficiency is when your body does not have enough vitamin D. Vitamin D is important to your body for many reasons: It helps the body absorb two important minerals--calcium and phosphorus. It plays a role in bone health. It may help to prevent some diseases, such as diabetes and multiple sclerosis. It plays a role in muscle function, including heart function. If vitamin D deficiency is severe, it can cause a condition in which your bones become soft. In adults, this condition is called osteomalacia. In children,this condition is called rickets. What are the causes? This condition may be caused by: Not eating enough foods that contain vitamin D. Not getting enough natural sun exposure. Having certain digestive system diseases that make it difficult for your body to absorb vitamin D. These diseases include Crohn's disease, chronic pancreatitis, and cystic fibrosis. Having a surgery in which a part of the stomach or  a part of the small intestine is removed. Having chronic kidney disease or liver disease. What increases the risk? You are more likely to develop this condition if you: Are older. Do not spend much time outdoors. Live in a long-term care facility. Have had broken bones. Have weak or thin bones (osteoporosis). Have a disease or condition that changes how the body absorbs vitamin D. Have dark skin. Take certain medicines, such as steroid medicines or certain seizure medicines. Are overweight or obese. What are the signs or symptoms? In mild cases of vitamin D deficiency, there may not be any symptoms. If the condition is severe, symptoms may include: Bone pain. Muscle pain. Falling often. Broken bones caused by a minor injury. How is this diagnosed? This condition may be diagnosed with blood tests. Imaging tests such as Alma Friendly also be done to look for changes in the bone. How is this treated? Treatment for this  condition may depend on what caused the condition. Treatment options include: Taking vitamin D supplements. Your health care provider will suggest what dose is best for you. Taking a calcium supplement. Your health care provider will suggest what dose is best for you. Follow these instructions at home: Eating and drinking  Eat foods that contain vitamin D. Choices include: Fortified dairy products, cereals, or juices. Fortified means that vitamin D has been added to the food. Check the label on the package to see if the food is fortified. Fatty fish, such as salmon or trout. Eggs. Oysters. Mushrooms. The items listed above may not be a complete list of recommended foods and beverages. Contact a dietitian for more information. General instructions Take medicines and supplements only as told by your health care provider. Get regular, safe exposure to natural sunlight. Do not use a tanning bed. Maintain a healthy weight. Lose weight if needed. Keep all follow-up visits as  told by your health care provider. This is important. How is this prevented? You can get vitamin D by: Eating foods that naturally contain vitamin D. Eating or drinking products that have been fortified with vitamin D, such as cereals, juices, and dairy products (including milk). Taking a vitamin D supplement or a multivitamin supplement that contains vitamin D. Being in the sun. Your body naturally makes vitamin D when your skin is exposed to sunlight. Your body changes the sunlight into a form of the vitamin that it can use. Contact a health care provider if: Your symptoms do not go away. You feel nauseous or you vomit. You have fewer bowel movements than usual or are constipated. Summary Vitamin D deficiency is when your body does not have enough vitamin D. Vitamin D is important to your body for good bone health and muscle function, and it may help prevent some diseases. Vitamin D deficiency is primarily treated through supplementation. Your health care provider will suggest what dose is best for you. You can get vitamin D by eating foods that contain vitamin D, by being in the sun, and by taking a vitamin D supplement or a multivitamin supplement that contains vitamin D. This information is not intended to replace advice given to you by your health care provider. Make sure you discuss any questions you have with your healthcare provider. Document Revised: 01/24/2018 Document Reviewed: 01/24/2018 Elsevier Patient Education  2022 [employer].

## 2020-12-27 NOTE — Progress Notes (Signed)
Chief Complaint  Patient presents with   Follow-up   F/u  1. Obesity on adipex 37.5 x 3 months lost from 202 to 194 lbs  She is stopping job [employer] to be in [employer] program x 6 months    Review of Systems  Constitutional:  Positive for weight loss.  HENT:  Negative for hearing loss.   Eyes:  Negative for blurred vision.  Cardiovascular:  Negative for chest pain.  Skin:  Negative for rash.  Past Medical History:  Diagnosis Date   Complication of anesthesia    Slow to wake up   GERD (gastroesophageal reflux disease)    Iron deficiency anemia    Migraine    Past Surgical History:  Procedure Laterality Date   CHOLECYSTECTOMY N/A 04/03/2019   Procedure: LAPAROSCOPIC CHOLECYSTECTOMY;  Surgeon: Henrene Dodge, MD;  Location: [facility] ORS;  Service: General;  Laterality: N/A;   FOOT SURGERY     TONSILLECTOMY     WISDOM TOOTH EXTRACTION     Family History  Problem Relation Age of Onset   Hyperlipidemia Father    Hypertension Father    Breast cancer Maternal Grandmother    Lung cancer Maternal Grandfather    Bone cancer Paternal Grandmother    Cancer Paternal Grandmother    Breast cancer Maternal Uncle    Social History   Socioeconomic History   Marital status: Single    Spouse name: Not on file   Number of children: Not on file   Years of education: Not on file   Highest education level: Not on file  Occupational History   Not on file  Tobacco Use   Smoking status: Never   Smokeless tobacco: Never  Vaping Use   Vaping Use: Never used  Substance and Sexual Activity   Alcohol use: Yes    Comment: occasional   Drug use: No   Sexual activity: Not on file  Other Topics Concern   Not on file  Social History Narrative   Single   Has twin sister, Programmer, applications in high school   Not sexually active   Wants to be physical therapist   Social Determinants of Health   Financial Resource Strain: Not on file  Food Insecurity: Not on file  Transportation Needs:  Not on file  Physical Activity: Not on file  Stress: Not on file  Social Connections: Not on file  Intimate Partner Violence: Not on file   Current Meds  Medication Sig   ALPRAZolam (XANAX) 0.5 MG tablet Take 1 tablet (0.5 mg total) by mouth 3 (three) times daily as needed for anxiety.   Cyanocobalamin (B-12) 1000 MCG SUBL Place 2 tablets under the tongue daily.   hydrochlorothiazide (HYDRODIURIL) 12.5 MG tablet Take 1 tablet (12.5 mg total) by mouth daily.   ibuprofen (ADVIL) 600 MG tablet Take 1 tablet (600 mg total) by mouth every 8 (eight) hours as needed for mild pain or moderate pain.   Iron, Ferrous Sulfate, 325 (65 Fe) MG TABS Take 1 tablet by mouth daily.   Norgestimate-Ethinyl Estradiol Triphasic (TRI-PREVIFEM) 0.18/0.215/0.25 MG-35 MCG tablet Take 1 tablet by mouth daily. As directed   ondansetron (ZOFRAN ODT) 4 MG disintegrating tablet Take 1 tablet (4 mg total) by mouth every 8 (eight) hours as needed for nausea or vomiting.   Vitamin D, Ergocalciferol, (DRISDOL) 1.25 MG (50000 UNIT) CAPS capsule Take 1 capsule (50,000 Units total) by mouth every 7 (seven) days. (taking one tablet per week) scheduled Vitamin D lab  in  1-2 weeks after completing prescription.   [DISCONTINUED] phentermine 37.5 MG capsule Take 1 capsule (37.5 mg total) by mouth every morning.   No Known Allergies Recent Results (from the past 2160 hour(s))  Comprehensive metabolic panel     Status: None   Collection Time: 10/08/20  9:05 AM  Result Value Ref Range   Sodium 137 135 - 145 mEq/L   Potassium 3.7 3.5 - 5.1 mEq/L   Chloride 103 96 - 112 mEq/L   CO2 26 19 - 32 mEq/L   Glucose, Bld 76 70 - 99 mg/dL   BUN 14 6 - 23 mg/dL   Creatinine, Ser 6.22 0.40 - 1.20 mg/dL   Total Bilirubin 0.4 0.2 - 1.2 mg/dL   Alkaline Phosphatase 82 39 - 117 U/L   AST 16 0 - 37 U/L   ALT 17 0 - 35 U/L   Total Protein 6.6 6.0 - 8.3 g/dL   Albumin 3.9 3.5 - 5.2 g/dL   GFR 633.35 >45.62 mL/min    Comment: Calculated using  the CKD-EPI Creatinine Equation (2021)   Calcium 9.0 8.4 - 10.5 mg/dL  TSH     Status: None   Collection Time: 10/08/20  9:05 AM  Result Value Ref Range   TSH 2.52 0.35 - 4.50 uIU/mL  B12     Status: Abnormal   Collection Time: 10/08/20  9:05 AM  Result Value Ref Range   Vitamin B-12 195 (L) 211 - 911 pg/mL  Iron, TIBC and Ferritin Panel     Status: Abnormal   Collection Time: 10/08/20  9:05 AM  Result Value Ref Range   Iron 54 40 - 190 mcg/dL   TIBC 563 (H) 893 - 734 mcg/dL (calc)   %SAT 10 (L) 16 - 45 % (calc)   Ferritin 5 (L) 16 - 154 ng/mL  Intrinsic Factor Antibodies     Status: None   Collection Time: 10/08/20  9:05 AM  Result Value Ref Range   Intrinsic Factor Negative Negative    Comment: . For additional information, please refer to http://education.questdiagnostics.com/faq/IFAB (This link is being provided for informational/  educational purposes only.) .   VITAMIN D 25 Hydroxy (Vit-D Deficiency, Fractures)     Status: Abnormal   Collection Time: 10/08/20  9:05 AM  Result Value Ref Range   VITD 26.77 (L) 30.00 - 100.00 ng/mL  CBC with Differential/Platelet     Status: Abnormal   Collection Time: 10/08/20  9:05 AM  Result Value Ref Range   WBC 6.4 4.0 - 10.5 K/uL   RBC 4.40 3.87 - 5.11 Mil/uL   Hemoglobin 11.9 (L) 12.0 - 15.0 g/dL   HCT 28.7 68.1 - 15.7 %   MCV 81.9 78.0 - 100.0 fl   MCHC 33.1 30.0 - 36.0 g/dL   RDW 26.2 03.5 - 59.7 %   Platelets 313.0 150.0 - 400.0 K/uL   Neutrophils Relative % 65.0 43.0 - 77.0 %   Lymphocytes Relative 28.2 12.0 - 46.0 %   Monocytes Relative 5.2 3.0 - 12.0 %   Eosinophils Relative 1.3 0.0 - 5.0 %   Basophils Relative 0.3 0.0 - 3.0 %   Neutro Abs 4.1 1.4 - 7.7 K/uL   Lymphs Abs 1.8 0.7 - 4.0 K/uL   Monocytes Absolute 0.3 0.1 - 1.0 K/uL   Eosinophils Absolute 0.1 0.0 - 0.7 K/uL   Basophils Absolute 0.0 0.0 - 0.1 K/uL   Objective  Body mass index is 33.26 kg/m. Wt Readings from Last 3  Encounters:  12/27/20 194 lb 12.8  oz (88.4 kg)  11/12/20 195 lb 3.2 oz (88.5 kg)  10/08/20 202 lb 6.4 oz (91.8 kg)   Temp Readings from Last 3 Encounters:  12/27/20 97.8 F (36.6 C) (Oral)  11/12/20 98.2 F (36.8 C)  10/08/20 97.9 F (36.6 C)   BP Readings from Last 3 Encounters:  12/27/20 128/86  11/12/20 130/88  10/08/20 122/90   Pulse Readings from Last 3 Encounters:  12/27/20 94  11/12/20 (!) 105  10/08/20 87    Physical Exam Vitals and nursing note reviewed.  Constitutional:      Appearance: Normal appearance. She is well-developed and well-groomed. She is obese.  Eyes:     Conjunctiva/sclera: Conjunctivae normal.     Pupils: Pupils are equal, round, and reactive to light.  Cardiovascular:     Rate and Rhythm: Normal rate and regular rhythm.     Heart sounds: Normal heart sounds. No murmur heard. Pulmonary:     Effort: Pulmonary effort is normal.     Breath sounds: Normal breath sounds.  Skin:    General: Skin is warm and dry.  Neurological:     General: No focal deficit present.     Mental Status: She is alert and oriented to person, place, and time. Mental status is at baseline.     Gait: Gait normal.  Psychiatric:        Attention and Perception: Attention and perception normal.        Mood and Affect: Mood and affect normal.        Speech: Speech normal.        Behavior: Behavior normal. Behavior is cooperative.        Thought Content: Thought content normal.        Cognition and Memory: Cognition and memory normal.        Judgment: Judgment normal.    Assessment  Plan  Obesity (BMI 30-39.9) - Plan: phentermine 37.5 MG capsule x 1 more month Weight loss counseling, encounter for - Plan: phentermine 37.5 MG capsule  Iron deficiency anemia, unspecified iron deficiency anemia type - Plan: CBC with Differential/Platelet, Iron, TIBC and Ferritin Panel Taking mvt with iron   Vitamin B12 deficiency B12 1000 mcg qd declines inj  Vitamin D deficiency D3 rec take 4000 IU qd not taking    Atypical squamous cells of undetermined significance on cytologic smear of cervix (ASC-US)  Will call back with name of ob/gyn   Provider: Dr. French Ana McLean-Scocuzza-[facility]

## 2020-12-31 LAB — IRON,TIBC AND FERRITIN PANEL
%SAT: 14 % (calc) — ABNORMAL LOW (ref 16–45)
Ferritin: 5 ng/mL — ABNORMAL LOW (ref 16–154)
Iron: 82 ug/dL (ref 40–190)
TIBC: 583 mcg/dL (calc) — ABNORMAL HIGH (ref 250–450)

## 2021-01-06 ENCOUNTER — Telehealth: Payer: Self-pay

## 2021-01-06 NOTE — Telephone Encounter (Signed)
LMTCB in regards to lab results.  

## 2021-01-07 ENCOUNTER — Encounter: Payer: Self-pay | Admitting: Oncology

## 2021-01-07 ENCOUNTER — Other Ambulatory Visit (HOSPITAL_COMMUNITY): Payer: Self-pay

## 2021-01-07 MED ORDER — CARESTART COVID-19 HOME TEST VI KIT
PACK | 0 refills | Status: DC
  Filled 2021-01-07: qty 4, 4d supply, fill #0

## 2021-01-30 ENCOUNTER — Encounter: Payer: Self-pay | Admitting: Adult Health

## 2021-03-19 ENCOUNTER — Encounter: Payer: Self-pay | Admitting: Adult Health

## 2021-03-24 NOTE — Telephone Encounter (Signed)
Please advise 

## 2021-03-24 NOTE — Telephone Encounter (Signed)
Schedule with whose available does not have to be with me

## 2021-03-26 ENCOUNTER — Telehealth: Payer: Self-pay

## 2021-03-26 NOTE — Telephone Encounter (Addendum)
Would are pharmacist be able to evaluate and track weight loss medication? She is a female and that would satisfy her need for a female provider. Due to her PCP not in office, and no others have appointments for patient. If that is acceptable, she would need a referral to our pharmacist.    Also please see patient message 03-19-21.

## 2021-03-26 NOTE — Telephone Encounter (Signed)
Natalie Wu to P [facility] (supporting Flinchum, Eula Fried, FNP)    9:27 PM I'd prefer a female and I'm in school from 8-4:30 and lunch from 12:30-1  Natalie Wu, CMA to Natalie Wu   9:03 AM We have multiple providers in office. You'll be put int he available spot. When are you available to do a virtual visit? I have a virtual tomorrow with Dr.Sonnenberg available at 2:15.

## 2021-04-11 ENCOUNTER — Encounter: Payer: Self-pay | Admitting: Internal Medicine

## 2021-04-11 ENCOUNTER — Telehealth (INDEPENDENT_AMBULATORY_CARE_PROVIDER_SITE_OTHER): Payer: 59 | Admitting: Internal Medicine

## 2021-04-11 DIAGNOSIS — J0110 Acute frontal sinusitis, unspecified: Secondary | ICD-10-CM | POA: Diagnosis not present

## 2021-04-11 NOTE — Assessment & Plan Note (Signed)
3-4 days of symptoms Discussed likely viral etiology Recommended fluticasone nasal---2 sprays in each nostril twice a day for now Try advil 3 at a time (600mg ) up the three times a day---okay to take tylenol as well  If gets to the middle of next week and not improving, will try empiric amoxicillin for possible secondary bacterial infection

## 2021-04-11 NOTE — Progress Notes (Signed)
Subjective:    Patient ID: Natalie Wu, female    DOB: February 10, 1991, 30 y.o.   MRN: [id number]  HPI Video virtual visit due to respiratory symptoms Identification done Reviewed limitations and billing and she gave consent Participants---patient in her home and I am in my office  Has had sinus/head congestion for 3-4 days--all in face Inside nose and frontal Clear nasal drainage No chills or fever COVID test negative Tried sudafed--no help Lots of post nasal drip---frothy thick, some green No SOB  Tried dayquil also--not helpful No prior sinus issues or allergies  Tylenol last night didn't help  Current Outpatient Medications on File Prior to Visit  Medication Sig Dispense Refill  . ALPRAZolam (XANAX) 0.5 MG tablet Take 1 tablet (0.5 mg total) by mouth 3 (three) times daily as needed for anxiety. 20 tablet 0  . hydrochlorothiazide (HYDRODIURIL) 12.5 MG tablet Take 1 tablet (12.5 mg total) by mouth daily. 90 tablet 0  . ibuprofen (ADVIL) 600 MG tablet Take 1 tablet (600 mg total) by mouth every 8 (eight) hours as needed for mild pain or moderate pain. 30 tablet 0  . Iron, Ferrous Sulfate, 325 (65 Fe) MG TABS Take 1 tablet by mouth daily. 90 tablet 0  . Norgestimate-Ethinyl Estradiol Triphasic (TRI-PREVIFEM) 0.18/0.215/0.25 MG-35 MCG tablet Take 1 tablet by mouth daily. As directed 90 tablet 3  . ondansetron (ZOFRAN ODT) 4 MG disintegrating tablet Take 1 tablet (4 mg total) by mouth every 8 (eight) hours as needed for nausea or vomiting. 20 tablet 0  . phentermine 37.5 MG capsule Take 1 capsule (37.5 mg total) by mouth every morning. 30 capsule 0  . Vitamin D, Ergocalciferol, (DRISDOL) 1.25 MG (50000 UNIT) CAPS capsule Take 1 capsule (50,000 Units total) by mouth every 7 (seven) days. (taking one tablet per week) scheduled Vitamin D lab in  1-2 weeks after completing prescription. 12 capsule 0  . Cyanocobalamin (B-12) 1000 MCG SUBL Place 2 tablets under the tongue daily. (Patient not  taking: Reported on 04/11/2021) 90 tablet 0   No current facility-administered medications on file prior to visit.    No Known Allergies  Past Medical History:  Diagnosis Date  . Complication of anesthesia    Slow to wake up  . GERD (gastroesophageal reflux disease)   . Iron deficiency anemia   . Migraine     Past Surgical History:  Procedure Laterality Date  . CHOLECYSTECTOMY N/A 04/03/2019   Procedure: LAPAROSCOPIC CHOLECYSTECTOMY;  Surgeon: Henrene Dodge, MD;  Location: [facility] ORS;  Service: General;  Laterality: N/A;  . FOOT SURGERY    . TONSILLECTOMY    . WISDOM TOOTH EXTRACTION      Family History  Problem Relation Age of Onset  . Hyperlipidemia Father   . Hypertension Father   . Breast cancer Maternal Grandmother   . Lung cancer Maternal Grandfather   . Bone cancer Paternal Grandmother   . Cancer Paternal Grandmother   . Breast cancer Maternal Uncle     Social History   Socioeconomic History  . Marital status: Single    Spouse name: Not on file  . Number of children: Not on file  . Years of education: Not on file  . Highest education level: Not on file  Occupational History  . Not on file  Tobacco Use  . Smoking status: Never  . Smokeless tobacco: Never  Vaping Use  . Vaping Use: Never used  Substance and Sexual Activity  . Alcohol use: Yes    Comment:  occasional  . Drug use: No  . Sexual activity: Not on file  Other Topics Concern  . Not on file  Social History Narrative   Single   Has twin sister, Programmer, applications in high school   Not sexually active   Wants to be physical therapist   Social Determinants of Health   Financial Resource Strain: Not on file  Food Insecurity: Not on file  Transportation Needs: Not on file  Physical Activity: Not on file  Stress: Not on file  Social Connections: Not on file  Intimate Partner Violence: Not on file   Review of Systems No N/V No loss of smell or taste Eating okay In CMA program at [facility]      Objective:   Physical Exam Constitutional:      Appearance: Normal appearance.  Pulmonary:     Effort: Pulmonary effort is normal. No respiratory distress.  Neurological:     Mental Status: She is alert.           Assessment & Plan:

## 2021-04-14 MED ORDER — AMOXICILLIN 500 MG PO TABS: 1000.0000 mg | ORAL_TABLET | Freq: Two times a day (BID) | ORAL | 0 refills | Status: AC

## 2021-05-08 ENCOUNTER — Encounter: Payer: Self-pay | Admitting: Licensed Practical Nurse

## 2021-05-08 ENCOUNTER — Ambulatory Visit (INDEPENDENT_AMBULATORY_CARE_PROVIDER_SITE_OTHER): Payer: 59 | Admitting: Licensed Practical Nurse

## 2021-05-08 ENCOUNTER — Other Ambulatory Visit: Payer: Self-pay

## 2021-05-08 VITALS — BP 120/80 | Wt 206.0 lb

## 2021-05-08 DIAGNOSIS — Z3A01 Less than 8 weeks gestation of pregnancy: Secondary | ICD-10-CM | POA: Diagnosis not present

## 2021-05-08 DIAGNOSIS — Z3480 Encounter for supervision of other normal pregnancy, unspecified trimester: Secondary | ICD-10-CM | POA: Diagnosis not present

## 2021-05-08 DIAGNOSIS — Z3491 Encounter for supervision of normal pregnancy, unspecified, first trimester: Secondary | ICD-10-CM

## 2021-05-08 DIAGNOSIS — Z113 Encounter for screening for infections with a predominantly sexual mode of transmission: Secondary | ICD-10-CM | POA: Diagnosis not present

## 2021-05-08 DIAGNOSIS — Z3490 Encounter for supervision of normal pregnancy, unspecified, unspecified trimester: Secondary | ICD-10-CM | POA: Diagnosis not present

## 2021-05-08 LAB — POCT URINALYSIS DIPSTICK OB
Glucose, UA: NEGATIVE
POC,PROTEIN,UA: NEGATIVE

## 2021-05-08 LAB — POCT URINE PREGNANCY: Preg Test, Ur: POSITIVE — AB

## 2021-05-08 NOTE — Addendum Note (Signed)
Addended by: Cornelius Moras D on: 05/08/2021 04:48 PM   Modules accepted: Orders

## 2021-05-08 NOTE — Progress Notes (Signed)
New Obstetric Patient H&P    Chief Complaint: "Desires prenatal care"   History of Present Illness: Patient is a 30 y.o. G1P0 Not Hispanic or Latino female, presents with amenorrhea and positive home pregnancy test. Patient's last menstrual period was 03/26/2021. and based on her  LMP, her EDD is Estimated Date of Delivery: 12/31/21 and her EGA is [redacted]w[redacted]d.She Stopped OCP;s in September, had a cycle in October and then became pregnant. Her last pap smear was 2 years ago and was no abnormalities.    She had a urine pregnancy test which was positive a few day(s)  ago. Her last menstrual period was normal . Since her LMP she claims she has experienced brest tenderness. She denies vaginal bleeding. Her past medical history is contibutory Iron deficiency anemia, obesity and anxiety  Since her LMP, she admits to the use of tobacco products  no She claims she has gained   no pounds since the start of her pregnancy.  There are cats in the home in the home  no She admits close contact with children on a regular basis  no  She has had chicken pox in the past yes She has had Tuberculosis exposures, symptoms, or previously tested positive for TB   no Current or past history of domestic violence. no  Genetic Screening/Teratology Counseling: (Includes patient, baby's father, or anyone in either family with:)   1. Patient's age >/= 84 at [facility]  no 2. Thalassemia ([place], [place], Mediterranean, or Asian background): MCV<80  no 3. Neural tube defect (meningomyelocele, spina bifida, anencephaly)  no 4. Congenital heart defect  no  5. Down syndrome  no 6. Tay-Sachs (Jewish, [place]  no 7. Canavan's Disease  no 8. Sickle cell disease or trait (African)  no  9. Hemophilia or other blood disorders  no  10. Muscular dystrophy  no  11. Cystic fibrosis  no  12. Huntington's Chorea  no  13. Mental retardation/autism  no 14. Other inherited genetic or chromosomal disorder  no 15. Maternal metabolic  disorder (DM, PKU, etc)  no 16. Patient or FOB with a child with a birth defect not listed above no  16a. Patient or FOB with a birth defect themselves no 17. Recurrent pregnancy loss, or stillbirth  no  18. Any medications since LMP other than prenatal vitamins (include vitamins, supplements, OTC meds, drugs, alcohol)  no 19. Any other genetic/environmental exposure to discuss  no  Infection History:   1. Lives with someone with TB or TB exposed  no  2. Patient or partner has history of genital herpes  No  3. Rash or viral illness since LMP  no 4. History of STI (GC, CT, HPV, syphilis, HIV)  no 5. History of recent travel :  no  Other pertinent information:  no     Review of Systems:10 point review of systems negative unless otherwise noted in HPI  Past Medical History:  Patient Active Problem List   Diagnosis Date Noted  . Supervision of other normal pregnancy, antepartum 05/08/2021     Nursing Staff Provider  Office Location  [facility] Dating    Language  English  Anatomy US    Flu Vaccine  Oct 2022 Genetic Screen  NIPS:   TDaP vaccine    Hgb A1C or  GTT Early : Third trimester :   Covid    LAB RESULTS   Rhogam   Blood Type     Feeding Plan Breast Antibody    Contraception  [place]  Circumcision  RPR     Pediatrician   HBsAg     Support Person Kyle HIV    Prenatal Classes  Varicella     GBS  (For PCN allergy, check sensitivities)   BTL Consent     VBAC Consent  Pap      Hgb Electro    Pelvis Tested  CF      SMA            . Acute non-recurrent frontal sinusitis 04/11/2021  . Obesity (BMI 30-39.9) 12/27/2020  . B12 deficiency 10/08/2020  . Vitamin D insufficiency 10/08/2020  . Chronic headaches 03/20/2019  . Weight loss counseling, encounter for 08/02/2018  . Situational anxiety 09/20/2017  . Iron deficiency anemia 06/27/2015    Past Surgical History:  Past Surgical History:  Procedure Laterality Date  . CHOLECYSTECTOMY N/A 04/03/2019   Procedure:  LAPAROSCOPIC CHOLECYSTECTOMY;  Surgeon: Henrene Dodge, MD;  Location: [facility] ORS;  Service: General;  Laterality: N/A;  . FOOT SURGERY    . TONSILLECTOMY    . WISDOM TOOTH EXTRACTION      Gynecologic History: Patient's last menstrual period was 03/26/2021.  Obstetric History: G1P0  Family History:  Family History  Problem Relation Age of Onset  . Hyperlipidemia Father   . Hypertension Father   . Breast cancer Maternal Grandmother   . Lung cancer Maternal Grandfather   . Bone cancer Paternal Grandmother   . Cancer Paternal Grandmother   . Breast cancer Maternal Uncle     Social History:  Social History   Socioeconomic History  . Marital status: Single    Spouse name: Not on file  . Number of children: Not on file  . Years of education: Not on file  . Highest education level: Not on file  Occupational History  . Not on file  Tobacco Use  . Smoking status: Never  . Smokeless tobacco: Never  Vaping Use  . Vaping Use: Never used  Substance and Sexual Activity  . Alcohol use: Yes    Comment: occasional  . Drug use: No  . Sexual activity: Not on file  Other Topics Concern  . Not on file  Social History Narrative   Single   Has twin sister, Programmer, applications in high school   Not sexually active   Wants to be physical therapist   Social Determinants of Health   Financial Resource Strain: Not on file  Food Insecurity: Not on file  Transportation Needs: Not on file  Physical Activity: Not on file  Stress: Not on file  Social Connections: Not on file  Intimate Partner Violence: Not on file    Allergies:  No Known Allergies  Medications: Prior to Admission medications   Medication Sig Start Date End Date Taking? Authorizing Provider  ALPRAZolam Prudy Feeler) 0.5 MG tablet Take 1 tablet (0.5 mg total) by mouth 3 (three) times daily as needed for anxiety. Patient not taking: Reported on 05/08/2021 09/20/17   Dianne Dun, MD  Cyanocobalamin (B-12) 1000 MCG SUBL Place 2  tablets under the tongue daily. Patient not taking: Reported on 04/11/2021 10/09/20   Flinchum, Eula Fried, FNP  hydrochlorothiazide (HYDRODIURIL) 12.5 MG tablet Take 1 tablet (12.5 mg total) by mouth daily. Patient not taking: Reported on 05/08/2021 10/08/20   Flinchum, Eula Fried, FNP  ibuprofen (ADVIL) 600 MG tablet Take 1 tablet (600 mg total) by mouth every 8 (eight) hours as needed for mild pain or moderate pain. Patient not taking: Reported on 05/08/2021 04/03/19  Henrene Dodge, MD  Iron, Ferrous Sulfate, 325 (65 Fe) MG TABS Take 1 tablet by mouth daily. Patient not taking: Reported on 05/08/2021 10/09/20   Flinchum, Eula Fried, FNP  Norgestimate-Ethinyl Estradiol Triphasic (TRI-PREVIFEM) 0.18/0.215/0.25 MG-35 MCG tablet Take 1 tablet by mouth daily. As directed Patient not taking: Reported on 05/08/2021 11/28/20   McLean-Scocuzza, Pasty Spillers, MD  ondansetron (ZOFRAN ODT) 4 MG disintegrating tablet Take 1 tablet (4 mg total) by mouth every 8 (eight) hours as needed for nausea or vomiting. Patient not taking: Reported on 05/08/2021 04/03/19   Henrene Dodge, MD  phentermine 37.5 MG capsule Take 1 capsule (37.5 mg total) by mouth every morning. Patient not taking: Reported on 05/08/2021 12/27/20   McLean-Scocuzza, Pasty Spillers, MD  Vitamin D, Ergocalciferol, (DRISDOL) 1.25 MG (50000 UNIT) CAPS capsule Take 1 capsule (50,000 Units total) by mouth every 7 (seven) days. (taking one tablet per week) scheduled Vitamin D lab in  1-2 weeks after completing prescription. Patient not taking: Reported on 05/08/2021 10/09/20   FlinchumEula Fried, FNP    Physical Exam Vitals: Blood pressure 120/80, weight 206 lb (93.4 kg), last menstrual period 03/26/2021.  General: NAD HEENT: normocephalic, anicteric Thyroid: no enlargement, no palpable nodules Pulmonary: No increased work of breathing, CTAB Cardiovascular: RRR, distal pulses 2+ Abdomen: NABS, soft, non-tender, non-distended.  Umbilicus without lesions.  No  hepatomegaly, splenomegaly or masses palpable. No evidence of hernia  Genitourinary: exam deferred  Extremities: no edema, erythema, or tenderness Neurologic: Grossly intact Psychiatric: mood appropriate, affect full   Assessment: 30 y.o. G1P0 at [redacted]w[redacted]d presenting to initiate prenatal care  Plan: 1) Avoid alcoholic beverages. 2) Patient encouraged not to smoke.  3) Discontinue the use of all non-medicinal drugs and chemicals.  4) Take prenatal vitamins daily.  5) Nutrition, food safety (fish, cheese advisories, and high nitrite foods) and exercise discussed. 6) Hospital and practice style discussed with cross coverage system.  7) Genetic Screening, such as with 1st Trimester Screening, cell free fetal DNA, AFP testing, and Ultrasound, as well as with amniocentesis and CVS as appropriate, is discussed with patient. At the conclusion of today's visit patient undecided genetic testing 8) Patient is asked about travel to areas at risk for the [place] virus, and counseled to avoid travel and exposure to mosquitoes or sexual partners who may have themselves been exposed to the virus. Testing is discussed, and will be ordered as appropriate.  9) Return in 2 weeks for dating [place] and labs  Carie Caddy, [place] [facility], [facility] 05/08/2021, 4:05 PM

## 2021-05-12 ENCOUNTER — Encounter: Payer: Self-pay | Admitting: Licensed Practical Nurse

## 2021-05-13 LAB — GC/CHLAMYDIA PROBE AMP
Chlamydia trachomatis, NAA: NEGATIVE
Neisseria Gonorrhoeae by PCR: NEGATIVE

## 2021-05-16 LAB — CULTURE, URINE COMPREHENSIVE

## 2021-05-19 ENCOUNTER — Encounter: Payer: Self-pay | Admitting: Licensed Practical Nurse

## 2021-05-20 ENCOUNTER — Ambulatory Visit
Admission: RE | Admit: 2021-05-20 | Discharge: 2021-05-20 | Disposition: A | Discharge status: Home / Self Care | Payer: 59 | Source: Ambulatory Visit | Attending: Licensed Practical Nurse | Admitting: Licensed Practical Nurse

## 2021-05-20 ENCOUNTER — Other Ambulatory Visit: Payer: Self-pay

## 2021-05-20 DIAGNOSIS — Z3480 Encounter for supervision of other normal pregnancy, unspecified trimester: Secondary | ICD-10-CM | POA: Diagnosis present

## 2021-05-20 DIAGNOSIS — O0991 Supervision of high risk pregnancy, unspecified, first trimester: Secondary | ICD-10-CM | POA: Diagnosis not present

## 2021-05-20 DIAGNOSIS — Z3491 Encounter for supervision of normal pregnancy, unspecified, first trimester: Secondary | ICD-10-CM | POA: Diagnosis not present

## 2021-05-20 DIAGNOSIS — Z3A01 Less than 8 weeks gestation of pregnancy: Secondary | ICD-10-CM | POA: Diagnosis not present

## 2021-05-20 DIAGNOSIS — Z3490 Encounter for supervision of normal pregnancy, unspecified, unspecified trimester: Secondary | ICD-10-CM

## 2021-05-28 ENCOUNTER — Ambulatory Visit (INDEPENDENT_AMBULATORY_CARE_PROVIDER_SITE_OTHER): Payer: 59 | Admitting: Advanced Practice Midwife

## 2021-05-28 ENCOUNTER — Other Ambulatory Visit: Payer: Self-pay

## 2021-05-28 ENCOUNTER — Encounter: Payer: Self-pay | Admitting: Advanced Practice Midwife

## 2021-05-28 VITALS — BP 120/80 | Wt 203.0 lb

## 2021-05-28 DIAGNOSIS — Z3480 Encounter for supervision of other normal pregnancy, unspecified trimester: Secondary | ICD-10-CM

## 2021-05-28 DIAGNOSIS — Z3A01 Less than 8 weeks gestation of pregnancy: Secondary | ICD-10-CM

## 2021-05-28 LAB — POCT URINALYSIS DIPSTICK OB
Glucose, UA: NEGATIVE
POC,PROTEIN,UA: NEGATIVE

## 2021-05-28 NOTE — Progress Notes (Signed)
°  Routine Prenatal Care Visit  Subjective  Natalie Wu is a 30 y.o. G1P0 at [redacted]w[redacted]d being seen today for ongoing prenatal care.  She is currently monitored for the following issues for this low-risk pregnancy and has Iron deficiency anemia; Situational anxiety; Weight loss counseling, encounter for; Chronic headaches; B12 deficiency; Vitamin D insufficiency; Obesity (BMI 30-39.9); Acute non-recurrent frontal sinusitis; and Supervision of other normal pregnancy, antepartum on their problem list.  ----------------------------------------------------------------------------------- Patient reports mild nausea and mild headaches. Comfort measures reviewed. EDD adjusted to ultrasound dating.    . Vag. Bleeding: None.   . Leaking Fluid denies.  ----------------------------------------------------------------------------------- The following portions of the patient's history were reviewed and updated as appropriate: allergies, current medications, past family history, past medical history, past social history, past surgical history and problem list. Problem list updated.  Objective  Blood pressure 120/80, weight 203 lb (92.1 kg), last menstrual period 03/26/2021. Pregravid weight 206 lb (93.4 kg) Total Weight Gain -3 lb (-1.361 kg) Urinalysis: Urine Protein Negative  Urine Glucose Negative  Fetal Status:           General:  Alert, oriented and cooperative. Patient is in no acute distress.  Skin: Skin is warm and dry. No rash noted.   Cardiovascular: Normal heart rate noted  Respiratory: Normal respiratory effort, no problems with respiration noted  Abdomen: Soft, gravid, appropriate for gestational age. Pain/Pressure: Absent     Pelvic:  Cervical exam deferred        Extremities: Normal range of motion.     Mental Status: Normal mood and affect. Normal behavior. Normal judgment and thought content.    Nursing Staff Provider  Office Location  [facility] Dating  EDD by 6w u/s  Language  English   Anatomy US    Flu Vaccine  Oct 2022 Genetic Screen  NIPS:   TDaP vaccine    Hgb A1C or  GTT Early : Third trimester :   Covid    LAB RESULTS   Rhogam   Blood Type     Feeding Plan Breast Antibody    Contraception  Rubella    Circumcision  RPR     Pediatrician   HBsAg     Support Person Kyle HIV    Prenatal Classes  Varicella     GBS  (For PCN allergy, check sensitivities)   BTL Consent     VBAC Consent  Pap      Hgb Electro    Pelvis Tested  CF      SMA          Assessment   30 y.o. G1P0 at [redacted]w[redacted]d by  01/10/2022, by Ultrasound presenting for routine prenatal visit  Plan   pregnancy1 Problems (from 05/08/21 to present)    No problems associated with this episode.       Preterm labor symptoms and general obstetric precautions including but not limited to vaginal bleeding, contractions, leaking of fluid and fetal movement were reviewed in detail with the patient. Please refer to After Visit Summary for other counseling recommendations.   Return in about 4 weeks (around 06/25/2021) for rob[facility] MaT 21.  Tresea Mall, CNM 05/28/2021 8:43 AM

## 2021-05-28 NOTE — Patient Instructions (Signed)
First Trimester of Pregnancy °The first trimester of pregnancy starts on the first day of your last menstrual period until the end of week 12. This is months 1 through 3 of pregnancy. A week after a sperm fertilizes an egg, the egg will implant into the wall of the uterus and begin to develop into a baby. By the end of 12 weeks, all the baby's organs will be formed and the baby will be 2-3 inches in size. °Body changes during your first trimester °Your body goes through many changes during pregnancy. The changes vary and generally return to normal after your baby is born. °Physical changes °You may gain or lose weight. °Your breasts may begin to grow larger and become tender. The tissue that surrounds your nipples (areola) may become darker. °Dark spots or blotches (chloasma or mask of pregnancy) may develop on your face. °You may have changes in your hair. These can include thickening or thinning of your hair or changes in texture. °Health changes °You may feel nauseous, and you may vomit. °You may have heartburn. °You may develop headaches. °You may develop constipation. °Your gums may bleed and may be sensitive to brushing and flossing. °Other changes °You may tire easily. °You may urinate more often. °Your menstrual periods will stop. °You may have a loss of appetite. °You may develop cravings for certain kinds of food. °You may have changes in your emotions from day to day. °You may have more vivid and strange dreams. °Follow these instructions at home: °Medicines °Follow your health care provider's instructions regarding medicine use. Specific medicines may be either safe or unsafe to take during pregnancy. Do not take any medicines unless told to by your health care provider. °Take a prenatal vitamin that contains at least 600 micrograms (mcg) of folic acid. °Eating and drinking °Eat a healthy diet that includes fresh fruits and vegetables, whole grains, good sources of protein such as meat, eggs, or tofu,  and low-fat dairy products. °Avoid raw meat and unpasteurized juice, milk, and cheese. These carry germs that can harm you and your baby. °If you feel nauseous or you vomit: °Eat 4 or 5 small meals a day instead of 3 large meals. °Try eating a few soda crackers. °Drink liquids between meals instead of during meals. °You may need to take these actions to prevent or treat constipation: °Drink enough fluid to keep your urine pale yellow. °Eat foods that are high in fiber, such as beans, whole grains, and fresh fruits and vegetables. °Limit foods that are high in fat and processed sugars, such as fried or sweet foods. °Activity °Exercise only as directed by your health care provider. Most people can continue their usual exercise routine during pregnancy. Try to exercise for 30 minutes at least 5 days a week. °Stop exercising if you develop pain or cramping in the lower abdomen or lower back. °Avoid exercising if it is very hot or humid or if you are at high altitude. °Avoid heavy lifting. °If you choose to, you may have sex unless your health care provider tells you not to. °Relieving pain and discomfort °Wear a good support bra to relieve breast tenderness. °Rest with your legs elevated if you have leg cramps or low back pain. °If you develop bulging veins (varicose veins) in your legs: °Wear support hose as told by your health care provider. °Elevate your feet for 15 minutes, 3-4 times a day. °Limit salt in your diet. °Safety °Wear your seat belt at all times when driving   or riding in a car. Talk with your health care provider if someone is verbally or physically abusive to you. Talk with your health care provider if you are feeling sad or have thoughts of hurting yourself. Lifestyle Do not use hot tubs, steam rooms, or saunas. Do not douche. Do not use tampons or scented sanitary pads. Do not use herbal remedies, alcohol, illegal drugs, or medicines that are not approved by your health care provider. Chemicals  in these products can harm your baby. Do not use any products that contain nicotine or tobacco, such as cigarettes, e-cigarettes, and chewing tobacco. If you need help quitting, ask your health care provider. Avoid cat litter boxes and soil used by cats. These carry germs that can cause birth defects in the baby and possibly loss of the unborn baby (fetus) by miscarriage or stillbirth. General instructions During routine prenatal visits in the first trimester, your health care provider will do a physical exam, perform necessary tests, and ask you how things are going. Keep all follow-up visits. This is important. Ask for help if you have counseling or nutritional needs during pregnancy. Your health care provider can offer advice or refer you to specialists for help with various needs. Schedule a dentist appointment. At home, brush your teeth with a soft toothbrush. Floss gently. Write down your questions. Take them to your prenatal visits. Where to find more information American Pregnancy Association: americanpregnancy.org [employer]: https://www.todd-brady.net/ Office on [employer] Health: [employer] Contact a health care provider if you have: Dizziness. A fever. Mild pelvic cramps, pelvic pressure, or nagging pain in the abdominal area. Nausea, vomiting, or diarrhea that lasts for 24 hours or longer. A bad-smelling vaginal discharge. Pain when you urinate. Known exposure to a contagious illness, such as chickenpox, measles, Zika virus, HIV, or hepatitis. Get help right away if you have: Spotting or bleeding from your vagina. Severe abdominal cramping or pain. Shortness of breath or chest pain. Any kind of trauma, such as from a fall or a car crash. New or increased pain, swelling, or redness in an arm or leg. Summary The first trimester of pregnancy starts on the first day of your last menstrual period until the end of week  12 (months 1 through 3). Eating 4 or 5 small meals a day rather than 3 large meals may help to relieve nausea and vomiting. Do not use any products that contain nicotine or tobacco, such as cigarettes, e-cigarettes, and chewing tobacco. If you need help quitting, ask your health care provider. Keep all follow-up visits. This is important. This information is not intended to replace advice given to you by your health care provider. Make sure you discuss any questions you have with your health care provider. Document Revised: 10/25/2019 Document Reviewed: 08/31/2019 Elsevier Patient Education  2022 Elsevier Inc. Morning Sickness Morning sickness is when a woman feels nauseous during pregnancy. This nauseous feeling may or may not come with vomiting. It often occurs in the morning, but it can be a problem at any time of day. Morning sickness is most common during the first trimester. In some cases, it may continue throughout pregnancy. Although morning sickness is unpleasant, it is usually harmless unless the woman develops severe and continual vomiting (hyperemesis gravidarum), a condition that requires more intense treatment. What are the causes? The exact cause of this condition is not known, but it seems to be related to normal hormonal changes that occur in pregnancy. What increases the risk? You are  more likely to develop this condition if: You experienced nausea or vomiting before your pregnancy. You had morning sickness during a previous pregnancy. You are pregnant with more than one baby, such as twins. What are the signs or symptoms? Symptoms of this condition include: Nausea. Vomiting. How is this diagnosed? This condition is usually diagnosed based on your signs and symptoms. How is this treated? In many cases, treatment is not needed for this condition. Making some changes to what you eat may help to control symptoms. Your health care provider may also prescribe or  recommend: Vitamin B6 supplements. Anti-nausea medicines. Ginger. Follow these instructions at home: Medicines Take over-the-counter and prescription medicines only as told by your health care provider. Do not use any prescription, over-the-counter, or herbal medicines for morning sickness without first talking with your health care provider. Take multivitamins before getting pregnant. This can prevent or decrease the severity of morning sickness in most women. Eating and drinking Eat a piece of dry toast or crackers before getting out of bed in the morning. Eat 5 or 6 small meals a day. Eat dry and bland foods, such as rice or a baked potato. Foods that are high in carbohydrates are often helpful. Avoid greasy, fatty, and spicy foods. Have someone cook for you if the smell of any food causes nausea and vomiting. If you feel nauseous after taking prenatal vitamins, take the vitamins at night or with a snack. Eat a protein snack between meals if you are hungry. Nuts, yogurt, and cheese are good options. Drink fluids throughout the day. Try ginger ale made with real ginger, ginger tea made from fresh grated ginger, or ginger candies. General instructions Do not use any products that contain nicotine or tobacco. These products include cigarettes, chewing tobacco, and vaping devices, such as e-cigarettes. If you need help quitting, ask your health care provider. Get an air purifier to keep the air in your house free of odors. Get plenty of fresh air. Try to avoid odors that trigger your nausea. Consider trying these methods to help relieve symptoms: Wearing an acupressure wristband. These wristbands are often worn for seasickness. Acupuncture. Contact a health care provider if: Your home remedies are not working and you need medicine. You feel dizzy or light-headed. You are losing weight. Get help right away if: You have persistent and uncontrolled nausea and vomiting. You faint. You  have severe pain in your abdomen. Summary Morning sickness is when a woman feels nauseous during pregnancy. This nauseous feeling may or may not come with vomiting. Morning sickness is most common during the first trimester. It often occurs in the morning, but it can be a problem at any time of day. In many cases, treatment is not needed for this condition. Making some changes to what you eat may help to control symptoms. This information is not intended to replace advice given to you by your health care provider. Make sure you discuss any questions you have with your health care provider. Document Revised: 01/01/2020 Document Reviewed: 12/11/2019 Elsevier Patient Education  2022 [employer].

## 2021-06-01 NOTE — L&D Delivery Note (Signed)
       Delivery Note   Natalie Wu is a 31 y.o. G1P0 at [redacted]w[redacted]d Estimated Date of Delivery: 01/10/22  PRE-OPERATIVE DIAGNOSIS:  1) [redacted]w[redacted]d pregnancy. Gestational hypertension.    POST-OPERATIVE DIAGNOSIS:  1) [redacted]w[redacted]d pregnancy s/p Vaginal, Vacuum (Extractor) . Gestational hypertension.   Delivery Type: Vaginal, Vacuum (Extractor)    Delivery Anesthesia: Epidural   Labor Complications: none     ESTIMATED BLOOD LOSS: 400 ml    FINDINGS:   1) female infant, Apgar scores of 7 at 1 minute and 9  at 5 minutes and a birthweight pending, infant remains skin to skin.     2) Nuchal cord: none  SPECIMENS:   PLACENTA:   Appearance:  intact, 3 vessel cord, cord blood sample collected   Removal:    spontaneous    Disposition:  per protocol   DISPOSITION:  Infant to left in stable condition in the delivery room, with L&D personnel and mother,  NARRATIVE SUMMARY: Labor course:  Ms. Natalie Wu is a G1P0 at [redacted]w[redacted]d who presented for induction of labor for gestational hypertension..  She progressed well in labor with pitocin.  She received the appropriate epidural anesthesia and proceeded to complete dilation. She evidenced good maternal expulsive effort during the second stage. After 2 hours of pushing patient became fatigued and was experiencing back/shoulder pain that resulted in poor pushing efforts. PT & her partner consented to vacuum assistance. Kiwi vacuum was placed on fetal head at +2 station.  3 pulls with Pop off x 1.  She went on to deliver a viable female infant  "Natalie Wu" in ROP position. The placenta delivered without problems and was noted to be complete. A perineal and vaginal examination was performed.Lacerations:  2nd degree vaginal , perineal .  The lacerations were repaired with 3-0 Vicryl Rapide suture using local anesthesia. The patient tolerated this well.  Doreene Burke, CNM  12/22/2021 5:14 PM

## 2021-06-09 ENCOUNTER — Encounter: Payer: Self-pay | Admitting: Obstetrics

## 2021-06-16 ENCOUNTER — Encounter: Payer: Self-pay | Admitting: Advanced Practice Midwife

## 2021-06-17 ENCOUNTER — Other Ambulatory Visit: Payer: Self-pay

## 2021-06-17 ENCOUNTER — Ambulatory Visit (INDEPENDENT_AMBULATORY_CARE_PROVIDER_SITE_OTHER): Payer: 59 | Admitting: Obstetrics

## 2021-06-17 VITALS — BP 122/74 | Wt 200.0 lb

## 2021-06-17 DIAGNOSIS — Z1379 Encounter for other screening for genetic and chromosomal anomalies: Secondary | ICD-10-CM

## 2021-06-17 DIAGNOSIS — Z3480 Encounter for supervision of other normal pregnancy, unspecified trimester: Secondary | ICD-10-CM | POA: Diagnosis not present

## 2021-06-17 DIAGNOSIS — Z3A10 10 weeks gestation of pregnancy: Secondary | ICD-10-CM

## 2021-06-17 NOTE — Progress Notes (Signed)
No vb. No lof. Genetic testing today  °

## 2021-06-17 NOTE — Progress Notes (Signed)
Routine Prenatal Care Visit  Subjective  Natalie Wu is a 31 y.o. G1P0 at [redacted]w[redacted]d being seen today for ongoing prenatal care.  She is currently monitored for the following issues for this low-risk pregnancy and has Iron deficiency anemia; Situational anxiety; Weight loss counseling, encounter for; Chronic headaches; B12 deficiency; Vitamin D insufficiency; Obesity (BMI 30-39.9); Acute non-recurrent frontal sinusitis; and Supervision of other normal pregnancy, antepartum on their problem list.  ----------------------------------------------------------------------------------- Patient reports no complaints.  She shares a history of anxiety and use of Xanax.She worries about miscarriage often. Also shares a hx of anemia and use of iron infusions.  . Vag. Bleeding: None.   . Leaking Fluid denies.  ----------------------------------------------------------------------------------- The following portions of the patient's history were reviewed and updated as appropriate: allergies, current medications, past family history, past medical history, past social history, past surgical history and problem list. Problem list updated.  Objective  Blood pressure 122/74, weight 200 lb (90.7 kg), last menstrual period 03/26/2021. Pregravid weight 206 lb (93.4 kg) Total Weight Gain -6 lb (-2.722 kg) Urinalysis: Urine Protein    Urine Glucose    Fetal Status:           General:  Alert, oriented and cooperative. Patient is in no acute distress.  Skin: Skin is warm and dry. No rash noted.   Cardiovascular: Normal heart rate noted  Respiratory: Normal respiratory effort, no problems with respiration noted  Abdomen: Soft, gravid, appropriate for gestational age. Pain/Pressure: Absent     Pelvic:  Cervical exam deferred        Extremities: Normal range of motion.     Mental Status: Normal mood and affect. Normal behavior. Normal judgment and thought content.   Assessment   31 y.o. G1P0 at [redacted]w[redacted]d by  01/10/2022, by  Ultrasound presenting for routine prenatal visit  Plan   pregnancy1 Problems (from 05/08/21 to present)    Problem Noted Resolved   Supervision of other normal pregnancy, antepartum 05/08/2021 by Ellwood Sayers, CNM No   Overview Addendum 06/17/2021 10:05 AM by Mirna Mires, CNM     Nursing Staff Provider  Office Location  [facility] Dating  EDD by 6w u/s  Language  English  Anatomy US    Flu Vaccine  Oct 2022 Genetic Screen  NIPS:   TDaP vaccine    Hgb A1C or  GTT Early : Third trimester :   Covid    LAB RESULTS   Rhogam   Blood Type     Feeding Plan Breast Antibody    Contraception  Rubella    Circumcision  RPR     Pediatrician   HBsAg     Support Person Kyle HIV    Prenatal Classes  Varicella     GBS  (For PCN allergy, check sensitivities)   BTL Consent     VBAC Consent  Pap      Hgb Electro    Pelvis Tested  CF      SMA                   Preterm labor symptoms and general obstetric precautions including but not limited to vaginal bleeding, contractions, leaking of fluid and fetal movement were reviewed in detail with the patient. Please refer to After Visit Summary for other counseling recommendations.  Much reassurance provided about her pregnancy. Offered option of medication to address any mood struggles.  No follow-ups on file.  Mirna Mires, CNM  06/17/2021 10:26 AM

## 2021-06-18 LAB — CBC/D/PLT+RPR+RH+ABO+RUBIGG...
Antibody Screen: NEGATIVE
Basophils Absolute: 0 10*3/uL (ref 0.0–0.2)
Basos: 0 %
EOS (ABSOLUTE): 0.1 10*3/uL (ref 0.0–0.4)
Eos: 2 %
HCV Ab: <0.1 s/co ratio (ref 0.0–0.9)
HIV Screen 4th Generation wRfx: NONREACTIVE
Hematocrit: 37.9 % (ref 34.0–46.6)
Hemoglobin: 12.3 g/dL (ref 11.1–15.9)
Hepatitis B Surface Ag: NEGATIVE
Immature Grans (Abs): 0 10*3/uL (ref 0.0–0.1)
Immature Granulocytes: 0 %
Lymphocytes Absolute: 2 10*3/uL (ref 0.7–3.1)
Lymphs: 26 %
MCH: 27.6 pg (ref 26.6–33.0)
MCHC: 32.5 g/dL (ref 31.5–35.7)
MCV: 85 fL (ref 79–97)
Monocytes Absolute: 0.6 10*3/uL (ref 0.1–0.9)
Monocytes: 7 %
Neutrophils Absolute: 4.9 10*3/uL (ref 1.4–7.0)
Neutrophils: 65 %
Platelets: 291 10*3/uL (ref 150–450)
RBC: 4.46 x10E6/uL (ref 3.77–5.28)
RDW: 14.3 % (ref 11.7–15.4)
RPR Ser Ql: NONREACTIVE
Rh Factor: POSITIVE
Rubella Antibodies, IGG: 2.66 index (ref >0.99)
Varicella zoster IgG: 457 index (ref >165)
WBC: 7.6 10*3/uL (ref 3.4–10.8)

## 2021-06-18 LAB — HCV INTERPRETATION

## 2021-06-18 LAB — HEMOGLOBIN A1C
Est. average glucose Bld gHb Est-mCnc: 105 mg/dL
Hgb A1c MFr Bld: 5.3 % (ref 4.8–5.6)

## 2021-06-19 ENCOUNTER — Encounter: Payer: 59 | Admitting: Advanced Practice Midwife

## 2021-06-23 LAB — MATERNIT 21 PLUS CORE, BLOOD
Fetal Fraction: 6
Result (T21): NEGATIVE
Trisomy 13 (Patau syndrome): NEGATIVE
Trisomy 18 (Edwards syndrome): NEGATIVE
Trisomy 21 (Down syndrome): NEGATIVE

## 2021-06-24 ENCOUNTER — Encounter: Payer: Self-pay | Admitting: Oncology

## 2021-06-25 ENCOUNTER — Encounter: Payer: 59 | Admitting: Obstetrics

## 2021-07-15 ENCOUNTER — Ambulatory Visit (INDEPENDENT_AMBULATORY_CARE_PROVIDER_SITE_OTHER): Payer: 59 | Admitting: Licensed Practical Nurse

## 2021-07-15 ENCOUNTER — Other Ambulatory Visit: Payer: Self-pay

## 2021-07-15 ENCOUNTER — Encounter: Payer: Self-pay | Admitting: Licensed Practical Nurse

## 2021-07-15 VITALS — BP 120/78 | Wt 205.0 lb

## 2021-07-15 DIAGNOSIS — Z3A14 14 weeks gestation of pregnancy: Secondary | ICD-10-CM

## 2021-07-15 DIAGNOSIS — Z3480 Encounter for supervision of other normal pregnancy, unspecified trimester: Secondary | ICD-10-CM

## 2021-07-15 LAB — POCT URINALYSIS DIPSTICK
Color, UA: NEGATIVE
Glucose, UA: NEGATIVE
Protein, UA: NEGATIVE

## 2021-07-15 NOTE — Progress Notes (Signed)
Routine Prenatal Care Visit  Subjective  Natalie Wu is a 31 y.o. G1P0 at [redacted]w[redacted]d being seen today for ongoing prenatal care.  She is currently monitored for the following issues for this low-risk pregnancy and has Iron deficiency anemia; Situational anxiety; Weight loss counseling, encounter for; Chronic headaches; B12 deficiency; Vitamin D insufficiency; Obesity (BMI 30-39.9); Acute non-recurrent frontal sinusitis; and Supervision of other normal pregnancy, antepartum on their problem list.  ----------------------------------------------------------------------------------- Patient reports no complaints.  Doing well.  Reports anxiety has decreased, not as worried about miscarriage anymore.   . Vag. Bleeding: None.   . Leaking Fluid denies.  ----------------------------------------------------------------------------------- The following portions of the patient's history were reviewed and updated as appropriate: allergies, current medications, past family history, past medical history, past social history, past surgical history and problem list. Problem list updated.  Objective  Blood pressure 120/78, weight 205 lb (93 kg), last menstrual period 03/26/2021. Pregravid weight 206 lb (93.4 kg) Total Weight Gain -1 lb (-0.454 kg) Urinalysis: Urine Protein    Urine Glucose    Fetal Status: Fetal Heart Rate (bpm): 155         General:  Alert, oriented and cooperative. Patient is in no acute distress.  Skin: Skin is warm and dry. No rash noted.   Cardiovascular: Normal heart rate noted  Respiratory: Normal respiratory effort, no problems with respiration noted  Abdomen: Soft, gravid, appropriate for gestational age. Pain/Pressure: Absent     Pelvic:  Cervical exam deferred        Extremities: Normal range of motion.     Mental Status: Normal mood and affect. Normal behavior. Normal judgment and thought content.   Assessment   31 y.o. G1P0 at [redacted]w[redacted]d by  01/10/2022, by Ultrasound presenting for  routine prenatal visit  Plan   pregnancy1 Problems (from 05/08/21 to present)     Problem Noted Resolved   Supervision of other normal pregnancy, antepartum 05/08/2021 by Ellwood Sayers, CNM No   Overview Addendum 07/15/2021  9:20 AM by Ellwood Sayers, CNM     Nursing Staff Provider  Office Location  [facility] Dating  EDD by 6w u/s  Language  English  Anatomy US    Flu Vaccine  Oct 2022 Genetic Screen  NIPS: negative- xx  TDaP vaccine    Hgb A1C or  GTT Early :5.3 Third trimester :   Covid    LAB RESULTS   Rhogam  NA Blood Type O/Positive/-- (01/17 1033)   Feeding Plan Breast Antibody Negative (01/17 1033)  Contraception  Rubella 2.66 (01/17 1033)  Circumcision NA RPR Non Reactive (01/17 1033)   Pediatrician   HBsAg Negative (01/17 1033)   Support Person Ronaldo Miyamoto HIV Non Reactive (01/17 1033)  Prenatal Classes  Varicella Immune     GBS  (For PCN allergy, check sensitivities)   BTL Consent     VBAC Consent  Pap      Hgb Electro    Pelvis Tested  CF      SMA                     general obstetric precautions including but not limited to vaginal bleeding, contractions, leaking of fluid and fetal movement were reviewed in detail with the patient. Please refer to After Visit Summary for other counseling recommendations.   Return in about 4 weeks (around 08/12/2021) for ROB.  Anatomy US ordered  Carie Caddy, CNM  Domingo Pulse, [facility]  07/15/21  9:23 AM

## 2021-07-15 NOTE — Progress Notes (Signed)
No vb. No lof.  

## 2021-07-15 NOTE — Addendum Note (Signed)
Addended by: Liliane Shi on: 07/15/2021 09:27 AM   Modules accepted: Orders

## 2021-08-11 ENCOUNTER — Ambulatory Visit
Admission: RE | Admit: 2021-08-11 | Discharge: 2021-08-11 | Disposition: A | Discharge status: Home / Self Care | Payer: 59 | Source: Ambulatory Visit | Attending: Licensed Practical Nurse | Admitting: Licensed Practical Nurse

## 2021-08-11 DIAGNOSIS — Z3689 Encounter for other specified antenatal screening: Secondary | ICD-10-CM | POA: Insufficient documentation

## 2021-08-11 DIAGNOSIS — Z3A18 18 weeks gestation of pregnancy: Secondary | ICD-10-CM | POA: Diagnosis not present

## 2021-08-11 DIAGNOSIS — Z3480 Encounter for supervision of other normal pregnancy, unspecified trimester: Secondary | ICD-10-CM | POA: Insufficient documentation

## 2021-08-12 ENCOUNTER — Other Ambulatory Visit: Payer: Self-pay

## 2021-08-12 ENCOUNTER — Encounter: Payer: Self-pay | Admitting: Licensed Practical Nurse

## 2021-08-12 ENCOUNTER — Ambulatory Visit (INDEPENDENT_AMBULATORY_CARE_PROVIDER_SITE_OTHER): Payer: 59 | Admitting: Licensed Practical Nurse

## 2021-08-12 VITALS — BP 118/70 | Wt 207.0 lb

## 2021-08-12 DIAGNOSIS — Z3400 Encounter for supervision of normal first pregnancy, unspecified trimester: Secondary | ICD-10-CM

## 2021-08-12 DIAGNOSIS — Z3A18 18 weeks gestation of pregnancy: Secondary | ICD-10-CM

## 2021-08-12 NOTE — Progress Notes (Signed)
ROB- no concerns 

## 2021-08-12 NOTE — Progress Notes (Signed)
Routine Prenatal Care Visit ? ?Subjective  ?Natalie Wu is a 31 y.o. G1P0 at [redacted]w[redacted]d being seen today for ongoing prenatal care.  She is currently monitored for the following issues for this low-risk pregnancy and has Iron deficiency anemia; Situational anxiety; Weight loss counseling, encounter for; Chronic headaches; B12 deficiency; Vitamin D insufficiency; Obesity (BMI 30-39.9); Acute non-recurrent frontal sinusitis; and Supervision of other normal pregnancy, antepartum on their problem list.  ?----------------------------------------------------------------------------------- ?Patient reports no complaints.  Doing well.  Had anatomy US yesterday, needs repeat in 4 weeks for insufficient views. Mood is good.  Currently getting ready to move to a house in April. Going for walks when able.  Looking for CBE and BF classes.  ? .  .   . Leaking Fluid denies.  ?----------------------------------------------------------------------------------- ?The following portions of the patient's history were reviewed and updated as appropriate: allergies, current medications, past family history, past medical history, past social history, past surgical history and problem list. Problem list updated. ? ?Objective  ?Blood pressure 118/70, weight 207 lb (93.9 kg), last menstrual period 03/26/2021. ?Pregravid weight 206 lb (93.4 kg) Total Weight Gain 1 lb (0.454 kg) ?Urinalysis: Urine Protein    Urine Glucose   ? ?Fetal Status:          ? ?General:  Alert, oriented and cooperative. Patient is in no acute distress.  ?Skin: Skin is warm and dry. No rash noted.   ?Cardiovascular: Normal heart rate noted  ?Respiratory: Normal respiratory effort, no problems with respiration noted  ?Abdomen: Soft, gravid, appropriate for gestational age.       ?Pelvic:  Cervical exam deferred        ?Extremities: Normal range of motion.     ?Mental Status: Normal mood and affect. Normal behavior. Normal judgment and thought content.  ? ?Assessment  ? ?31  y.o. G1P0 at [redacted]w[redacted]d by  01/10/2022, by Ultrasound presenting for routine prenatal visit ? ?Plan  ? ?pregnancy1 Problems (from 05/08/21 to present)   ? ? Problem Noted Resolved  ? Supervision of other normal pregnancy, antepartum 05/08/2021 by Allen Derry, CNM No  ? Overview Addendum 07/15/2021  9:20 AM by Allen Derry, CNM  ?   ?Nursing Staff Provider  ?Office Location  [facility] Dating  EDD by 6w u/s  ?Language  English  Anatomy US    ?Flu Vaccine  Oct 2022 Genetic Screen  NIPS: negative- xx  ?TDaP vaccine    Hgb A1C or  ?GTT Early :5.3 ?Third trimester :   ?Covid    LAB RESULTS   ?Rhogam  NA Blood Type O/Positive/-- (01/17 1033)   ?Feeding Plan Breast Antibody Negative (01/17 1033)  ?Contraception  Rubella 2.66 (01/17 1033)  ?Circumcision NA RPR Non Reactive (01/17 1033)   ?Pediatrician   HBsAg Negative (01/17 1033)   ?Support Person Marylyn Ishihara HIV Non Reactive (01/17 1033)  ?Prenatal Classes  Varicella Immune   ?  GBS  (For PCN allergy, check sensitivities)   ?BTL Consent     ?VBAC Consent  Pap    ?  Hgb Electro    ?Pelvis Tested  CF   ?   SMA   ?     ? ? ?  ?  ? ?  ?  ? ?general obstetric precautions including but not limited to vaginal bleeding, contractions, leaking of fluid and fetal movement were reviewed in detail with the patient. ?Please refer to After Visit Summary for other counseling recommendations.  ? ?Return in about 4 weeks (around 09/09/2021) for  ROB. ? ?Roberto Scales, CNM  ?[facility] Pigeon, [facility]  ?08/12/21  ?8:32 AM  ? ?

## 2021-08-22 ENCOUNTER — Other Ambulatory Visit (HOSPITAL_COMMUNITY)
Admission: RE | Admit: 2021-08-22 | Discharge: 2021-08-22 | Disposition: A | Discharge status: Home / Self Care | Payer: 59 | Source: Ambulatory Visit | Attending: Licensed Practical Nurse | Admitting: Licensed Practical Nurse

## 2021-08-22 ENCOUNTER — Other Ambulatory Visit: Payer: Self-pay | Admitting: Licensed Practical Nurse

## 2021-08-22 DIAGNOSIS — N898 Other specified noninflammatory disorders of vagina: Secondary | ICD-10-CM

## 2021-08-22 DIAGNOSIS — O26892 Other specified pregnancy related conditions, second trimester: Secondary | ICD-10-CM | POA: Diagnosis not present

## 2021-08-26 LAB — CERVICOVAGINAL ANCILLARY ONLY
Bacterial Vaginitis (gardnerella): NEGATIVE
Candida Glabrata: NEGATIVE
Candida Vaginitis: NEGATIVE
Comment: NEGATIVE
Comment: NEGATIVE
Comment: NEGATIVE

## 2021-09-11 ENCOUNTER — Ambulatory Visit (INDEPENDENT_AMBULATORY_CARE_PROVIDER_SITE_OTHER): Payer: 59 | Admitting: Licensed Practical Nurse

## 2021-09-11 ENCOUNTER — Ambulatory Visit
Admission: RE | Admit: 2021-09-11 | Discharge: 2021-09-11 | Disposition: A | Discharge status: Home / Self Care | Payer: 59 | Source: Ambulatory Visit | Attending: Licensed Practical Nurse | Admitting: Licensed Practical Nurse

## 2021-09-11 ENCOUNTER — Encounter: Payer: Self-pay | Admitting: Licensed Practical Nurse

## 2021-09-11 VITALS — BP 128/70 | Wt 210.0 lb

## 2021-09-11 DIAGNOSIS — Z3400 Encounter for supervision of normal first pregnancy, unspecified trimester: Secondary | ICD-10-CM

## 2021-09-11 DIAGNOSIS — Z131 Encounter for screening for diabetes mellitus: Secondary | ICD-10-CM

## 2021-09-11 DIAGNOSIS — Z3A22 22 weeks gestation of pregnancy: Secondary | ICD-10-CM | POA: Insufficient documentation

## 2021-09-11 DIAGNOSIS — O26812 Pregnancy related exhaustion and fatigue, second trimester: Secondary | ICD-10-CM | POA: Diagnosis not present

## 2021-09-11 DIAGNOSIS — O321XX0 Maternal care for breech presentation, not applicable or unspecified: Secondary | ICD-10-CM | POA: Diagnosis not present

## 2021-09-11 DIAGNOSIS — Z3689 Encounter for other specified antenatal screening: Secondary | ICD-10-CM | POA: Insufficient documentation

## 2021-09-11 DIAGNOSIS — Z3A23 23 weeks gestation of pregnancy: Secondary | ICD-10-CM | POA: Diagnosis not present

## 2021-09-11 NOTE — Progress Notes (Signed)
Routine Prenatal Care Visit ? ?Subjective  ?Natalie Wu is a 31 y.o. G1P0 at [redacted]w[redacted]d being seen today for ongoing prenatal care.  She is currently monitored for the following issues for this low-risk pregnancy and has Iron deficiency anemia; Situational anxiety; Weight loss counseling, encounter for; Chronic headaches; B12 deficiency; Vitamin D insufficiency; Obesity (BMI 30-39.9); Acute non-recurrent frontal sinusitis; and Supervision of other normal pregnancy, antepartum on their problem list.  ?----------------------------------------------------------------------------------- ?Patient reports fatigue and craving ice. Now feeling FM! Moving into new house.   Plans on taking BF classes, hopes to take online CBE.  Rec newborn 31.  ?Contractions: Not present.  .   . Leaking Fluid denies.  ?----------------------------------------------------------------------------------- ?The following portions of the patient's history were reviewed and updated as appropriate: allergies, current medications, past family history, past medical history, past social history, past surgical history and problem list. Problem list updated. ? ?Objective  ?Blood pressure 128/70, weight 210 lb (95.3 kg), last menstrual period 03/26/2021. ?Pregravid weight 206 lb (93.4 kg) Total Weight Gain 4 lb (1.814 kg) ?Urinalysis: Urine Protein    Urine Glucose   ? ?Fetal Status: Fetal Heart Rate (bpm): 156        ? ?General:  Alert, oriented and cooperative. Patient is in no acute distress.  ?Skin: Skin is warm and dry. No rash noted.   ?Cardiovascular: Normal heart rate noted  ?Respiratory: Normal respiratory effort, no problems with respiration noted  ?Abdomen: Soft, gravid, appropriate for gestational age. Pain/Pressure: Absent     ?Pelvic:  Cervical exam deferred        ?Extremities: Normal range of motion.  Edema: None  ?Mental Status: Normal mood and affect. Normal behavior. Normal judgment and thought content.  ? ?Assessment  ? ?31 y.o. G1P0 at  [redacted]w[redacted]d by  01/10/2022, by Ultrasound presenting for routine prenatal visit ? ?Plan  ? ?pregnancy1 Problems (from 05/08/21 to present)   ? ? Problem Noted Resolved  ? Supervision of other normal pregnancy, antepartum 05/08/2021 by Ellwood Sayers, CNM No  ? Overview Addendum 08/12/2021  8:34 AM by Ellwood Sayers, CNM  ?   ?Nursing Staff Provider  ?Office Location  [facility] Dating  EDD by 6w u/s  ?Language  English  Anatomy US  Norm, insuff views  ?Flu Vaccine  Oct 2022 Genetic Screen  NIPS: negative- xx  ?TDaP vaccine    Hgb A1C or  ?GTT Early :5.3 ?Third trimester :   ?Covid    LAB RESULTS   ?Rhogam  NA Blood Type O/Positive/-- (01/17 1033)   ?Feeding Plan Breast Antibody Negative (01/17 1033)  ?Contraception  Rubella 2.66 (01/17 1033)  ?Circumcision NA RPR Non Reactive (01/17 1033)   ?Pediatrician   HBsAg Negative (01/17 1033)   ?Support Person Ronaldo Miyamoto HIV Non Reactive (01/17 1033)  ?Prenatal Classes  Varicella Immune   ?  GBS  (For PCN allergy, check sensitivities)   ?BTL Consent     ?VBAC Consent  Pap ASC-US HPV neg 2020   ?  Hgb Electro    ?Pelvis Tested  CF   ?   SMA   ?     ? ? ?  ?  ? ?  ?  ?general obstetric precautions including but not limited to vaginal bleeding, contractions, leaking of fluid and fetal movement were reviewed in detail with the patient. ?Please refer to After Visit Summary for other counseling recommendations.  ? ?Return in about 4 weeks (around 10/09/2021) for ROB. ? ?GDM screening next visit  ? ?CBC  collected today for fatigue and craving ice  ? ?Carie Caddy, CNM  ?[facility] Pulse, [facility]  ?09/11/21  ?8:22 AM  ? ? ?

## 2021-09-12 LAB — CBC WITH DIFFERENTIAL/PLATELET
Basophils Absolute: 0 10*3/uL (ref 0.0–0.2)
Basos: 0 %
EOS (ABSOLUTE): 0.1 10*3/uL (ref 0.0–0.4)
Eos: 1 %
Hematocrit: 33.9 % — ABNORMAL LOW (ref 34.0–46.6)
Hemoglobin: 10.8 g/dL — ABNORMAL LOW (ref 11.1–15.9)
Immature Grans (Abs): 0.1 10*3/uL (ref 0.0–0.1)
Immature Granulocytes: 1 %
Lymphocytes Absolute: 2.4 10*3/uL (ref 0.7–3.1)
Lymphs: 23 %
MCH: 26.8 pg (ref 26.6–33.0)
MCHC: 31.9 g/dL (ref 31.5–35.7)
MCV: 84 fL (ref 79–97)
Monocytes Absolute: 0.4 10*3/uL (ref 0.1–0.9)
Monocytes: 4 %
Neutrophils Absolute: 7.3 10*3/uL — ABNORMAL HIGH (ref 1.4–7.0)
Neutrophils: 71 %
Platelets: 296 10*3/uL (ref 150–450)
RBC: 4.03 x10E6/uL (ref 3.77–5.28)
RDW: 14.3 % (ref 11.7–15.4)
WBC: 10.2 10*3/uL (ref 3.4–10.8)

## 2021-10-09 ENCOUNTER — Other Ambulatory Visit: Payer: 59

## 2021-10-09 ENCOUNTER — Encounter: Payer: 59 | Admitting: Licensed Practical Nurse

## 2021-10-09 ENCOUNTER — Ambulatory Visit (INDEPENDENT_AMBULATORY_CARE_PROVIDER_SITE_OTHER): Payer: 59

## 2021-10-09 ENCOUNTER — Other Ambulatory Visit: Payer: Self-pay | Admitting: Licensed Practical Nurse

## 2021-10-09 DIAGNOSIS — R350 Frequency of micturition: Secondary | ICD-10-CM

## 2021-10-09 DIAGNOSIS — Z131 Encounter for screening for diabetes mellitus: Secondary | ICD-10-CM | POA: Diagnosis not present

## 2021-10-09 DIAGNOSIS — R399 Unspecified symptoms and signs involving the genitourinary system: Secondary | ICD-10-CM

## 2021-10-09 DIAGNOSIS — Z3400 Encounter for supervision of normal first pregnancy, unspecified trimester: Secondary | ICD-10-CM

## 2021-10-09 DIAGNOSIS — N390 Urinary tract infection, site not specified: Secondary | ICD-10-CM

## 2021-10-09 LAB — POCT URINALYSIS DIPSTICK OB
Bilirubin, UA: NEGATIVE
Blood, UA: NEGATIVE
Glucose, UA: NEGATIVE
Ketones, UA: NEGATIVE
Nitrite, UA: NEGATIVE
Urobilinogen, UA: 0.2 E.U./dL
pH, UA: 5 (ref 5.0–8.0)

## 2021-10-09 MED ORDER — CEPHALEXIN 500 MG PO CAPS: 500.0000 mg | ORAL_CAPSULE | Freq: Four times a day (QID) | ORAL | 2 refills | Status: DC

## 2021-10-10 ENCOUNTER — Ambulatory Visit (INDEPENDENT_AMBULATORY_CARE_PROVIDER_SITE_OTHER): Payer: 59 | Admitting: Licensed Practical Nurse

## 2021-10-10 ENCOUNTER — Encounter: Payer: Self-pay | Admitting: Licensed Practical Nurse

## 2021-10-10 ENCOUNTER — Other Ambulatory Visit: Payer: 59

## 2021-10-10 VITALS — BP 110/70 | Wt 215.0 lb

## 2021-10-10 DIAGNOSIS — R7309 Other abnormal glucose: Secondary | ICD-10-CM

## 2021-10-10 DIAGNOSIS — Z3A26 26 weeks gestation of pregnancy: Secondary | ICD-10-CM

## 2021-10-10 DIAGNOSIS — Z3400 Encounter for supervision of normal first pregnancy, unspecified trimester: Secondary | ICD-10-CM

## 2021-10-10 LAB — 28 WEEK RH+PANEL
Basophils Absolute: 0 10*3/uL (ref 0.0–0.2)
Basos: 0 %
EOS (ABSOLUTE): 0.1 10*3/uL (ref 0.0–0.4)
Eos: 1 %
Gestational Diabetes Screen: 139 mg/dL (ref 70–139)
HIV Screen 4th Generation wRfx: NONREACTIVE
Hematocrit: 32.4 % — ABNORMAL LOW (ref 34.0–46.6)
Hemoglobin: 10.3 g/dL — ABNORMAL LOW (ref 11.1–15.9)
Immature Grans (Abs): 0.1 10*3/uL (ref 0.0–0.1)
Immature Granulocytes: 1 %
Lymphocytes Absolute: 2.2 10*3/uL (ref 0.7–3.1)
Lymphs: 21 %
MCH: 26.1 pg — ABNORMAL LOW (ref 26.6–33.0)
MCHC: 31.8 g/dL (ref 31.5–35.7)
MCV: 82 fL (ref 79–97)
Monocytes Absolute: 0.4 10*3/uL (ref 0.1–0.9)
Monocytes: 4 %
Neutrophils Absolute: 7.8 10*3/uL — ABNORMAL HIGH (ref 1.4–7.0)
Neutrophils: 73 %
Platelets: 308 10*3/uL (ref 150–450)
RBC: 3.94 x10E6/uL (ref 3.77–5.28)
RDW: 13.8 % (ref 11.7–15.4)
RPR Ser Ql: NONREACTIVE
WBC: 10.5 10*3/uL (ref 3.4–10.8)

## 2021-10-10 NOTE — Progress Notes (Signed)
Routine Prenatal Care Visit ? ?Subjective  ?Natalie Wu is a 31 y.o. G1P0 at [redacted]w[redacted]d being seen today for ongoing prenatal care.  She is currently monitored for the following issues for this low-risk pregnancy and has Iron deficiency anemia; Situational anxiety; Weight loss counseling, encounter for; Chronic headaches; B12 deficiency; Vitamin D insufficiency; Obesity (BMI 30-39.9); Acute non-recurrent frontal sinusitis; and Supervision of other normal pregnancy, antepartum on their problem list.  ?----------------------------------------------------------------------------------- ?Patient reports  had increased frequency, feeling like she is not emptying her bladder, and dysuria, started on Keflex yesterday, symptoms starting to improve, culture pending.  ?-Looking for CBE but not finding anything that fits her schedule ?-reviewed 28 wk labs, glucose 139, will do three hour ?-currently not having IC, seems ok with this, reassured it is safe to have IC in pregnancy.  ? ? .  .   . Leaking Fluid denies.  ?----------------------------------------------------------------------------------- ?The following portions of the patient's history were reviewed and updated as appropriate: allergies, current medications, past family history, past medical history, past social history, past surgical history and problem list. Problem list updated. ? ?Objective  ?Blood pressure 110/70, weight 215 lb (97.5 kg), last menstrual period 03/26/2021. ?Pregravid weight 206 lb (93.4 kg) Total Weight Gain 9 lb (4.082 kg) ?Urinalysis: Urine Protein    Urine Glucose   ? ?Fetal Status: Fetal Heart Rate (bpm): 156       active  ? ?General:  Alert, oriented and cooperative. Patient is in no acute distress.  ?Skin: Skin is warm and dry. No rash noted.   ?Cardiovascular: Normal heart rate noted  ?Respiratory: Normal respiratory effort, no problems with respiration noted  ?Abdomen: Soft, gravid, appropriate for gestational age.       ?Pelvic:  Cervical  exam deferred        ?Extremities: Normal range of motion.  Edema: None  ?Mental Status: Normal mood and affect. Normal behavior. Normal judgment and thought content.  ? ?Assessment  ? ?31 y.o. G1P0 at [redacted]w[redacted]d by  01/10/2022, by Ultrasound presenting for routine prenatal visit ? ?Plan  ? ?pregnancy1 Problems (from 05/08/21 to present)   ? ? Problem Noted Resolved  ? Supervision of other normal pregnancy, antepartum 05/08/2021 by Ellwood Sayers, CNM No  ? Overview Addendum 08/12/2021  8:34 AM by Ellwood Sayers, CNM  ?   ?Nursing Staff Provider  ?Office Location  [facility] Dating  EDD by 6w u/s  ?Language  English  Anatomy US  Norm, insuff views  ?Flu Vaccine  Oct 2022 Genetic Screen  NIPS: negative- xx  ?TDaP vaccine    Hgb A1C or  ?GTT Early :5.3 ?Third trimester :   ?Covid    LAB RESULTS   ?Rhogam  NA Blood Type O/Positive/-- (01/17 1033)   ?Feeding Plan Breast Antibody Negative (01/17 1033)  ?Contraception  Rubella 2.66 (01/17 1033)  ?Circumcision NA RPR Non Reactive (01/17 1033)   ?Pediatrician   HBsAg Negative (01/17 1033)   ?Support Person Ronaldo Miyamoto HIV Non Reactive (01/17 1033)  ?Prenatal Classes  Varicella Immune   ?  GBS  (For PCN allergy, check sensitivities)   ?BTL Consent     ?VBAC Consent  Pap ASC-US HPV neg 2020   ?  Hgb Electro    ?Pelvis Tested  CF   ?   SMA   ?     ? ? ?  ?  ? ?  ?  ? ?Preterm labor symptoms and general obstetric precautions including but not limited to vaginal bleeding, contractions,  leaking of fluid and fetal movement were reviewed in detail with the patient. ?Please refer to After Visit Summary for other counseling recommendations.  ? ?Return in about 3 weeks (around 10/31/2021) for ROB. ? ?Needs three hour Glucola ? ?Carie Caddy, CNM  ?[facility] Pulse, [facility]  ?10/10/21  ?10:59 AM  ? ? ?

## 2021-10-10 NOTE — Progress Notes (Signed)
ROB- no concerns 

## 2021-10-11 LAB — URINE CULTURE

## 2021-10-13 ENCOUNTER — Other Ambulatory Visit: Payer: Self-pay | Admitting: Licensed Practical Nurse

## 2021-10-13 DIAGNOSIS — O219 Vomiting of pregnancy, unspecified: Secondary | ICD-10-CM

## 2021-10-13 MED ORDER — ONDANSETRON 4 MG PO TBDP: 4.0000 mg | ORAL_TABLET | Freq: Four times a day (QID) | ORAL | 0 refills | Status: AC | PRN

## 2021-10-15 ENCOUNTER — Encounter: Payer: 59 | Admitting: Licensed Practical Nurse

## 2021-10-31 ENCOUNTER — Other Ambulatory Visit: Payer: 59

## 2021-11-03 ENCOUNTER — Encounter: Payer: Self-pay | Admitting: Licensed Practical Nurse

## 2021-11-03 ENCOUNTER — Other Ambulatory Visit: Payer: Self-pay | Admitting: Licensed Practical Nurse

## 2021-11-03 ENCOUNTER — Ambulatory Visit (INDEPENDENT_AMBULATORY_CARE_PROVIDER_SITE_OTHER): Payer: 59 | Admitting: Licensed Practical Nurse

## 2021-11-03 VITALS — BP 100/70 | Wt 219.0 lb

## 2021-11-03 DIAGNOSIS — Z3480 Encounter for supervision of other normal pregnancy, unspecified trimester: Secondary | ICD-10-CM

## 2021-11-03 DIAGNOSIS — Z3400 Encounter for supervision of normal first pregnancy, unspecified trimester: Secondary | ICD-10-CM

## 2021-11-03 DIAGNOSIS — Z23 Encounter for immunization: Secondary | ICD-10-CM | POA: Diagnosis not present

## 2021-11-03 DIAGNOSIS — Z3403 Encounter for supervision of normal first pregnancy, third trimester: Secondary | ICD-10-CM

## 2021-11-03 DIAGNOSIS — Z6837 Body mass index (BMI) 37.0-37.9, adult: Secondary | ICD-10-CM

## 2021-11-03 DIAGNOSIS — Z3A30 30 weeks gestation of pregnancy: Secondary | ICD-10-CM

## 2021-11-03 LAB — POCT URINALYSIS DIPSTICK OB
Glucose, UA: NEGATIVE
POC,PROTEIN,UA: NEGATIVE

## 2021-11-03 NOTE — Addendum Note (Signed)
Addended by: Drenda Freeze on: 11/03/2021 09:44 AM   Modules accepted: Orders

## 2021-11-03 NOTE — Addendum Note (Signed)
Addended by: Donnetta Hail on: 11/03/2021 10:19 AM   Modules accepted: Orders

## 2021-11-03 NOTE — Progress Notes (Signed)
Routine Prenatal Care Visit  Subjective  Natalie Wu is a 31 y.o. G1P0 at [redacted]w[redacted]d being seen today for ongoing prenatal care.  She is currently monitored for the following issues for this high-risk pregnancy and has Iron deficiency anemia; Situational anxiety; Weight loss counseling, encounter for; Chronic headaches; B12 deficiency; Vitamin D insufficiency; Obesity (BMI 30-39.9); Acute non-recurrent frontal sinusitis; and Supervision of other normal pregnancy, antepartum on their problem list.  ----------------------------------------------------------------------------------- Patient reports no complaints.  Doing well.  Had baby shower this weekend, has all of her baby items. Attempted 3 hour glucose test, vomited within one hour of drinking glucola. She did check her fasting once this weekend it was 77 and a random glucose was 82. She is walking daily and trying to eat a healthy diet, does not eat a lot of sugary foods.  Given 1 hour was 139, not greater 140 will continue with daily walking and a healthy diet.  Sleeping well, mood has been goo-anxiety has been low. Not too interested with IC-is fine with this.  Contractions: Not present.  .  Movement: Present. Leaking Fluid denies.  ----------------------------------------------------------------------------------- The following portions of the patient's history were reviewed and updated as appropriate: allergies, current medications, past family history, past medical history, past social history, past surgical history and problem list. Problem list updated.  Objective  Blood pressure 100/70, weight 219 lb (99.3 kg), last menstrual period 03/26/2021. Pregravid weight 206 lb (93.4 kg) Total Weight Gain 13 lb (5.897 kg) Urinalysis: Urine Protein    Urine Glucose    Fetal Status: Fetal Heart Rate (bpm): 154   Movement: Present     General:  Alert, oriented and cooperative. Patient is in no acute distress.  Skin: Skin is warm and dry. No rash noted.    Cardiovascular: Normal heart rate noted  Respiratory: Normal respiratory effort, no problems with respiration noted  Abdomen: Soft, gravid, appropriate for gestational age. Pain/Pressure: Absent     Pelvic:  Cervical exam deferred        Extremities: Normal range of motion.  Edema: None  Mental Status: Normal mood and affect. Normal behavior. Normal judgment and thought content.   Assessment   30 y.o. G1P0 at [redacted]w[redacted]d by  01/10/2022, by Ultrasound presenting for routine prenatal visit  Plan   pregnancy1 Problems (from 05/08/21 to present)     Problem Noted Resolved   Supervision of other normal pregnancy, antepartum 05/08/2021 by Allen Derry, CNM No   Overview Addendum 08/12/2021  8:34 AM by Allen Derry, [place] Staff Provider  Office Location  [facility] Dating  EDD by 6w u/s  Language  English  Anatomy US  Norm, insuff views  Flu Vaccine  Oct 2022 Genetic Screen  NIPS: negative- xx  TDaP vaccine    Hgb A1C or  GTT Early :5.3 Third trimester :   Covid    LAB RESULTS   Rhogam  NA Blood Type O/Positive/-- (01/17 1033)   Feeding Plan Breast Antibody Negative (01/17 1033)  Contraception  Rubella 2.66 (01/17 1033)  Circumcision NA RPR Non Reactive (01/17 1033)   Pediatrician   HBsAg Negative (01/17 1033)   Support Person Natalie Wu HIV Non Reactive (01/17 1033)  Prenatal Classes  Varicella Immune     GBS  (For PCN allergy, check sensitivities)   BTL Consent     VBAC Consent  Pap ASC-US HPV neg 2020     Hgb Electro    Pelvis Tested  CF  SMA                    Preterm labor symptoms and general obstetric precautions including but not limited to vaginal bleeding, contractions, leaking of fluid and fetal movement were reviewed in detail with the patient. Please refer to After Visit Summary for other counseling recommendations.   Return in about 2 weeks (around 11/17/2021) for [place]. Roberto Scales, CNM  [facility] Pigeon, [facility]  11/03/21   8:32 AM

## 2021-11-14 ENCOUNTER — Ambulatory Visit
Admission: RE | Admit: 2021-11-14 | Discharge: 2021-11-14 | Disposition: A | Discharge status: Home / Self Care | Payer: 59 | Source: Ambulatory Visit | Attending: Licensed Practical Nurse | Admitting: Licensed Practical Nurse

## 2021-11-14 DIAGNOSIS — Z3403 Encounter for supervision of normal first pregnancy, third trimester: Secondary | ICD-10-CM | POA: Diagnosis not present

## 2021-11-14 DIAGNOSIS — Z3A31 31 weeks gestation of pregnancy: Secondary | ICD-10-CM | POA: Diagnosis not present

## 2021-11-14 DIAGNOSIS — Z6837 Body mass index (BMI) 37.0-37.9, adult: Secondary | ICD-10-CM | POA: Insufficient documentation

## 2021-11-14 DIAGNOSIS — O99213 Obesity complicating pregnancy, third trimester: Secondary | ICD-10-CM | POA: Diagnosis not present

## 2021-11-25 ENCOUNTER — Ambulatory Visit (INDEPENDENT_AMBULATORY_CARE_PROVIDER_SITE_OTHER): Payer: 59 | Admitting: Licensed Practical Nurse

## 2021-11-25 ENCOUNTER — Encounter: Payer: Self-pay | Admitting: Licensed Practical Nurse

## 2021-11-25 VITALS — BP 110/80 | Wt 224.0 lb

## 2021-11-25 DIAGNOSIS — Z3A33 33 weeks gestation of pregnancy: Secondary | ICD-10-CM

## 2021-11-25 DIAGNOSIS — Z3483 Encounter for supervision of other normal pregnancy, third trimester: Secondary | ICD-10-CM | POA: Diagnosis not present

## 2021-11-25 DIAGNOSIS — Z3480 Encounter for supervision of other normal pregnancy, unspecified trimester: Secondary | ICD-10-CM

## 2021-11-25 DIAGNOSIS — Z3482 Encounter for supervision of other normal pregnancy, second trimester: Secondary | ICD-10-CM | POA: Diagnosis not present

## 2021-11-28 DIAGNOSIS — M5489 Other dorsalgia: Secondary | ICD-10-CM | POA: Diagnosis not present

## 2021-12-04 ENCOUNTER — Ambulatory Visit (HOSPITAL_BASED_OUTPATIENT_CLINIC_OR_DEPARTMENT_OTHER): Payer: 59 | Admitting: Obstetrics

## 2021-12-04 ENCOUNTER — Ambulatory Visit (INDEPENDENT_AMBULATORY_CARE_PROVIDER_SITE_OTHER): Payer: 59 | Admitting: Obstetrics & Gynecology

## 2021-12-04 ENCOUNTER — Other Ambulatory Visit: Payer: Self-pay

## 2021-12-04 ENCOUNTER — Encounter: Payer: Self-pay | Admitting: *Deleted

## 2021-12-04 ENCOUNTER — Ambulatory Visit: Payer: 59 | Attending: Obstetrics

## 2021-12-04 VITALS — BP 122/80 | Temp 97.8°F

## 2021-12-04 VITALS — BP 162/109 | HR 120 | Temp 98.6°F

## 2021-12-04 DIAGNOSIS — O4103X1 Oligohydramnios, third trimester, fetus 1: Secondary | ICD-10-CM | POA: Diagnosis not present

## 2021-12-04 DIAGNOSIS — O4103X0 Oligohydramnios, third trimester, not applicable or unspecified: Secondary | ICD-10-CM

## 2021-12-04 DIAGNOSIS — Z3A34 34 weeks gestation of pregnancy: Secondary | ICD-10-CM | POA: Insufficient documentation

## 2021-12-04 DIAGNOSIS — O99013 Anemia complicating pregnancy, third trimester: Secondary | ICD-10-CM

## 2021-12-04 DIAGNOSIS — O359XX0 Maternal care for (suspected) fetal abnormality and damage, unspecified, not applicable or unspecified: Secondary | ICD-10-CM | POA: Diagnosis not present

## 2021-12-04 DIAGNOSIS — D649 Anemia, unspecified: Secondary | ICD-10-CM | POA: Insufficient documentation

## 2021-12-04 DIAGNOSIS — E669 Obesity, unspecified: Secondary | ICD-10-CM | POA: Diagnosis not present

## 2021-12-04 DIAGNOSIS — Z363 Encounter for antenatal screening for malformations: Secondary | ICD-10-CM | POA: Diagnosis not present

## 2021-12-04 DIAGNOSIS — O368390 Maternal care for abnormalities of the fetal heart rate or rhythm, unspecified trimester, not applicable or unspecified: Secondary | ICD-10-CM | POA: Diagnosis not present

## 2021-12-04 DIAGNOSIS — O99213 Obesity complicating pregnancy, third trimester: Secondary | ICD-10-CM | POA: Diagnosis not present

## 2021-12-04 DIAGNOSIS — Z3480 Encounter for supervision of other normal pregnancy, unspecified trimester: Secondary | ICD-10-CM

## 2021-12-04 NOTE — Progress Notes (Signed)
MFM Note  Natalie Wu was seen at the request of Dr. Marice Potter as oligohydramnios was noted on a bedside ultrasound that was performed during a routine prenatal visit today.  The patient denies any problems in her current pregnancy and denies any leakage of fluid.  She was ruled out for rupture of membranes in the office.  Dr. Marice Potter reported that the patient had a reactive NST in the office.  However, the fetal heart rate was in the 170s range.  On today's exam, the overall EFW of 5 pounds 5 ounces measures at the 34th percentile for her gestational age.    Normal amniotic fluid with a total AFI of 18.43 cm was noted.    A biophysical profile performed today was 8 out of 8.  The patient was noted to have elevated blood pressures of 158/108, 151/105, and 162/109 during her ultrasound exam today.  Her blood pressure was 122/80 earlier in the day during her prenatal visit.  She denies any signs or symptoms of preeclampsia.    The patient reports that her blood pressures are high probably due to anxiety from the finding of oligohydramnios.  As the fetal growth and amniotic fluid level are within normal limits today, she should have an NST and blood pressure check performed in the office tomorrow.  The patient works at the [employer].  She should have another amniotic fluid check performed in your office next week.  The patient stated that all of her questions were answered today.    No further exams were scheduled in our office.  We would be happy to see her again in the future if necessary.  A total of 30 minutes was spent counseling and coordinating the care for this patient.  Greater than 50% of the time was spent in direct face-to-face contact.

## 2021-12-04 NOTE — Progress Notes (Signed)
Subjective:    Natalie Wu is a 31 y.o. G1 being seen today for her obstetrical visit. 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 I obliged and noted oligohydramnios. She denies any ROM, contractions, or bleeding. Her pregnancy has been uncomplicated.   Review of Systems:   Review of Systems   Objective:    BP 122/80   Temp 97.8 F (36.6 C)   LMP 03/26/2021   Physical Exam  Exam   Well nourished, well hydrated White female, no apparent distress She is ambulating and conversing normally. SSE- only a normal white discharge Negative pool, valsalva, fern, and nitrazine Her temperature is normal as is her BP. NST reactive but baseline between 160s and 170s.    Assessment:    Pregnancy:  G1P0- oligohydramnios by bedside ultrasound and fetal tachycardia. I have placed an order for MFM consult, probable BPP. I spoke with Dr. Logan Bores who concurs.    Plan:    Patient Active Problem List   Diagnosis Date Noted  . Supervision of other normal pregnancy, antepartum 05/08/2021  . Acute non-recurrent frontal sinusitis 04/11/2021  . Obesity (BMI 30-39.9) 12/27/2020  . B12 deficiency 10/08/2020  . Vitamin D insufficiency 10/08/2020  . Chronic headaches 03/20/2019  . Weight loss counseling, encounter for 08/02/2018  . Situational anxiety 09/20/2017  . Iron deficiency anemia 06/27/2015

## 2021-12-05 ENCOUNTER — Ambulatory Visit (INDEPENDENT_AMBULATORY_CARE_PROVIDER_SITE_OTHER): Payer: 59 | Admitting: Obstetrics and Gynecology

## 2021-12-05 ENCOUNTER — Encounter: Payer: Self-pay | Admitting: Obstetrics and Gynecology

## 2021-12-05 VITALS — BP 140/100 | HR 100 | Wt 229.0 lb

## 2021-12-05 DIAGNOSIS — O163 Unspecified maternal hypertension, third trimester: Secondary | ICD-10-CM | POA: Diagnosis not present

## 2021-12-05 DIAGNOSIS — Z3A34 34 weeks gestation of pregnancy: Secondary | ICD-10-CM | POA: Diagnosis not present

## 2021-12-05 DIAGNOSIS — Z3480 Encounter for supervision of other normal pregnancy, unspecified trimester: Secondary | ICD-10-CM

## 2021-12-05 NOTE — Progress Notes (Signed)
Patient suddenly had an elevation in blood pressure noted yesterday.  She presents today for follow-up.  Blood pressures are still elevated in the nonsevere range.  NST reactive with spontaneous CST negative.  We will draw preeclamptic labs.  Unless labs indicate, she will go home and have a stressfree restful weekend and recheck blood pressures on Monday.

## 2021-12-06 LAB — CBC
Hematocrit: 31.6 % — ABNORMAL LOW (ref 34.0–46.6)
Hemoglobin: 9.7 g/dL — ABNORMAL LOW (ref 11.1–15.9)
MCH: 23.9 pg — ABNORMAL LOW (ref 26.6–33.0)
MCHC: 30.7 g/dL — ABNORMAL LOW (ref 31.5–35.7)
MCV: 78 fL — ABNORMAL LOW (ref 79–97)
Platelets: 306 10*3/uL (ref 150–450)
RBC: 4.06 x10E6/uL (ref 3.77–5.28)
RDW: 14.9 % (ref 11.7–15.4)
WBC: 9.6 10*3/uL (ref 3.4–10.8)

## 2021-12-06 LAB — PROTEIN / CREATININE RATIO, URINE
Creatinine, Urine: 91 mg/dL
Protein, Ur: 14.5 mg/dL
Protein/Creat Ratio: 159 mg/g creat (ref 0–200)

## 2021-12-06 LAB — COMPREHENSIVE METABOLIC PANEL
ALT: 9 IU/L (ref 0–32)
AST: 14 IU/L (ref 0–40)
Albumin/Globulin Ratio: 1.3 (ref 1.2–2.2)
Albumin: 3.3 g/dL — ABNORMAL LOW (ref 3.9–5.0)
Alkaline Phosphatase: 191 IU/L — ABNORMAL HIGH (ref 44–121)
BUN/Creatinine Ratio: 5 — ABNORMAL LOW (ref 9–23)
BUN: 3 mg/dL — ABNORMAL LOW (ref 6–20)
Bilirubin Total: <0.2 mg/dL (ref 0.0–1.2)
CO2: 19 mmol/L — ABNORMAL LOW (ref 20–29)
Calcium: 8.6 mg/dL — ABNORMAL LOW (ref 8.7–10.2)
Chloride: 101 mmol/L (ref 96–106)
Creatinine, Ser: 0.59 mg/dL (ref 0.57–1.00)
Globulin, Total: 2.5 g/dL (ref 1.5–4.5)
Glucose: 106 mg/dL — ABNORMAL HIGH (ref 70–99)
Potassium: 3.9 mmol/L (ref 3.5–5.2)
Sodium: 136 mmol/L (ref 134–144)
Total Protein: 5.8 g/dL — ABNORMAL LOW (ref 6.0–8.5)
eGFR: 125 mL/min/{1.73_m2} (ref >59)

## 2021-12-08 ENCOUNTER — Telehealth: Payer: Self-pay | Admitting: Licensed Practical Nurse

## 2021-12-08 ENCOUNTER — Ambulatory Visit (INDEPENDENT_AMBULATORY_CARE_PROVIDER_SITE_OTHER): Payer: 59 | Admitting: Obstetrics & Gynecology

## 2021-12-08 VITALS — BP 130/92 | Wt 229.0 lb

## 2021-12-08 DIAGNOSIS — O368390 Maternal care for abnormalities of the fetal heart rate or rhythm, unspecified trimester, not applicable or unspecified: Secondary | ICD-10-CM

## 2021-12-08 DIAGNOSIS — Z3A35 35 weeks gestation of pregnancy: Secondary | ICD-10-CM

## 2021-12-08 DIAGNOSIS — O163 Unspecified maternal hypertension, third trimester: Secondary | ICD-10-CM

## 2021-12-08 LAB — COMPREHENSIVE METABOLIC PANEL
ALT: 14 IU/L (ref 0–32)
AST: 13 IU/L (ref 0–40)
Albumin/Globulin Ratio: 1.2 (ref 1.2–2.2)
Albumin: 3.1 g/dL — ABNORMAL LOW (ref 4.0–5.0)
Alkaline Phosphatase: 183 IU/L — ABNORMAL HIGH (ref 44–121)
BUN/Creatinine Ratio: 5 — ABNORMAL LOW (ref 9–23)
BUN: 3 mg/dL — ABNORMAL LOW (ref 6–20)
Bilirubin Total: <0.2 mg/dL (ref 0.0–1.2)
CO2: 21 mmol/L (ref 20–29)
Calcium: 8.6 mg/dL — ABNORMAL LOW (ref 8.7–10.2)
Chloride: 103 mmol/L (ref 96–106)
Creatinine, Ser: 0.59 mg/dL (ref 0.57–1.00)
Globulin, Total: 2.5 g/dL (ref 1.5–4.5)
Glucose: 87 mg/dL (ref 70–99)
Potassium: 4 mmol/L (ref 3.5–5.2)
Sodium: 137 mmol/L (ref 134–144)
Total Protein: 5.6 g/dL — ABNORMAL LOW (ref 6.0–8.5)
eGFR: 125 mL/min/{1.73_m2} (ref >59)

## 2021-12-08 LAB — CBC
Hematocrit: 29.9 % — ABNORMAL LOW (ref 34.0–46.6)
Hemoglobin: 9.4 g/dL — ABNORMAL LOW (ref 11.1–15.9)
MCH: 23.8 pg — ABNORMAL LOW (ref 26.6–33.0)
MCHC: 31.4 g/dL — ABNORMAL LOW (ref 31.5–35.7)
MCV: 76 fL — ABNORMAL LOW (ref 79–97)
Platelets: 297 10*3/uL (ref 150–450)
RBC: 3.95 x10E6/uL (ref 3.77–5.28)
RDW: 15 % (ref 11.7–15.4)
WBC: 10.5 10*3/uL (ref 3.4–10.8)

## 2021-12-08 LAB — POCT URINALYSIS DIPSTICK OB
Glucose, UA: NEGATIVE
POC,PROTEIN,UA: NEGATIVE

## 2021-12-08 NOTE — Telephone Encounter (Signed)
Received call from [place] regarding abnormal labs. Unable to obtain UPC ratio results at the time. Results pending  Pt known to be anemic, takes a PNV but stopped supplement d/t it being constipating, has needed Iron transfusions in the past.  Liver enzymes and Platelets WNL Discussed results with pt, some values suggest you may not being consuming enough protein, pt reports she eats chicken and very little dairy.  -rec starting Floradix for iron supplement, will discuss with MD to see if Iron transfusion is needed, start daily calcium supplement, increase daily protein intake  -reviewed weight gain, pt gained 23lbs total, 5lbs since 6/27. Pt reports occasional episodes of vomiting in the evening, reviewed weight gain could be water rentention.    Latest Reference Range & Units 12/08/21 11:30  Sodium 134 - 144 mmol/L 137  Potassium 3.5 - 5.2 mmol/L 4.0  Chloride 96 - 106 mmol/L 103  CO2 20 - 29 mmol/L 21  Glucose 70 - 99 mg/dL 87  BUN 6 - 20 mg/dL 3 (L)  Creatinine 0.57 - 1.00 mg/dL 0.59  Calcium 8.7 - 10.2 mg/dL 8.6 (L)  BUN/Creatinine Ratio 9 - 23  5 (L)  eGFR >59 mL/min/1.73 125  Alkaline Phosphatase 44 - 121 IU/L 183 (H)  Albumin 4.0 - 5.0 g/dL 3.1 (L)  Albumin/Globulin Ratio 1.2 - 2.2  1.2  AST 0 - 40 IU/L 13  ALT 0 - 32 IU/L 14  Total Protein 6.0 - 8.5 g/dL 5.6 (L)  Total Bilirubin 0.0 - 1.2 mg/dL <0.2  (L): Data is abnormally low (H): Data is abnormally high  Latest Reference Range & Units 12/08/21 11:30  WBC 3.4 - 10.8 x10E3/uL 10.5  RBC 3.77 - 5.28 x10E6/uL 3.95  Hemoglobin 11.1 - 15.9 g/dL 9.4 (L)  HCT 34.0 - 46.6 % 29.9 (L)  MCV 79 - 97 fL 76 (L)  MCH 26.6 - 33.0 pg 23.8 (L)  MCHC 31.5 - 35.7 g/dL 31.4 (L)  RDW 11.7 - 15.4 % 15.0  Platelets 150 - 450 x10E3/uL 297  (L): Data is abnormally low  Roberto Scales, CNM  [facility], [facility]  '@TODAY' @  6:07 PM

## 2021-12-09 ENCOUNTER — Encounter: Payer: Self-pay | Admitting: Obstetrics & Gynecology

## 2021-12-09 ENCOUNTER — Ambulatory Visit: Payer: 59 | Admitting: Obstetrics and Gynecology

## 2021-12-09 ENCOUNTER — Observation Stay
Admission: RE | Admit: 2021-12-09 | Discharge: 2021-12-09 | Disposition: A | Discharge status: Home / Self Care | Payer: 59 | Attending: Obstetrics | Admitting: Obstetrics

## 2021-12-09 ENCOUNTER — Encounter: Payer: Self-pay | Admitting: Obstetrics and Gynecology

## 2021-12-09 ENCOUNTER — Other Ambulatory Visit: Payer: Self-pay

## 2021-12-09 ENCOUNTER — Encounter: Payer: Self-pay | Admitting: Obstetrics

## 2021-12-09 ENCOUNTER — Other Ambulatory Visit: Payer: Self-pay | Admitting: Obstetrics and Gynecology

## 2021-12-09 ENCOUNTER — Ambulatory Visit (HOSPITAL_BASED_OUTPATIENT_CLINIC_OR_DEPARTMENT_OTHER): Payer: 59

## 2021-12-09 VITALS — BP 145/99 | HR 131 | Temp 97.8°F | Ht 63.0 in | Wt 229.5 lb

## 2021-12-09 DIAGNOSIS — O359XX0 Maternal care for (suspected) fetal abnormality and damage, unspecified, not applicable or unspecified: Secondary | ICD-10-CM | POA: Diagnosis not present

## 2021-12-09 DIAGNOSIS — E669 Obesity, unspecified: Secondary | ICD-10-CM

## 2021-12-09 DIAGNOSIS — Z3480 Encounter for supervision of other normal pregnancy, unspecified trimester: Secondary | ICD-10-CM

## 2021-12-09 DIAGNOSIS — O10913 Unspecified pre-existing hypertension complicating pregnancy, third trimester: Secondary | ICD-10-CM | POA: Insufficient documentation

## 2021-12-09 DIAGNOSIS — Z3A35 35 weeks gestation of pregnancy: Secondary | ICD-10-CM | POA: Insufficient documentation

## 2021-12-09 DIAGNOSIS — O99891 Other specified diseases and conditions complicating pregnancy: Secondary | ICD-10-CM | POA: Diagnosis not present

## 2021-12-09 DIAGNOSIS — O368330 Maternal care for abnormalities of the fetal heart rate or rhythm, third trimester, not applicable or unspecified: Secondary | ICD-10-CM | POA: Insufficient documentation

## 2021-12-09 DIAGNOSIS — O133 Gestational [pregnancy-induced] hypertension without significant proteinuria, third trimester: Secondary | ICD-10-CM | POA: Diagnosis not present

## 2021-12-09 DIAGNOSIS — O99213 Obesity complicating pregnancy, third trimester: Secondary | ICD-10-CM

## 2021-12-09 DIAGNOSIS — O163 Unspecified maternal hypertension, third trimester: Secondary | ICD-10-CM

## 2021-12-09 DIAGNOSIS — R000 Tachycardia, unspecified: Secondary | ICD-10-CM | POA: Insufficient documentation

## 2021-12-09 LAB — PROTEIN / CREATININE RATIO, URINE
Creatinine, Urine: 110.2 mg/dL
Creatinine, Urine: 62 mg/dL
Protein Creatinine Ratio: 0.23 mg/mg{Cre} — ABNORMAL HIGH (ref 0.00–0.15)
Protein, Ur: 23.1 mg/dL
Protein/Creat Ratio: 210 mg/g creat — ABNORMAL HIGH (ref 0–200)
Total Protein, Urine: 14 mg/dL

## 2021-12-09 MED ORDER — HYDROXYZINE HCL 25 MG PO TABS
25.0000 mg | ORAL_TABLET | ORAL | Status: AC
  Administered 2021-12-09: 25 mg via ORAL
  Filled 2021-12-09: qty 1

## 2021-12-09 MED ORDER — LABETALOL HCL 200 MG PO TABS: 100.0000 mg | ORAL_TABLET | Freq: Two times a day (BID) | ORAL | 3 refills | Status: DC

## 2021-12-09 MED ORDER — LACTATED RINGERS IV BOLUS
500.0000 mL | Freq: Once | INTRAVENOUS | Status: AC
Start: 2021-12-09 — End: 2021-12-09
  Administered 2021-12-09: 500 mL via INTRAVENOUS

## 2021-12-09 MED ORDER — ACETAMINOPHEN 325 MG PO TABS: 650.0000 mg | ORAL_TABLET | ORAL | Status: DC | PRN

## 2021-12-09 NOTE — OB Triage Note (Signed)
LABOR & DELIVERY OB TRIAGE NOTE  SUBJECTIVE  HPI Natalie Wu is a 31 y.o. G1P0 at [redacted]w[redacted]d who presents to Labor & Delivery for fetal tachycardia and maternal tachycardia and HTN. She has been noted to have elevated BPs in the office recently. She was seen by MFM for a BPP and NST. Score was 8/10 (no fetal breathing movements). Natalie Wu's HR was 130-140 and her BPs were 140s/90s. Her NST was reactive with fetal tachycardia. She was sent to triage for further evaluation. She denies HA, visual changes, and RUQ pain.  OB History     Gravida  1   Para      Term      Preterm      AB      Living         SAB      IAB      Ectopic      Multiple      Live Births              Scheduled Meds: Continuous Infusions: PRN Meds:.acetaminophen  OBJECTIVE  BP (!) 159/106   Pulse (!) 105   Temp 98.8 F (37.1 C) (Oral)   Resp 18   Ht 5\' 3"  (1.6 m)   Wt 104 kg   LMP 03/26/2021   BMI 40.61 kg/m   Cardiac: RRR Lungs: CTAB Abdomen: Soft, gravid, non-tender DTRs: 2+, no clonus  NST I reviewed the NST and it was reactive.  Baseline: 150 Variability: moderate Accelerations: present Decelerations:none Toco: irritability Category 1  ASSESSMENT Impression  1) Pregnancy at G1P0, [redacted]w[redacted]d, Estimated Date of Delivery: 01/10/22 2) Reassuring maternal/fetal status 3) Gestational hypertension  PLAN  1) Start labetalol 100 PO BID. Discussed proper dosing and potential side effects. 2) Monitor BPs at home. Encouraged to rest this week and stay out of work. 3) Discharge instructions with standard return/labor precautions. Reviewed danger signs of headache, visual changes, and RUQ pain.  4) Keep scheduled ROB.   03/12/22, CNM

## 2021-12-09 NOTE — OB Triage Note (Signed)
Pt discharged home per order.   Pt stable and ambulatory and an After Visit Summary was printed and given to the patient. Discharge education completed with patient/family including follow up instructions, appointments, and medication list. Pt received labor and bleeding precautions. Patient able to verbalize understanding, all questions fully answered upon discharge. Patient instructed to return to ED, call 911, or call MD for any changes in condition. Pt discharged home via personal vehicle with support person.   Labetalol prescription sent to pharmacy.

## 2021-12-09 NOTE — Procedures (Signed)
Natalie Wu 1990-12-16 [redacted]w[redacted]d  Fetus A Non-Stress Test Interpretation for 12/09/21  Indication: Chronic Hypertenstion  Fetal Heart Rate A Mode: External Baseline Rate (A): 160 bpm Variability: Moderate Accelerations: 15 x 15 Decelerations: None Multiple birth?: No  Uterine Activity Mode: Toco  Interpretation (Fetal Testing) Nonstress Test Interpretation: Reactive (Per Dr. Judeth Cornfield)

## 2021-12-09 NOTE — Progress Notes (Signed)
Pt presents to L/D triage from maternal fetal medicine appt for elevated BP in office and both maternal/fetal tachycardia.  Pt reports no fetal concerns at this time- no bleeding, LOF, or contraction and positive fetal movement detected.  Pt reports no PIH symptoms- no atypical edema noted. +2 reflexes, no clonus.  Monitors applied and assessing. Initial FHT 165.  Initial BP 152/105. Cycling q15 min. Pt reports anxiety.  Pulse in 90s.

## 2021-12-09 NOTE — Progress Notes (Signed)
Subjective:    Natalie Wu is a 31 y.o. female being seen today for her obstetrical visit. She is at [redacted]w[redacted]d gestation. Patient reports about 2 weeks of intermittent vomitting. This is a new symptom. She denies specific reflux or RUQ pain. She denies visual changes.  Fetal movement: normal.  Review of Systems:   Review of Systems   Objective:    BP (!) 130/92   Wt 229 lb (103.9 kg)   LMP 03/26/2021   BMI 39.10 kg/m   Physical Exam  Exam  Well nourished, well hydrated White female, no apparent distress She is ambulating and conversing normally. 3+ pitting edema DTRs- 1+ bilaterally  FHT:  170s BPM, NST reactive, category 1 but baseline is 170  Uterine Size: 35 cm  Presentation: cephalic     Assessment:    Pregnancy:  G1P0    Plan:    Patient Active Problem List   Diagnosis Date Noted  . Supervision of other normal pregnancy, antepartum 05/08/2021  . Acute non-recurrent frontal sinusitis 04/11/2021  . Obesity (BMI 30-39.9) 12/27/2020  . B12 deficiency 10/08/2020  . Vitamin D insufficiency 10/08/2020  . Chronic headaches 03/20/2019  . Weight loss counseling, encounter for 08/02/2018  . Situational anxiety 09/20/2017  . Iron deficiency anemia 06/27/2015    Elevated BP in third trimester- check stat labs, BPP tomorrow, Pre eclampsia precautions reviewed.

## 2021-12-11 NOTE — Addendum Note (Signed)
Addended by: Kathlene Cote on: 12/11/2021 11:37 AM   Modules accepted: Orders

## 2021-12-11 NOTE — Addendum Note (Signed)
Addended by: Kathlene Cote on: 12/11/2021 11:36 AM   Modules accepted: Orders

## 2021-12-11 NOTE — Addendum Note (Signed)
Addended by: Kathlene Cote on: 12/11/2021 11:30 AM   Modules accepted: Orders

## 2021-12-12 ENCOUNTER — Ambulatory Visit (INDEPENDENT_AMBULATORY_CARE_PROVIDER_SITE_OTHER): Payer: 59 | Admitting: Licensed Practical Nurse

## 2021-12-12 ENCOUNTER — Encounter: Payer: Self-pay | Admitting: Licensed Practical Nurse

## 2021-12-12 VITALS — BP 124/80 | HR 101 | Wt 229.0 lb

## 2021-12-12 DIAGNOSIS — O133 Gestational [pregnancy-induced] hypertension without significant proteinuria, third trimester: Secondary | ICD-10-CM

## 2021-12-12 DIAGNOSIS — Z3685 Encounter for antenatal screening for Streptococcus B: Secondary | ICD-10-CM | POA: Diagnosis not present

## 2021-12-12 DIAGNOSIS — Z3480 Encounter for supervision of other normal pregnancy, unspecified trimester: Secondary | ICD-10-CM

## 2021-12-12 LAB — POCT URINALYSIS DIPSTICK OB
Glucose, UA: NEGATIVE
POC,PROTEIN,UA: NEGATIVE

## 2021-12-12 NOTE — Progress Notes (Unsigned)
Routine Prenatal Care Visit  Subjective  Natalie Wu is a 31 y.o. G1P0 at [redacted]w[redacted]d being seen today for ongoing prenatal care.  She is currently monitored for the following issues for this high-risk pregnancy and has Iron deficiency anemia; Situational anxiety; Weight loss counseling, encounter for; Chronic headaches; B12 deficiency; Vitamin D insufficiency; Obesity (BMI 30-39.9); Acute non-recurrent frontal sinusitis; Supervision of other normal pregnancy, antepartum; and Labor and delivery, indication for care on their problem list.  ----------------------------------------------------------------------------------- Patient reports Doing ok.  Has been seen recently for [facility], now on Labetalol BID.  Endorse+FM. Hs been monitoring BP at home, BP 135-150's, 89-101, she did bring in her home cuff cuff correlates with our cuff.  Has had had an occasional headache, byt the have been mild and go away. Plans to work until 37 weeks and then go out on [employer]. -Reviewed plan of care with Dr Valentino Saxon, as long as as BP are not severe and there is no preeclampsia, induce at 38 wks.  -Mood has been good, anxiety is is under control, feels comfortable with current plan Contractions: Not present. Vag. Bleeding: None.  Movement: Present. Leaking Fluid denies.  ----------------------------------------------------------------------------------- The following portions of the patient's history were reviewed and updated as appropriate: allergies, current medications, past family history, past medical history, past social history, past surgical history and problem list. Problem list updated.  Objective  Blood pressure 124/80, pulse (!) 101, weight 229 lb (103.9 kg), last menstrual period 03/26/2021. Pregravid weight 206 lb (93.4 kg) Total Weight Gain 23 lb (10.4 kg) Urinalysis: Urine Protein Negative  Urine Glucose Negative  Fetal Status: Fetal Heart Rate (bpm): 155   Movement: Present     General:  Alert, oriented and  cooperative. Patient is in no acute distress.  Skin: Skin is warm and dry. No rash noted.   Cardiovascular: Normal heart rate noted  Respiratory: Normal respiratory effort, no problems with respiration noted  Abdomen: Soft, gravid, appropriate for gestational age. Pain/Pressure: Absent     Pelvic:  Cervical exam deferred        Extremities: Normal range of motion.  Edema: Trace  Mental Status: Normal mood and affect. Normal behavior. Normal judgment and thought content.   Assessment   31 y.o. G1P0 at [redacted]w[redacted]d by  01/10/2022, by Ultrasound presenting for routine prenatal visit  Plan   pregnancy1 Problems (from 05/08/21 to present)     Problem Noted Resolved   Supervision of other normal pregnancy, antepartum 05/08/2021 by Ellwood Sayers, CNM No   Overview Addendum 11/25/2021  8:29 AM by Ellwood Sayers, CNM     Nursing Staff Provider  Office Location  [facility] Dating  EDD by 6w u/s  Language  English  Anatomy US  Norm, insuff views, norm fu  Flu Vaccine  Oct 2022 Genetic Screen  NIPS: negative- xx  TDaP vaccine   11/03/21 Hgb A1C or  GTT Early :5.3 Third trimester : 139  Covid Vaccinated    LAB RESULTS   Rhogam  NA Blood Type O/Positive/-- (01/17 1033)   Feeding Plan Breast Antibody Negative (01/17 1033)  Contraception POP Rubella 2.66 (01/17 1033)  Circumcision NA RPR Non Reactive (01/17 1033)   Pediatrician  [facility] HBsAg Negative (01/17 1033)   Support Person Ronaldo Miyamoto HIV Non Reactive (01/17 1033)  Prenatal Classes Reading  Varicella Immune     GBS  (For PCN allergy, check sensitivities)   BTL Consent     VBAC Consent  Pap ASC-US HPV neg 2020  Hgb Electro    Pelvis Tested  CF      SMA                    Preterm labor symptoms and general obstetric precautions including but not limited to vaginal bleeding, contractions, leaking of fluid and fetal movement were reviewed in detail with the patient. Please refer to After Visit Summary for other counseling  recommendations.   Return for MFM tues, ROB Thurs.  Needs

## 2021-12-16 ENCOUNTER — Observation Stay
Admission: AD | Admit: 2021-12-16 | Discharge: 2021-12-16 | Disposition: A | Discharge status: Home / Self Care | Payer: 59 | Attending: Obstetrics | Admitting: Obstetrics

## 2021-12-16 ENCOUNTER — Encounter: Payer: Self-pay | Admitting: Obstetrics

## 2021-12-16 ENCOUNTER — Other Ambulatory Visit: Payer: Self-pay

## 2021-12-16 ENCOUNTER — Ambulatory Visit (HOSPITAL_BASED_OUTPATIENT_CLINIC_OR_DEPARTMENT_OTHER): Payer: 59

## 2021-12-16 DIAGNOSIS — E669 Obesity, unspecified: Secondary | ICD-10-CM | POA: Diagnosis not present

## 2021-12-16 DIAGNOSIS — O133 Gestational [pregnancy-induced] hypertension without significant proteinuria, third trimester: Secondary | ICD-10-CM

## 2021-12-16 DIAGNOSIS — O99213 Obesity complicating pregnancy, third trimester: Secondary | ICD-10-CM

## 2021-12-16 DIAGNOSIS — O1213 Gestational proteinuria, third trimester: Secondary | ICD-10-CM | POA: Insufficient documentation

## 2021-12-16 DIAGNOSIS — Z3A36 36 weeks gestation of pregnancy: Secondary | ICD-10-CM | POA: Insufficient documentation

## 2021-12-16 DIAGNOSIS — O4103X0 Oligohydramnios, third trimester, not applicable or unspecified: Secondary | ICD-10-CM | POA: Insufficient documentation

## 2021-12-16 DIAGNOSIS — Z3480 Encounter for supervision of other normal pregnancy, unspecified trimester: Secondary | ICD-10-CM

## 2021-12-16 DIAGNOSIS — O358XX0 Maternal care for other (suspected) fetal abnormality and damage, not applicable or unspecified: Secondary | ICD-10-CM | POA: Diagnosis not present

## 2021-12-16 DIAGNOSIS — O163 Unspecified maternal hypertension, third trimester: Secondary | ICD-10-CM | POA: Diagnosis present

## 2021-12-16 LAB — COMPREHENSIVE METABOLIC PANEL
ALT: 15 U/L (ref 0–44)
AST: 18 U/L (ref 15–41)
Albumin: 2.7 g/dL — ABNORMAL LOW (ref 3.5–5.0)
Alkaline Phosphatase: 168 U/L — ABNORMAL HIGH (ref 38–126)
Anion gap: 5 (ref 5–15)
BUN: <5 mg/dL — ABNORMAL LOW (ref 6–20)
CO2: 22 mmol/L (ref 22–32)
Calcium: 8.2 mg/dL — ABNORMAL LOW (ref 8.9–10.3)
Chloride: 109 mmol/L (ref 98–111)
Creatinine, Ser: 0.63 mg/dL (ref 0.44–1.00)
GFR, Estimated: >60 mL/min (ref >60)
Glucose, Bld: 75 mg/dL (ref 70–99)
Potassium: 4.2 mmol/L (ref 3.5–5.1)
Sodium: 136 mmol/L (ref 135–145)
Total Bilirubin: 0.3 mg/dL (ref 0.3–1.2)
Total Protein: 6.3 g/dL — ABNORMAL LOW (ref 6.5–8.1)

## 2021-12-16 LAB — CBC
HCT: 29.6 % — ABNORMAL LOW (ref 36.0–46.0)
Hemoglobin: 9.2 g/dL — ABNORMAL LOW (ref 12.0–15.0)
MCH: 23.8 pg — ABNORMAL LOW (ref 26.0–34.0)
MCHC: 31.1 g/dL (ref 30.0–36.0)
MCV: 76.5 fL — ABNORMAL LOW (ref 80.0–100.0)
Platelets: 292 10*3/uL (ref 150–400)
RBC: 3.87 MIL/uL (ref 3.87–5.11)
RDW: 16 % — ABNORMAL HIGH (ref 11.5–15.5)
WBC: 10.5 10*3/uL (ref 4.0–10.5)
nRBC: 0.2 % (ref 0.0–0.2)

## 2021-12-16 LAB — PROTEIN / CREATININE RATIO, URINE
Creatinine, Urine: 33 mg/dL
Total Protein, Urine: <6 mg/dL

## 2021-12-16 LAB — CULTURE, BETA STREP (GROUP B ONLY): Strep Gp B Culture: NEGATIVE

## 2021-12-16 MED ORDER — ACETAMINOPHEN 325 MG PO TABS: 650.0000 mg | ORAL_TABLET | ORAL | Status: DC | PRN

## 2021-12-16 MED ORDER — HYDROXYZINE HCL 25 MG PO TABS: 50.0000 mg | ORAL_TABLET | Freq: Four times a day (QID) | ORAL | Status: DC | PRN

## 2021-12-16 MED ORDER — BETAMETHASONE SOD PHOS & ACET 6 (3-3) MG/ML IJ SUSP
12.0000 mg | INTRAMUSCULAR | Status: DC
  Administered 2021-12-16: 12 mg via INTRAMUSCULAR

## 2021-12-16 MED ORDER — BETAMETHASONE SOD PHOS & ACET 6 (3-3) MG/ML IJ SUSP
INTRAMUSCULAR | Status: AC
  Filled 2021-12-16: qty 5

## 2021-12-16 NOTE — Progress Notes (Signed)
Pt discharged home per order.  Pt stable and ambulatory and an After Visit Summary was printed and given to the patient. Discharge education completed with patient/family including follow up instructions, appointments, and medication list. Pt received labor and bleeding precautions. Patient able to verbalize understanding, all questions fully answered upon discharge. Patient instructed to return to ED, call 911, or call MD for any changes in condition.   Pt declined stay- sent home with 24 hr urine collection. Will return tomorrow for NST with collection.

## 2021-12-16 NOTE — OB Triage Note (Signed)
LABOR & DELIVERY OB TRIAGE NOTE  SUBJECTIVE  HPI Natalie Wu is a 31 y.o. G1P0 at [redacted]w[redacted]d who presents to Labor & Delivery for elevated BPs at her MFM appointment. She was noted to have some severe range pressures. She denies headache, visual changes, and epigastric pain. I spoke to Dr. Judeth Cornfield, who recommended evaluating her in triage and if BPs remain elevated, consider IOL now, and if BPs normalize, consider IOL at 37 weeks.  OB History     Gravida  1   Para      Term      Preterm      AB      Living         SAB      IAB      Ectopic      Multiple      Live Births              Scheduled Meds: Continuous Infusions: PRN Meds:.acetaminophen, hydrOXYzine  OBJECTIVE  BP 131/81   Temp 99.1 F (37.3 C) (Oral)   Resp 18   Ht 5\' 3"  (1.6 m)   Wt 105.7 kg   LMP 03/26/2021   BMI 41.27 kg/m   NST I reviewed the NST and it was reactive.  Baseline: 155 Variability: moderate Accelerations: present Decelerations:none Toco: irritability Category 1  ASSESSMENT Impression  1) Pregnancy at G1P0, [redacted]w[redacted]d, Estimated Date of Delivery: 01/10/22. Reassuring maternal/fetal status. 2) Gestational hypertension  PLAN  1) Monitor in triage 2) Preeclampsia labs  03/12/22, CNM

## 2021-12-16 NOTE — Progress Notes (Signed)
In to see patient after discussion with Quitman Livings. Patient is 31 yo G1 at [redacted]w[redacted]d with GHTN, currently on labetolol 100 BID, previously with oligo. Patient with 2 high range pressures here, remaining mostly mild range few normal range. Most recent pressure 133/83. Normal platelets and LFTs. PC ratio too low to calculate. Patient was in for BPP today with elevated pressures and MFM recommendation was for delivery if pressures remained severe range; otherwise plan for delivery 37 weeks. BPP 8/8 with oligo resolved. I discussed with patient my recommendation for staying for obs and 24 hr urine collection. She declines to stay and reports she will check BP q2 at home. Return warnings given. Recommend betamethasone for steroid admin. Recommend increase labetalol to 200 bid. All questions answered. To report for second injection NST and turn in 24 hr urine tom.   Natalie Wu 12/16/2021 3:34 PM

## 2021-12-16 NOTE — Progress Notes (Signed)
Pt presents to L/D triage from office for elevated blood pressures. Pt reports no baby related concerns- no bleeding or LOF. Pt states she feels occasional "braxton-hicks" contractions- no current pain.  +2 reflexes, no clonus. Pt reports no headache, blurry vision, atypical edema, or urinary concerns.  Monitors applied and assessing. Initial FHT 165.  BP cycling q70min.

## 2021-12-16 NOTE — Discharge Instructions (Signed)
Return to L&D 12/17/21 at 3 pm for NST, steroid shot and 24 hour

## 2021-12-16 NOTE — OB Triage Note (Signed)
LABOR & DELIVERY OB TRIAGE NOTE  SUBJECTIVE  HPI Natalie Wu is a 31 y.o. G1P0 at [redacted]w[redacted]d who presents to Labor & Delivery for evaluation of elevated blood pressures. She had two severe range pressures and otherwise was normotensive or mild range. Her labs were WNL. Dr. Okey Dupre recommends staying for 24 hours of observation and collecting a 24-hour urine. Natalie Wu prefers to discharge home and check her BPs at home, collect a 24-hour urine, and return tomorrow for NST.  OB History     Gravida  1   Para      Term      Preterm      AB      Living         SAB      IAB      Ectopic      Multiple      Live Births              Scheduled Meds:  betamethasone acetate-betamethasone sodium phosphate       betamethasone acetate-betamethasone sodium phosphate  12 mg Intramuscular Q24 Hr x 2   Continuous Infusions: PRN Meds:.acetaminophen, betamethasone acetate-betamethasone sodium phosphate, hydrOXYzine  OBJECTIVE  BP 140/80   Temp 99.1 F (37.3 C) (Oral)   Resp 18   Ht 5\' 3"  (1.6 m)   Wt 105.7 kg   LMP 03/26/2021   BMI 41.27 kg/m   NST I reviewed the NST and it was reactive.  Baseline: 150 Variability: moderate Accelerations: present Decelerations:none Toco: irritability Category 1  ASSESSMENT Impression  1) Pregnancy at G1P0, [redacted]w[redacted]d, Estimated Date of Delivery: 01/10/22 2) Reassuring maternal/fetal status  PLAN  1) Discharge home with strict return/labor precautions 2) Collect 24-hour urine 3) Monitor BPs at home 4) Increase labetalol to 200 mg BID 5) NST tomorrow.  03/12/22, CNM

## 2021-12-16 NOTE — Progress Notes (Unsigned)
Pt had a MFM appt today.  Orders per Dr. Judeth Cornfield to transport [facility] to [facility] via [facility] for Elevated BP's.  Report given to Emeterio Reeve RN via telephone and at the nurse desk in [place].  Guadlupe Spanish, CNM and Dr. Judeth Cornfield also spoke on the phone about pt Plan of Care prior to pt transport.

## 2021-12-17 ENCOUNTER — Other Ambulatory Visit: Payer: Self-pay

## 2021-12-17 ENCOUNTER — Encounter: Payer: Self-pay | Admitting: Obstetrics and Gynecology

## 2021-12-17 ENCOUNTER — Observation Stay
Admission: RE | Admit: 2021-12-17 | Discharge: 2021-12-17 | Disposition: A | Discharge status: Home / Self Care | Payer: 59 | Source: Ambulatory Visit | Attending: Obstetrics and Gynecology | Admitting: Obstetrics and Gynecology

## 2021-12-17 DIAGNOSIS — Z3A36 36 weeks gestation of pregnancy: Secondary | ICD-10-CM | POA: Insufficient documentation

## 2021-12-17 DIAGNOSIS — O133 Gestational [pregnancy-induced] hypertension without significant proteinuria, third trimester: Principal | ICD-10-CM | POA: Insufficient documentation

## 2021-12-17 DIAGNOSIS — Z3480 Encounter for supervision of other normal pregnancy, unspecified trimester: Secondary | ICD-10-CM

## 2021-12-17 DIAGNOSIS — Z3490 Encounter for supervision of normal pregnancy, unspecified, unspecified trimester: Secondary | ICD-10-CM

## 2021-12-17 LAB — CREATININE, URINE, 24 HOUR
Collection Interval-UCRE24: 24 hours
Creatinine, 24H Ur: 932 mg/d (ref 600–1800)
Creatinine, Urine: 81 mg/dL
Urine Total Volume-UCRE24: 1150 mL

## 2021-12-17 NOTE — OB Triage Note (Signed)
Patient is a G1P0 at [redacted]w[redacted]d for scheduled NST. Patient reports +FM, denies ctx, vaginal bleeding and LOF. External monitors applied and assessing.

## 2021-12-17 NOTE — OB Triage Note (Signed)
Discharge instructions, labor precautions, and follow-up care reviewed with patient and family member. All questions answered. Patient verbalized understanding. Discharged ambulatory off unit.

## 2021-12-18 ENCOUNTER — Ambulatory Visit (INDEPENDENT_AMBULATORY_CARE_PROVIDER_SITE_OTHER): Payer: 59 | Admitting: Licensed Practical Nurse

## 2021-12-18 ENCOUNTER — Other Ambulatory Visit: Payer: Self-pay | Admitting: Obstetrics and Gynecology

## 2021-12-18 VITALS — BP 126/84 | Wt 233.0 lb

## 2021-12-18 DIAGNOSIS — Z3A36 36 weeks gestation of pregnancy: Secondary | ICD-10-CM

## 2021-12-18 DIAGNOSIS — Z3689 Encounter for other specified antenatal screening: Secondary | ICD-10-CM

## 2021-12-18 DIAGNOSIS — O133 Gestational [pregnancy-induced] hypertension without significant proteinuria, third trimester: Secondary | ICD-10-CM

## 2021-12-18 DIAGNOSIS — O0990 Supervision of high risk pregnancy, unspecified, unspecified trimester: Secondary | ICD-10-CM

## 2021-12-18 NOTE — Progress Notes (Signed)
Routine Prenatal Care Visit  Subjective  Natalie Wu is a 31 y.o. G1P0 at [redacted]w[redacted]d being seen today for ongoing prenatal care.  She is currently monitored for the following issues for this high-risk pregnancy and has Iron deficiency anemia; Situational anxiety; Weight loss counseling, encounter for; Chronic headaches; B12 deficiency; Vitamin D insufficiency; Obesity (BMI 30-39.9); Acute non-recurrent frontal sinusitis; Supervision of other normal pregnancy, antepartum; Labor and delivery, indication for care; and Pregnancy on their problem list.  ----------------------------------------------------------------------------------- Patient reports no complaints.  Here with mother.  Denies any HA, visual disturbances or RUQ pain. Feels ready for induction.   Contractions: Not present. Vag. Bleeding: None.  Movement: Present. Leaking Fluid denies.  ----------------------------------------------------------------------------------- The following portions of the patient's history were reviewed and updated as appropriate: allergies, current medications, past family history, past medical history, past social history, past surgical history and problem list. Problem list updated.  Objective  Blood pressure 126/84, weight 233 lb (105.7 kg), last menstrual period 03/26/2021. Pregravid weight 206 lb (93.4 kg) Total Weight Gain 27 lb (12.2 kg) Urinalysis: Urine Protein    Urine Glucose    Fetal Status:     Movement: Present    Baseline 155, moderate variability, pos accel, neg decel  General:  Alert, oriented and cooperative. Patient is in no acute distress.  Skin: Skin is warm and dry. No rash noted.   Cardiovascular: Normal heart rate noted  Respiratory: Normal respiratory effort, no problems with respiration noted  Abdomen: Soft, gravid, appropriate for gestational age. Pain/Pressure: Absent     Pelvic:  Cervical exam deferred        Extremities: Normal range of motion.     Mental Status: Normal mood and  affect. Normal behavior. Normal judgment and thought content.   Assessment   31 y.o. G1P0 at [redacted]w[redacted]d by  01/10/2022, by Ultrasound presenting for routine prenatal visit  Plan   pregnancy1 Problems (from 05/08/21 to present)     Problem Noted Resolved   Supervision of other normal pregnancy, antepartum 05/08/2021 by Ellwood Sayers, CNM No   Overview Addendum 11/25/2021  8:29 AM by Ellwood Sayers, CNM     Nursing Staff Provider  Office Location  [facility] Dating  EDD by 6w u/s  Language  English  Anatomy US  Norm, insuff views, norm fu  Flu Vaccine  Oct 2022 Genetic Screen  NIPS: negative- xx  TDaP vaccine   11/03/21 Hgb A1C or  GTT Early :5.3 Third trimester : 139  Covid Vaccinated    LAB RESULTS   Rhogam  NA Blood Type O/Positive/-- (01/17 1033)   Feeding Plan Breast Antibody Negative (01/17 1033)  Contraception POP Rubella 2.66 (01/17 1033)  Circumcision NA RPR Non Reactive (01/17 1033)   Pediatrician   Peds HBsAg Negative (01/17 1033)   Support Person Ronaldo Miyamoto HIV Non Reactive (01/17 1033)  Prenatal Classes Reading  Varicella Immune     GBS  (For PCN allergy, check sensitivities)   BTL Consent     VBAC Consent  Pap ASC-US HPV neg 2020     Hgb Electro    Pelvis Tested  CF      SMA                    Term labor symptoms and general obstetric precautions including but not limited to vaginal bleeding, contractions, leaking of fluid and fetal movement were reviewed in detail with the patient. Please refer to After Visit Summary for other counseling recommendations.   IOL  7/22  Carie Caddy, CNM  Domingo Pulse, [facility]  12/21/21  9:39 AM

## 2021-12-21 ENCOUNTER — Inpatient Hospital Stay
Admission: EM | Admit: 2021-12-21 | Discharge: 2021-12-24 | DRG: 806 | Disposition: A | Discharge status: Home / Self Care | Payer: 59 | Attending: Certified Nurse Midwife | Admitting: Certified Nurse Midwife

## 2021-12-21 ENCOUNTER — Encounter: Payer: Self-pay | Admitting: Obstetrics and Gynecology

## 2021-12-21 ENCOUNTER — Other Ambulatory Visit: Payer: Self-pay

## 2021-12-21 DIAGNOSIS — D62 Acute posthemorrhagic anemia: Secondary | ICD-10-CM | POA: Diagnosis not present

## 2021-12-21 DIAGNOSIS — Z3A37 37 weeks gestation of pregnancy: Secondary | ICD-10-CM

## 2021-12-21 DIAGNOSIS — O9081 Anemia of the puerperium: Secondary | ICD-10-CM | POA: Diagnosis not present

## 2021-12-21 DIAGNOSIS — O99214 Obesity complicating childbirth: Secondary | ICD-10-CM | POA: Diagnosis not present

## 2021-12-21 DIAGNOSIS — O134 Gestational [pregnancy-induced] hypertension without significant proteinuria, complicating childbirth: Secondary | ICD-10-CM | POA: Diagnosis not present

## 2021-12-21 DIAGNOSIS — O133 Gestational [pregnancy-induced] hypertension without significant proteinuria, third trimester: Secondary | ICD-10-CM | POA: Diagnosis not present

## 2021-12-21 DIAGNOSIS — Z7982 Long term (current) use of aspirin: Secondary | ICD-10-CM

## 2021-12-21 DIAGNOSIS — Z3480 Encounter for supervision of other normal pregnancy, unspecified trimester: Secondary | ICD-10-CM

## 2021-12-21 LAB — COMPREHENSIVE METABOLIC PANEL
ALT: 15 U/L (ref 0–44)
AST: 21 U/L (ref 15–41)
Albumin: 2.5 g/dL — ABNORMAL LOW (ref 3.5–5.0)
Alkaline Phosphatase: 158 U/L — ABNORMAL HIGH (ref 38–126)
Anion gap: 11 (ref 5–15)
BUN: 6 mg/dL (ref 6–20)
CO2: 20 mmol/L — ABNORMAL LOW (ref 22–32)
Calcium: 9.4 mg/dL (ref 8.9–10.3)
Chloride: 107 mmol/L (ref 98–111)
Creatinine, Ser: 0.63 mg/dL (ref 0.44–1.00)
GFR, Estimated: >60 mL/min (ref >60)
Glucose, Bld: 135 mg/dL — ABNORMAL HIGH (ref 70–99)
Potassium: 3.4 mmol/L — ABNORMAL LOW (ref 3.5–5.1)
Sodium: 138 mmol/L (ref 135–145)
Total Bilirubin: 0.4 mg/dL (ref 0.3–1.2)
Total Protein: 5.8 g/dL — ABNORMAL LOW (ref 6.5–8.1)

## 2021-12-21 LAB — CBC
HCT: 29.4 % — ABNORMAL LOW (ref 36.0–46.0)
Hemoglobin: 9.2 g/dL — ABNORMAL LOW (ref 12.0–15.0)
MCH: 23.5 pg — ABNORMAL LOW (ref 26.0–34.0)
MCHC: 31.3 g/dL (ref 30.0–36.0)
MCV: 75.2 fL — ABNORMAL LOW (ref 80.0–100.0)
Platelets: 282 10*3/uL (ref 150–400)
RBC: 3.91 MIL/uL (ref 3.87–5.11)
RDW: 16.3 % — ABNORMAL HIGH (ref 11.5–15.5)
WBC: 11.2 10*3/uL — ABNORMAL HIGH (ref 4.0–10.5)
nRBC: 0 % (ref 0.0–0.2)

## 2021-12-21 LAB — PROTEIN / CREATININE RATIO, URINE
Creatinine, Urine: 244 mg/dL
Protein Creatinine Ratio: 0.09 mg/mg{Cre} (ref 0.00–0.15)
Total Protein, Urine: 23 mg/dL

## 2021-12-21 LAB — TYPE AND SCREEN
ABO/RH(D): O POS
Antibody Screen: NEGATIVE

## 2021-12-21 LAB — ABO/RH: ABO/RH(D): O POS

## 2021-12-21 MED ORDER — HYDRALAZINE HCL 20 MG/ML IJ SOLN: 10.0000 mg | INTRAMUSCULAR | Status: DC | PRN

## 2021-12-21 MED ORDER — OXYTOCIN-SODIUM CHLORIDE 30-0.9 UT/500ML-% IV SOLN
2.5000 [IU]/h | INTRAVENOUS | Status: DC
  Filled 2021-12-21: qty 500

## 2021-12-21 MED ORDER — LIDOCAINE HCL (PF) 1 % IJ SOLN
30.0000 mL | INTRAMUSCULAR | Status: AC | PRN
  Administered 2021-12-22: 30 mL via SUBCUTANEOUS

## 2021-12-21 MED ORDER — HYDROXYZINE HCL 25 MG PO TABS: 50.0000 mg | ORAL_TABLET | Freq: Four times a day (QID) | ORAL | Status: DC | PRN

## 2021-12-21 MED ORDER — LABETALOL HCL 200 MG PO TABS
200.0000 mg | ORAL_TABLET | Freq: Two times a day (BID) | ORAL | Status: DC
  Administered 2021-12-21 – 2021-12-24 (×6): 200 mg via ORAL
  Filled 2021-12-21: qty 1
  Filled 2021-12-21 (×2): qty 2
  Filled 2021-12-21 (×3): qty 1

## 2021-12-21 MED ORDER — LABETALOL HCL 5 MG/ML IV SOLN
INTRAVENOUS | Status: AC
  Filled 2021-12-21: qty 4

## 2021-12-21 MED ORDER — LABETALOL HCL 5 MG/ML IV SOLN: 80.0000 mg | INTRAVENOUS | Status: DC | PRN

## 2021-12-21 MED ORDER — ONDANSETRON HCL 4 MG/2ML IJ SOLN
4.0000 mg | Freq: Four times a day (QID) | INTRAMUSCULAR | Status: DC | PRN
  Administered 2021-12-21 – 2021-12-22 (×2): 4 mg via INTRAVENOUS
  Filled 2021-12-21 (×2): qty 2

## 2021-12-21 MED ORDER — OXYTOCIN-SODIUM CHLORIDE 30-0.9 UT/500ML-% IV SOLN
1.0000 m[IU]/min | INTRAVENOUS | Status: DC
  Administered 2021-12-21: 2 m[IU]/min via INTRAVENOUS
  Filled 2021-12-21: qty 500

## 2021-12-21 MED ORDER — SOD CITRATE-CITRIC ACID 500-334 MG/5ML PO SOLN: 30.0000 mL | ORAL | Status: DC | PRN

## 2021-12-21 MED ORDER — ACETAMINOPHEN 325 MG PO TABS
650.0000 mg | ORAL_TABLET | ORAL | Status: DC | PRN
  Filled 2021-12-21: qty 2

## 2021-12-21 MED ORDER — LABETALOL HCL 5 MG/ML IV SOLN: 40.0000 mg | INTRAVENOUS | Status: DC | PRN

## 2021-12-21 MED ORDER — FENTANYL CITRATE (PF) 100 MCG/2ML IJ SOLN
50.0000 ug | INTRAMUSCULAR | Status: DC | PRN
  Administered 2021-12-21: 100 ug via INTRAVENOUS
  Administered 2021-12-21: 50 ug via INTRAVENOUS
  Administered 2021-12-21 – 2021-12-22 (×4): 100 ug via INTRAVENOUS
  Filled 2021-12-21 (×6): qty 2

## 2021-12-21 MED ORDER — LORAZEPAM 2 MG/ML IJ SOLN
1.0000 mg | Freq: Once | INTRAMUSCULAR | Status: AC
Start: 2021-12-21 — End: 2021-12-21
  Administered 2021-12-21: 1 mg via INTRAVENOUS
  Filled 2021-12-21: qty 1

## 2021-12-21 MED ORDER — LABETALOL HCL 5 MG/ML IV SOLN
20.0000 mg | INTRAVENOUS | Status: DC | PRN
  Administered 2021-12-21: 20 mg via INTRAVENOUS

## 2021-12-21 MED ORDER — MISOPROSTOL 25 MCG QUARTER TABLET
50.0000 ug | ORAL_TABLET | ORAL | Status: DC | PRN
  Administered 2021-12-21: 50 ug via VAGINAL
  Filled 2021-12-21: qty 1

## 2021-12-21 MED ORDER — LACTATED RINGERS IV SOLN: INTRAVENOUS | Status: DC

## 2021-12-21 MED ORDER — OXYTOCIN BOLUS FROM INFUSION: 333.0000 mL | Freq: Once | INTRAVENOUS | Status: DC

## 2021-12-21 MED ORDER — LACTATED RINGERS IV SOLN: 500.0000 mL | INTRAVENOUS | Status: DC | PRN

## 2021-12-21 NOTE — Progress Notes (Addendum)
Pt. Sitting on edge of bed eating meal. RN attempt to locate FHT, will reassess with repositioning.

## 2021-12-21 NOTE — Progress Notes (Signed)
LABOR NOTE   SUBJECTIVE:   Natalie Wu is a 30 y.o.GP@ at [redacted]w[redacted]d who is having her labor induced for GHTN. Her Cook catheter has been expelled, and she is receiving a low dose of Pitocin.  Analgesia:  Plans epidural  OBJECTIVE:  BP (!) 145/87   Pulse 89   Temp 98.7 F (37.1 C) (Axillary)   Resp 14   Ht 5\' 3"  (1.6 m)   Wt 105.7 kg   LMP 03/26/2021   BMI 41.27 kg/m  No intake/output data recorded.  SVE:   Dilation: 2 Effacement (%): 60 Station: -3 Exam by:: Milanie Rosenfield CNM CONTRACTIONS: regular, every 1-2 minutes FHR: Fetal heart tracing reviewed. Baseline: 140 Variability: moderate Accelerations: present Decelerations:none Category 1  Labs: Lab Results  Component Value Date   WBC 11.2 (H) 12/21/2021   HGB 9.2 (L) 12/21/2021   HCT 29.4 (L) 12/21/2021   MCV 75.2 (L) 12/21/2021   PLT 282 12/21/2021    ASSESSMENT: 1) Induction of labor for GHTN,  cervical ripening, making cervical change     Coping: well     Membranes: intact  Principal Problem:   Labor and delivery, indication for care   PLAN: Continue present management Titrate Pitocin  Consider AROM when appropriate Anticipate NSVD Dr. 12/23/2021 updated  Feliberto Gottron, CNM 12/21/2021 8:04 PM

## 2021-12-21 NOTE — Progress Notes (Signed)
Patient up to bathroom, tracing maternal, RN adjusting [place] continuously for FHT.

## 2021-12-21 NOTE — Progress Notes (Signed)
LABOR NOTE   SUBJECTIVE:   Natalie Wu is a 30 y.o.GP@ at [redacted]w[redacted]d who is being induced for [facility]. Preeclampsia labs are all WNL. She has had one severe range BP but otherwise they have been 140s-150s/90s-100s. She has received one dose of misoprostol and is contracting too frequently to have another dose at this time. She was open to placement of a Cook catheter but was unable to tolerate the procedure and requested that it be removed before inflation was complete.  Analgesia:  Plans epidural  OBJECTIVE:  BP (!) 152/99   Pulse 95   Temp 98.2 F (36.8 C) (Oral)   Resp 14   Ht 5\' 3"  (1.6 m)   Wt 105.7 kg   LMP 03/26/2021   BMI 41.27 kg/m  No intake/output data recorded.  SVE:   Dilation: Fingertip Effacement (%): Thick Station: -3 Exam by:: Kemaria Dedic CNM CONTRACTIONS: regular, every 1-2 minutes FHR: Fetal heart tracing reviewed. Baseline: 150 Variability: moderate Accelerations: present Decelerations:none Category 1  Labs: Lab Results  Component Value Date   WBC 11.2 (H) 12/21/2021   HGB 9.2 (L) 12/21/2021   HCT 29.4 (L) 12/21/2021   MCV 75.2 (L) 12/21/2021   PLT 282 12/21/2021    ASSESSMENT: 1)  Cervical ripening in preparation for induction of labor for GHTN     Coping: well, feeling a little overwhelmed     Membranes: intact  Principal Problem:   Labor and delivery, indication for care   PLAN: Will place another dose of misoprostol if contractions space out Consider AROM/Pitocin when appropriate Anticipate NSVD   12/23/2021, CNM 12/21/2021 1:27 PM

## 2021-12-21 NOTE — Progress Notes (Signed)
LABOR NOTE   SUBJECTIVE:   Natalie Wu is a 30 y.o.GP@ at [redacted]w[redacted]d who is in the cervical ripening phase of her IOL for GHTN. She has had one dose of IV labetalol per protocol for severe range BP. She has been contracting frequently with one dose of misoprostol. We discussed trying the [facility] catheter again with  Ativan beforehand. She is agreement with that plan.   Analgesia:  Plans epidural  OBJECTIVE:  BP 133/72   Pulse 86   Temp 98.7 F (37.1 C) (Axillary)   Resp 14   Ht 5\' 3"  (1.6 m)   Wt 105.7 kg   LMP 03/26/2021   BMI 41.27 kg/m  No intake/output data recorded.  SVE:   Dilation: 1 Effacement (%): 50 Station: -3 Exam by:: Krishay Faro CNM CONTRACTIONS: regular, every 1.5-2 minutes FHR: Fetal heart tracing reviewed. Baseline: 150 Variability: moderate Accelerations: present Decelerations:none Category 1  Labs: Lab Results  Component Value Date   WBC 11.2 (H) 12/21/2021   HGB 9.2 (L) 12/21/2021   HCT 29.4 (L) 12/21/2021   MCV 75.2 (L) 12/21/2021   PLT 282 12/21/2021    ASSESSMENT: 1)  Cervical ripening     Coping: well     Membranes: intact   Principal Problem:   Labor and delivery, indication for care   PLAN: Place Cook catheter after 12/23/2021 is comfortable Consider AROM/Pitocin when appropriate Anticipate NSVD  Tresa Endo, CNM 12/21/2021 5:15 PM

## 2021-12-21 NOTE — H&P (Signed)
History and Physical   HPI  Natalie Wu is a 31 y.o. G1P0 at [redacted]w[redacted]d Estimated Date of Delivery: 01/10/22 who is being admitted for induction of labor for gestational hypertension. She is having some irregular contractions. She denies HA, visual changes, epigastric pain, vagina bleeding, and LOF. She endorses good fetal movement. We have discussed the plan for cervical ripening and induction, and she is in agreement.   OB History  OB History  Gravida Para Term Preterm AB Living  1 0 0 0 0 0  SAB IAB Ectopic Multiple Live Births  0 0 0 0 0    # Outcome Date GA Lbr Len/2nd Weight Sex Delivery Anes PTL Lv  1 Current             PROBLEM LIST  Pregnancy complications or risks: Patient Active Problem List   Diagnosis Date Noted   Pregnancy 12/17/2021   Labor and delivery, indication for care 12/09/2021   Supervision of other normal pregnancy, antepartum 05/08/2021   Acute non-recurrent frontal sinusitis 04/11/2021   Obesity (BMI 30-39.9) 12/27/2020   B12 deficiency 10/08/2020   Vitamin D insufficiency 10/08/2020   Chronic headaches 03/20/2019   Weight loss counseling, encounter for 08/02/2018   Situational anxiety 09/20/2017   Iron deficiency anemia 06/27/2015    Prenatal labs and studies: ABO, Rh: --/--/PENDING (07/23 4742) Antibody: PENDING (07/23 5956) Rubella: 2.66 (01/17 1033) RPR: Non Reactive (05/11 0913)  HBsAg: Negative (01/17 1033)  HIV: Non Reactive (05/11 0913)  LOV:FIEPPIRJ/-- (07/14 1034)   Past Medical History:  Diagnosis Date   Complication of anesthesia    Slow to wake up   GERD (gastroesophageal reflux disease)    Iron deficiency anemia    Migraine      Past Surgical History:  Procedure Laterality Date   CHOLECYSTECTOMY N/A 04/03/2019   Procedure: LAPAROSCOPIC CHOLECYSTECTOMY;  Surgeon: Henrene Dodge, MD;  Location: [facility] ORS;  Service: General;  Laterality: N/A;   FOOT SURGERY     TONSILLECTOMY     WISDOM TOOTH EXTRACTION        Medications    Current Discharge Medication List     CONTINUE these medications which have NOT CHANGED   Details  labetalol (NORMODYNE) 200 MG tablet Take 0.5 tablets (100 mg total) by mouth 2 (two) times daily. Qty: 60 tablet, Refills: 3    Prenatal Vit-Fe Fumarate-FA (MULTIVITAMIN-PRENATAL) 27-0.8 MG TABS tablet Take 1 tablet by mouth daily at 12 noon.    aspirin 81 MG chewable tablet Chew 81 mg by mouth daily.         Allergies  Patient has no known allergies.  Review of Systems  Pertinent items noted in HPI and remainder of comprehensive ROS otherwise negative.  Physical Exam  BP (!) 146/91 (BP Location: Left Arm)   Pulse 99   Temp 98.2 F (36.8 C) (Oral)   Resp 14   Ht 5\' 3"  (1.6 m)   Wt 105.7 kg   LMP 03/26/2021   BMI 41.27 kg/m   Lungs:  CTAB Cardio: RRR without M/R/G Abd: Soft, gravid, NT, normal bowel sounds Presentation: cephalic  DTRs: 1+, no clonus CERVIX: Dilation: Fingertip Effacement (%): Thick Station: -3 Presentation: Vertex Exam by:: [email or web address] CNM  See Prenatal records for more detailed PE.     FHR:  Baseline: 160 Variability: moderate Accelerations: present Decelerations: absent Toco: irregular, every 3-7 minutes Category 1  Test Results  Results for orders placed or performed during the hospital encounter of 12/21/21 (from the past 24  hour(s))  CBC     Status: Abnormal   Collection Time: 12/21/21  8:38 AM  Result Value Ref Range   WBC 11.2 (H) 4.0 - 10.5 K/uL   RBC 3.91 3.87 - 5.11 MIL/uL   Hemoglobin 9.2 (L) 12.0 - 15.0 g/dL   HCT 16.9 (L) 67.8 - 93.8 %   MCV 75.2 (L) 80.0 - 100.0 fL   MCH 23.5 (L) 26.0 - 34.0 pg   MCHC 31.3 30.0 - 36.0 g/dL   RDW 10.1 (H) 75.1 - 02.5 %   Platelets 282 150 - 400 K/uL   nRBC 0.0 0.0 - 0.2 %  Type and screen [facility]     Status: None (Preliminary result)   Collection Time: 12/21/21  8:38 AM  Result Value Ref Range   ABO/RH(D) PENDING    Antibody Screen  PENDING    Sample Expiration      12/24/2021,2359 Performed at [facility], [address] [place], [place] [address]   Comprehensive metabolic panel     Status: Abnormal   Collection Time: 12/21/21  8:38 AM  Result Value Ref Range   Sodium 138 135 - 145 mmol/L   Potassium 3.4 (L) 3.5 - 5.1 mmol/L   Chloride 107 98 - 111 mmol/L   CO2 20 (L) 22 - 32 mmol/L   Glucose, Bld 135 (H) 70 - 99 mg/dL   BUN 6 6 - 20 mg/dL   Creatinine, Ser 8.24 0.44 - 1.00 mg/dL   Calcium 9.4 8.9 - 23.5 mg/dL   Total Protein 5.8 (L) 6.5 - 8.1 g/dL   Albumin 2.5 (L) 3.5 - 5.0 g/dL   AST 21 15 - 41 U/L   ALT 15 0 - 44 U/L   Alkaline Phosphatase 158 (H) 38 - 126 U/L   Total Bilirubin 0.4 0.3 - 1.2 mg/dL   GFR, Estimated >36 >14 mL/min   Anion gap 11 5 - 15  Protein / creatinine ratio, urine     Status: None   Collection Time: 12/21/21  8:38 AM  Result Value Ref Range   Creatinine, Urine 244 mg/dL   Total Protein, Urine 23 mg/dL   Protein Creatinine Ratio 0.09 0.00 - 0.15 mg/mg[Cre]   Group B Strep negative  Assessment   G1P0 at [redacted]w[redacted]d Estimated Date of Delivery: 01/10/22  Reassuring maternal/fetal status. IOL for [facility]  Patient Active Problem List   Diagnosis Date Noted   Pregnancy 12/17/2021   Labor and delivery, indication for care 12/09/2021   Supervision of other normal pregnancy, antepartum 05/08/2021   Acute non-recurrent frontal sinusitis 04/11/2021   Obesity (BMI 30-39.9) 12/27/2020   B12 deficiency 10/08/2020   Vitamin D insufficiency 10/08/2020   Chronic headaches 03/20/2019   Weight loss counseling, encounter for 08/02/2018   Situational anxiety 09/20/2017   Iron deficiency anemia 06/27/2015    Plan  1. Admit to L&D for cervical ripening and IOL 2. EFM: Continuous -- Category 1 3. Pharmacologic pain relief if desired.   4. Admission labs  5. Continue PO labetalol  6. Anticipate NSVD 7. Dr. Feliberto Gottron notified of admission  Guadlupe Spanish, [facility] 12/21/2021 9:20  AM

## 2021-12-21 NOTE — Progress Notes (Signed)
Natalie Wu is a 31 y.o. G1P0 presenting to L&D for scheduled induction. Discussed induction and evaluated her understanding of the process. Natalie Wu verbalized understanding of why she is being induced and agreed to start the process. She reports positive fetal movement and denies vaginal bleeding, leaking of fluid, and regular contractions. We discussed her birth preferences, including desire for an epidural. Provided education on what to expect with misoprostol. Initial vital signs indicate elevated BP, will continue to monitor. Fetal monitors applied and education given. Initial FHT 170. Natalie Wu was oriented to care environment, including call bell and bed control use. Explained the admission packet, including birth certificate worksheet, Natalie Wu Assessment, and feeding log. Her mother and significant other are at bedside for labor support. Natalie Wu is resting comfortably after a well tolerated cervical exam and misoprostol placement by Natalie Wu. Plan to encourage rest, assist with peanut ball position changes to encourage fetal engagement as labor progresses, and reassess cervix in 4 hours.

## 2021-12-22 ENCOUNTER — Inpatient Hospital Stay: Payer: 59 | Admitting: Anesthesiology

## 2021-12-22 ENCOUNTER — Encounter: Payer: Self-pay | Admitting: Oncology

## 2021-12-22 ENCOUNTER — Encounter: Payer: Self-pay | Admitting: Obstetrics and Gynecology

## 2021-12-22 DIAGNOSIS — O134 Gestational [pregnancy-induced] hypertension without significant proteinuria, complicating childbirth: Secondary | ICD-10-CM

## 2021-12-22 DIAGNOSIS — Z3A37 37 weeks gestation of pregnancy: Secondary | ICD-10-CM

## 2021-12-22 LAB — RPR: RPR Ser Ql: NONREACTIVE

## 2021-12-22 MED ORDER — LACTATED RINGERS IV SOLN: 500.0000 mL | Freq: Once | INTRAVENOUS | Status: DC

## 2021-12-22 MED ORDER — PRENATAL MULTIVITAMIN CH
1.0000 | ORAL_TABLET | Freq: Every day | ORAL | Status: DC
Start: 2021-12-23 — End: 2021-12-24
  Administered 2021-12-23 – 2021-12-24 (×2): 1 via ORAL
  Filled 2021-12-22 (×2): qty 1

## 2021-12-22 MED ORDER — EPHEDRINE 5 MG/ML INJ: 10.0000 mg | INTRAVENOUS | Status: DC | PRN

## 2021-12-22 MED ORDER — FENTANYL-BUPIVACAINE-NACL 0.5-0.125-0.9 MG/250ML-% EP SOLN
12.0000 mL/h | EPIDURAL | Status: DC | PRN
  Filled 2021-12-22: qty 250

## 2021-12-22 MED ORDER — FENTANYL-BUPIVACAINE-NACL 0.5-0.125-0.9 MG/250ML-% EP SOLN: 12.0000 mL/h | EPIDURAL | Status: DC | PRN

## 2021-12-22 MED ORDER — PHENYLEPHRINE 80 MCG/ML (10ML) SYRINGE FOR IV PUSH (FOR BLOOD PRESSURE SUPPORT): 80.0000 ug | PREFILLED_SYRINGE | INTRAVENOUS | Status: DC | PRN

## 2021-12-22 MED ORDER — ONDANSETRON HCL 4 MG PO TABS: 4.0000 mg | ORAL_TABLET | ORAL | Status: DC | PRN

## 2021-12-22 MED ORDER — IBUPROFEN 600 MG PO TABS
600.0000 mg | ORAL_TABLET | Freq: Four times a day (QID) | ORAL | Status: DC
  Administered 2021-12-22: 600 mg via ORAL
  Filled 2021-12-22 (×2): qty 1

## 2021-12-22 MED ORDER — FENTANYL-BUPIVACAINE-NACL 0.5-0.125-0.9 MG/250ML-% EP SOLN
EPIDURAL | Status: DC | PRN
  Administered 2021-12-22: 12 mL/h via EPIDURAL

## 2021-12-22 MED ORDER — DOCUSATE SODIUM 100 MG PO CAPS
100.0000 mg | ORAL_CAPSULE | Freq: Two times a day (BID) | ORAL | Status: DC
Start: 2021-12-23 — End: 2021-12-24
  Filled 2021-12-22: qty 1

## 2021-12-22 MED ORDER — BENZOCAINE-MENTHOL 20-0.5 % EX AERO
1.0000 | INHALATION_SPRAY | CUTANEOUS | Status: DC | PRN
  Administered 2021-12-23: 1 via TOPICAL
  Filled 2021-12-22: qty 56

## 2021-12-22 MED ORDER — ONDANSETRON HCL 4 MG/2ML IJ SOLN: 4.0000 mg | INTRAMUSCULAR | Status: DC | PRN

## 2021-12-22 MED ORDER — DIBUCAINE (PERIANAL) 1 % EX OINT: 1.0000 | TOPICAL_OINTMENT | CUTANEOUS | Status: DC | PRN

## 2021-12-22 MED ORDER — SENNOSIDES-DOCUSATE SODIUM 8.6-50 MG PO TABS
2.0000 | ORAL_TABLET | ORAL | Status: DC
Start: 2021-12-22 — End: 2021-12-24
  Filled 2021-12-22 (×2): qty 2

## 2021-12-22 MED ORDER — DIPHENHYDRAMINE HCL 50 MG/ML IJ SOLN: 12.5000 mg | INTRAMUSCULAR | Status: DC | PRN

## 2021-12-22 MED ORDER — COCONUT OIL OIL: 1.0000 | TOPICAL_OIL | Status: DC | PRN

## 2021-12-22 MED ORDER — LIDOCAINE HCL (PF) 1 % IJ SOLN
INTRAMUSCULAR | Status: DC | PRN
  Administered 2021-12-22: 3 mL

## 2021-12-22 MED ORDER — LIDOCAINE-EPINEPHRINE (PF) 1.5 %-1:200000 IJ SOLN
INTRAMUSCULAR | Status: DC | PRN
  Administered 2021-12-22: 3 mL via PERINEURAL

## 2021-12-22 MED ORDER — OXYCODONE-ACETAMINOPHEN 5-325 MG PO TABS: 2.0000 | ORAL_TABLET | ORAL | Status: DC | PRN

## 2021-12-22 MED ORDER — OXYCODONE-ACETAMINOPHEN 5-325 MG PO TABS: 1.0000 | ORAL_TABLET | ORAL | Status: DC | PRN

## 2021-12-22 MED ORDER — FERROUS SULFATE 325 (65 FE) MG PO TABS
325.0000 mg | ORAL_TABLET | Freq: Every day | ORAL | Status: DC
  Administered 2021-12-23: 325 mg via ORAL
  Filled 2021-12-22: qty 1

## 2021-12-22 MED ORDER — BUPIVACAINE HCL (PF) 0.25 % IJ SOLN
INTRAMUSCULAR | Status: DC | PRN
  Administered 2021-12-22: 4 mL via EPIDURAL

## 2021-12-22 MED ORDER — WITCH HAZEL-GLYCERIN EX PADS
1.0000 | MEDICATED_PAD | CUTANEOUS | Status: DC | PRN
  Administered 2021-12-23: 1 via TOPICAL
  Filled 2021-12-22 (×2): qty 100

## 2021-12-22 MED ORDER — SIMETHICONE 80 MG PO CHEW: 80.0000 mg | CHEWABLE_TABLET | ORAL | Status: DC | PRN

## 2021-12-22 NOTE — Progress Notes (Signed)
LABOR NOTE   SUBJECTIVE:   Natalie Wu is a 30 y.o.GP@ at [redacted]w[redacted]d who is being induced for [facility]. She is currently receiving 14 mU/min of Pitocin. She is feeling some of the contractions. Frequent position changes to facilitate fetal descent. Cervix not rechecked at this time. BPs have remained in mild range.  Analgesia: IV pain meds and plans epidural  OBJECTIVE:  BP 134/86   Pulse 85   Temp 98.3 F (36.8 C) (Oral)   Resp 14   Ht 5\' 3"  (1.6 m)   Wt 105.7 kg   LMP 03/26/2021   BMI 41.27 kg/m  No intake/output data recorded.  SVE:   Dilation: 2 Effacement (%): 60 Station: -3 Exam by:: Aaleigha Bozza CNM CONTRACTIONS: regular, every 1-3 minutes FHR: Fetal heart tracing reviewed. Baseline: 145 Variability: moderate Accelerations: present Decelerations:none Category 1  Labs: Lab Results  Component Value Date   WBC 11.2 (H) 12/21/2021   HGB 9.2 (L) 12/21/2021   HCT 29.4 (L) 12/21/2021   MCV 75.2 (L) 12/21/2021   PLT 282 12/21/2021    ASSESSMENT: 1)  Cervical ripening , not in labor     Coping: well     Membranes: intact   Principal Problem:   Labor and delivery, indication for care   PLAN: Continue titration of Pitocin Re-evaluate in 1-2 hours Anticipate NSVD   12/23/2021, CNM 12/22/2021 12:07 AM

## 2021-12-22 NOTE — Progress Notes (Signed)
LABOR NOTE   Natalie Wu 30 y.o.GP@ at [redacted]w[redacted]d  SUBJECTIVE:  Pt has been pushing for approximately 2 hrs, she is having significant discomfort in her back and shoulders from muscle cramp.. Analgesia: Epidural  OBJECTIVE:  BP (!) 141/105   Pulse 90   Temp 98.1 F (36.7 C) (Oral)   Resp 18   Ht 5\' 3"  (1.6 m)   Wt 105.7 kg   LMP 03/26/2021   SpO2 97%   BMI 41.27 kg/m  Total I/O In: -  Out: 40 [Urine:40]  She has shown cervical change. CERVIX: 10:  100%:    +2-3 :    SVE:   Dilation: 10 Effacement (%): 100 Station: 0 Exam by:: Polos RN CONTRACTIONS: regular, every 2-3 minutes FHR: Fetal heart tracing reviewed. Baseline: 145 bpm, Variability: Good {> 6 bpm), Accelerations: Reactive, and Decelerations: Variable: moderate Category II    Labs: Lab Results  Component Value Date   WBC 11.2 (H) 12/21/2021   HGB 9.2 (L) 12/21/2021   HCT 29.4 (L) 12/21/2021   MCV 75.2 (L) 12/21/2021   PLT 282 12/21/2021    ASSESSMENT: 1) Labor curve reviewed.       Progress: Active phase labor.     Membranes: ruptured           Principal Problem:   Labor and delivery, indication for care   PLAN: Pt pushing ineffective due to back and shoulder pain. She is rolling around in discomfort. Pt states that she can not push any longer and is requesting assistance with the vacuum. Discussed possible risks and benefits with the patient and her partner . The verbalizes understanding and give consent to vacuum assistance. Dr. 12/23/2021 attending notified and in agreement to plan.    Natalie Wu, CNM  12/22/2021 4:38 PM

## 2021-12-22 NOTE — Progress Notes (Signed)
SVD baby girl skin to skin with mother 

## 2021-12-22 NOTE — Progress Notes (Signed)
LABOR NOTE   Natalie Wu 30 y.o.GP@ at [redacted]w[redacted]d  SUBJECTIVE:  Sleeping after epidural placement Analgesia: Epidural  OBJECTIVE:  BP (!) 144/94   Pulse 86   Temp 98.4 F (36.9 C) (Oral)   Resp 18   Ht 5\' 3"  (1.6 m)   Wt 105.7 kg   LMP 03/26/2021   SpO2 91%   BMI 41.27 kg/m  No intake/output data recorded.   CERVIX:per RN exam after foley placement SVE:   Dilation: 3.5 Effacement (%): 80 Station: -2 Exam by:: Polos RN CONTRACTIONS: regular, every 2 minutes FHR: Fetal heart tracing reviewed. Baseline: 140 bpm, Variability: Good {> 6 bpm), Accelerations: Non-reactive but appropriate for gestational age, and Decelerations: Absent Category I    Labs: Lab Results  Component Value Date   WBC 11.2 (H) 12/21/2021   HGB 9.2 (L) 12/21/2021   HCT 29.4 (L) 12/21/2021   MCV 75.2 (L) 12/21/2021   PLT 282 12/21/2021    ASSESSMENT: 1) Labor curve reviewed.       Progress: Early latent labor.     Membranes: ruptured           Principal Problem:   Labor and delivery, indication for care   PLAN: continue present management  12/23/2021, CNM  12/22/2021 8:54 AM

## 2021-12-22 NOTE — Anesthesia Procedure Notes (Signed)
Epidural Patient location during procedure: OB Start time: 12/22/2021 7:42 AM End time: 12/22/2021 7:57 AM  Staffing Anesthesiologist: Foye Deer, MD Resident/CRNA: Ginger Carne, CRNA Performed: resident/CRNA   Preanesthetic Checklist Completed: patient identified, IV checked, site marked, risks and benefits discussed, surgical consent, monitors and equipment checked, pre-op evaluation and timeout performed  Epidural Patient position: sitting Prep: ChloraPrep Patient monitoring: heart rate, continuous pulse ox and blood pressure Approach: midline Location: L3-L4 Injection technique: LOR saline  Needle:  Needle type: Tuohy  Needle gauge: 17 G Needle length: 9 cm and 9 Needle insertion depth: 8 cm Catheter type: closed end flexible Catheter size: 19 Gauge Catheter at skin depth: 12 cm Test dose: negative and 1.5% lidocaine with Epi 1:200 K  Assessment Sensory level: T10 Events: blood not aspirated, injection not painful, no injection resistance, no paresthesia and negative IV test  Additional Notes 1 attempt Pt. Evaluated and documentation done after procedure finished. Patient identified. Risks/Benefits/Options discussed with patient including but not limited to bleeding, infection, nerve damage, paralysis, failed block, incomplete pain control, headache, blood pressure changes, nausea, vomiting, reactions to medication both or allergic, itching and postpartum back pain. Confirmed with bedside nurse the patient's most recent platelet count. Confirmed with patient that they are not currently taking any anticoagulation, have any bleeding history or any family history of bleeding disorders. Patient expressed understanding and wished to proceed. All questions were answered. Sterile technique was used throughout the entire procedure. Please see nursing notes for vital signs. Test dose was given through epidural catheter and negative prior to continuing to dose  epidural or start infusion. Warning signs of high block given to the patient including shortness of breath, tingling/numbness in hands, complete motor block, or any concerning symptoms with instructions to call for help. Patient was given instructions on fall risk and not to get out of bed. All questions and concerns addressed with instructions to call with any issues or inadequate analgesia.    Patient tolerated the insertion well without immediate complications.Reason for block:procedure for pain

## 2021-12-22 NOTE — Progress Notes (Signed)
First push.  CNM at [facility].  Education given

## 2021-12-22 NOTE — Anesthesia Preprocedure Evaluation (Signed)
Anesthesia Evaluation  Patient identified by MRN, date of birth, ID band Patient awake    Reviewed: Allergy & Precautions, H&P , NPO status , Patient's Chart, lab work & pertinent test results  Airway Mallampati: II  TM Distance: >3 FB     Dental no notable dental hx. (+) Teeth Intact   Pulmonary neg pulmonary ROS,    Pulmonary exam normal        Cardiovascular Normal cardiovascular exam     Neuro/Psych    GI/Hepatic Neg liver ROS, GERD  Controlled,  Endo/Other  negative endocrine ROS  Renal/GU negative Renal ROS     Musculoskeletal   Abdominal   Peds  Hematology  (+) Blood dyscrasia, anemia ,   Anesthesia Other Findings   Reproductive/Obstetrics (+) Pregnancy                             Anesthesia Physical Anesthesia Plan  ASA: 3  Anesthesia Plan: Epidural   Post-op Pain Management:    Induction:   PONV Risk Score and Plan:   Airway Management Planned:   Additional Equipment:   Intra-op Plan:   Post-operative Plan:   Informed Consent: I have reviewed the patients History and Physical, chart, labs and discussed the procedure including the risks, benefits and alternatives for the proposed anesthesia with the patient or authorized representative who has indicated his/her understanding and acceptance.     Dental Advisory Given  Plan Discussed with:   Anesthesia Plan Comments:         Anesthesia Quick Evaluation

## 2021-12-22 NOTE — Progress Notes (Signed)
LABOR NOTE   Natalie Wu 31 y.o.GP@ at [redacted]w[redacted]d  SUBJECTIVE:  Pt has a hot spot on her right side. She is tolerating well. She denies pressure or urge to push.  Analgesia: Epidural  OBJECTIVE:  BP (!) 141/105   Pulse 90   Temp 98.1 F (36.7 C) (Oral)   Resp 18   Ht 5\' 3"  (1.6 m)   Wt 105.7 kg   LMP 03/26/2021   SpO2 97%   BMI 41.27 kg/m  No intake/output data recorded.  She has shown cervical change. CERVIX: 10:  100%:   0:   not felt:    SVE:   Dilation: 10 Effacement (%): 100 Station: 0 Exam by:: Polos RN CONTRACTIONS: regular, every 2 minutes FHR: Fetal heart tracing reviewed. Baseline: 135 bpm, Variability: Good {> 6 bpm), Accelerations: Non-reactive but appropriate for gestational age, and Decelerations: Absent Category I    Labs: Lab Results  Component Value Date   WBC 11.2 (H) 12/21/2021   HGB 9.2 (L) 12/21/2021   HCT 29.4 (L) 12/21/2021   MCV 75.2 (L) 12/21/2021   PLT 282 12/21/2021    ASSESSMENT: 1) Labor curve reviewed.       Progress: Active phase labor.     Membranes: ruptured, clear fluid           2) Principal Problem:   Labor and delivery, indication for care   PLAN: Start pushing   12/23/2021, Doreene Burke  12/22/2021 12:38 PM

## 2021-12-22 NOTE — Progress Notes (Signed)
Pt continues to find it difficult to sustain pushing effort due to being uncomfortable, shoulder pain and being emotionally overwhelmed.  Progress being made with good effort but is not sustained through contraction.  Options of continued pushing or vacuum delivery dicussed.  Pt would like to keep pushing.

## 2021-12-22 NOTE — Progress Notes (Signed)
Resumed pushing

## 2021-12-22 NOTE — Progress Notes (Signed)
LABOR NOTE   SUBJECTIVE:   Natalie Wu is a 30 y.o.GP@ at [redacted]w[redacted]d who is being induced for [facility]. Her Pitocin was discontinued at 0118 due to frequency of contractions. She has not felt painful contractions but is feeling some cramping. We discussed replacing the [facility] catheter, but Garielle states she does not believe she will be able to tolerate it. She did agree to a membrane sweep and continued Pitocin.   Analgesia: IV pain meds and plans epidural  OBJECTIVE:  BP (!) 153/98   Pulse 90   Temp 98.3 F (36.8 C) (Oral)   Resp 14   Ht 5\' 3"  (1.6 m)   Wt 105.7 kg   LMP 03/26/2021   BMI 41.27 kg/m  No intake/output data recorded.  SVE:   Dilation: 2 Effacement (%): 50 Station: -3 Exam by:: Jann Ra CNM CONTRACTIONS: regular, every 2-3 minutes, mild FHR: Fetal heart tracing reviewed. Baseline: 140 Variability: moderate Accelerations: present Decelerations:none Category 1  Labs: Lab Results  Component Value Date   WBC 11.2 (H) 12/21/2021   HGB 9.2 (L) 12/21/2021   HCT 29.4 (L) 12/21/2021   MCV 75.2 (L) 12/21/2021   PLT 282 12/21/2021    ASSESSMENT: 1)  Cervical ripening, prolonged latent phase, not it active labor     Coping: well     Membranes: intact    Principal Problem:   Labor and delivery, indication for care   PLAN: Membrane sweep Restart Pitocin at cervical ripening dose Anticipate NSVD   12/23/2021, CNM 12/22/2021 4:34 AM

## 2021-12-22 NOTE — Progress Notes (Signed)
Pt finding it difficult to push with back pain.  Discussed options of continued pushing or vacuum.  Pt would like to move forward with vacuum delivery.

## 2021-12-22 NOTE — Progress Notes (Signed)
Pt emotionally overwhelmed and uncomfortable.  Taking 10-15 min break and then pushing again.

## 2021-12-22 NOTE — Progress Notes (Signed)
Pt sitting for epidural, will continue to monitor elevated BP, no intervention at this time.

## 2021-12-23 LAB — CBC
HCT: 24.5 % — ABNORMAL LOW (ref 36.0–46.0)
Hemoglobin: 7.6 g/dL — ABNORMAL LOW (ref 12.0–15.0)
MCH: 23.2 pg — ABNORMAL LOW (ref 26.0–34.0)
MCHC: 31 g/dL (ref 30.0–36.0)
MCV: 74.9 fL — ABNORMAL LOW (ref 80.0–100.0)
Platelets: 239 10*3/uL (ref 150–400)
RBC: 3.27 MIL/uL — ABNORMAL LOW (ref 3.87–5.11)
RDW: 16.6 % — ABNORMAL HIGH (ref 11.5–15.5)
WBC: 14 10*3/uL — ABNORMAL HIGH (ref 4.0–10.5)
nRBC: 0 % (ref 0.0–0.2)

## 2021-12-23 MED ORDER — LABETALOL HCL 200 MG PO TABS: 200.0000 mg | ORAL_TABLET | Freq: Two times a day (BID) | ORAL | 0 refills | Status: DC

## 2021-12-23 MED ORDER — IBUPROFEN 600 MG PO TABS: 600.0000 mg | ORAL_TABLET | Freq: Four times a day (QID) | ORAL | 0 refills | Status: DC

## 2021-12-23 NOTE — Discharge Summary (Signed)
   Postpartum Discharge Summary  Date of Service updated 12/23/2021     Patient Name: Natalie Wu DOB: 03/12/1991 MRN: [id number]  Date of admission: 12/21/2021 Delivery date:12/22/2021  Delivering provider: THOMPSON, ANNIE  Date of discharge: 12/23/2021  Admitting diagnosis: Labor and delivery, indication for care [O75.9] Intrauterine pregnancy: [redacted]w[redacted]d     Secondary diagnosis:  Principal Problem:   Labor and delivery, indication for care Active Problems:   Postpartum care following vaginal delivery  Additional problems: gestational hypertension    Discharge diagnosis: Term Pregnancy Delivered                                              Post partum procedures: none Augmentation: AROM and Pitocin Complications: None  Hospital course: Induction of Labor With Vaginal Delivery   30 y.o. yo G1P1001 at [redacted]w[redacted]d was admitted to the hospital 12/21/2021 for induction of labor.  Indication for induction: Gestational hypertension.  Patient had an uncomplicated labor course as follows: Membrane Rupture Time/Date: 5:00 AM ,12/22/2021   Delivery Method:Vaginal, Vacuum (Extractor)  Episiotomy: None  Lacerations:  2nd degree;Perineal;Vaginal  Details of delivery can be found in separate delivery note.  Patient had a routine postpartum course. Patient is discharged home 12/23/21.  Newborn Data: Birth date:12/22/2021  Birth time:3:54 PM  Gender:Female  Living status:Living  Apgars:7 ,9  Weight:2790 g   Magnesium Sulfate received: No BMZ received: No Rhophylac:N/A MMR:No T-DaP:Given prenatally Flu: No Transfusion:No  Physical exam  Vitals:   12/22/21 2101 12/23/21 0012 12/23/21 0401 12/23/21 0753  BP: 121/83 115/82 117/81 111/66  Pulse: 97 81 91 87  Resp: 18 18 18 18  Temp: 99.5 F (37.5 C) 98.4 F (36.9 C) 98.7 F (37.1 C) 98.7 F (37.1 C)  TempSrc: Oral Oral Oral Oral  SpO2: 97% 98% 98% 97%  Weight:      Height:       General: alert, cooperative, and no distress Lochia:  appropriate Uterine Fundus: firm Incision: Healing well with no significant drainage DVT Evaluation: No evidence of DVT seen on physical exam. Negative Homan's sign. Calf/Ankle edema is present Labs: Lab Results  Component Value Date   WBC 14.0 (H) 12/23/2021   HGB 7.6 (L) 12/23/2021   HCT 24.5 (L) 12/23/2021   MCV 74.9 (L) 12/23/2021   PLT 239 12/23/2021      Latest Ref Rng & Units 12/21/2021    8:38 AM  CMP  Glucose 70 - 99 mg/dL 135   BUN 6 - 20 mg/dL 6   Creatinine 0.44 - 1.00 mg/dL 0.63   Sodium 135 - 145 mmol/L 138   Potassium 3.5 - 5.1 mmol/L 3.4   Chloride 98 - 111 mmol/L 107   CO2 22 - 32 mmol/L 20   Calcium 8.9 - 10.3 mg/dL 9.4   Total Protein 6.5 - 8.1 g/dL 5.8   Total Bilirubin 0.3 - 1.2 mg/dL 0.4   Alkaline Phos 38 - 126 U/L 158   AST 15 - 41 U/L 21   ALT 0 - 44 U/L 15    Edinburgh Score:    12/23/2021   12:13 AM  Edinburgh Postnatal Depression Scale Screening Tool  I have been able to laugh and see the funny side of things. 0  I have looked forward with enjoyment to things. 0  I have blamed myself unnecessarily when things went wrong. 0  I   have been anxious or worried for no good reason. 0  I have felt scared or panicky for no good reason. 0  Things have been getting on top of me. 0  I have been so unhappy that I have had difficulty sleeping. 0  I have felt sad or miserable. 0  I have been so unhappy that I have been crying. 0  The thought of harming myself has occurred to me. 0  Edinburgh Postnatal Depression Scale Total 0      After visit meds:  Allergies as of 12/23/2021   No Known Allergies      Medication List     STOP taking these medications    aspirin 81 MG chewable tablet       TAKE these medications    ibuprofen 600 MG tablet Commonly known as: ADVIL Take 1 tablet (600 mg total) by mouth every 6 (six) hours.   labetalol 200 MG tablet Commonly known as: NORMODYNE Take 0.5 tablets (100 mg total) by mouth 2 (two) times  daily. What changed: Another medication with the same name was added. Make sure you understand how and when to take each.   labetalol 200 MG tablet Commonly known as: NORMODYNE Take 1 tablet (200 mg total) by mouth 2 (two) times daily. What changed: You were already taking a medication with the same name, and this prescription was added. Make sure you understand how and when to take each.   multivitamin-prenatal 27-0.8 MG Tabs tablet Take 1 tablet by mouth daily at 12 noon.         Discharge home in stable condition Infant Feeding: Breast Infant Disposition:home with mother Discharge instruction: per After Visit Summary and Postpartum booklet. Activity: Advance as tolerated. Pelvic rest for 6 weeks.  Diet: routine diet Anticipated Birth Control: OCPs Postpartum Appointment:6 weeks Additional Postpartum F/U: BP check 2 weeks Future Appointments: Future Appointments  Date Time Provider [place]  01/06/2022  3:15 PM Philip Aspen, CNM [facility] None  02/04/2022  1:15 PM Philip Aspen, CNM [facility] None   Follow up Visit:  [place] Follow up in 2 week(s).   Why: Please make an appointment  for a 2 week postpartum visit and BP check with any provider. Continue on your BP medication. also make an appointment for a 6 week post delivery appointment to discuss birth control and have a physical. Contact information: [place] [address] [place], CNM  12/23/2021 10:32 AM       12/23/2021 Imagene Riches, CNM

## 2021-12-23 NOTE — Lactation Note (Signed)
This note was copied from a baby's chart. Lactation Consultation Note  Patient Name: Natalie Wu GMWNU'U Date: 12/23/2021   Age:30 hours  Maternal Data   Met with mom in room. Mom is P1, Vacuum assisted vaginal delivery 19hrs ago. My has breastfed 1x, and bottle fed otherwise; no additional breast stimulation/milk removal. Mom desires to  breastfeed. She said she was tired yesterday/last night but feeling better today.  Spoke briefly about feeding plan going forward and plan for today with feeding support. Next feeding due to be at 12:15pm; attempt at the breast with use of supplement + pump set-up and use post all feedings. Mom on board with feeding plan.

## 2021-12-23 NOTE — Lactation Note (Signed)
This note was copied from a baby's chart. Lactation Consultation Note  Patient Name: Natalie Wu SEGBT'D Date: 12/23/2021 Reason for consult: Initial assessment;Primapara;Early term 37-38.6wks Age:30 hours  Maternal Data Has patient been taught Hand Expression?: Yes (per mom) Does the patient have breastfeeding experience prior to this delivery?: No  Mom voices that baby is sleeping well and probably wouldn't wake for feeding. She desires to breastfeed and is open to introduction of pump at bedside, has Spectra at home.  Feeding Mother's Current Feeding Choice: Breast Milk  LATCH Score Baby did not feed during this visit.   Lactation Tools Discussed/Used Tools: Pump Breast pump type: Double-Electric Breast Pump Pump Education: Setup, frequency, and cleaning;Milk Storage Reason for Pumping: additional stimulation (Mom desires to breastfeed but has given bottles 4x since delivery) Pumping frequency: q 3 hours  Instructed to pump every 3 hours, if baby is put to breast, pumping post feedings  Interventions Interventions: Breast feeding basics reviewed;Hand express;Hand pump;Education  Feeding behaviors/patterns, feeding plan for today, normal course of lactation, importance of frequent stimulation and milk removal. Encouraged hand expression pre/post pumping for optimal stimulation and removal.  Feeding plan: -Next feed for baby; work at latching/feeding at the breast -Supplement if needed- possibly needed due to volumes given versus what mom is producing at this time -Use of DEBP post attempts/feeds at the breast.  Discharge Pump: Personal (Spectra)  Consult Status Consult Status: Follow-up    Danford Bad 12/23/2021, 12:41 PM

## 2021-12-23 NOTE — Anesthesia Postprocedure Evaluation (Signed)
Anesthesia Post Note  Patient: Natalie Wu  Procedure(s) Performed: AN AD HOC LABOR EPIDURAL  Patient location during evaluation: Mother Baby Anesthesia Type: Epidural Level of consciousness: awake and alert Pain management: pain level controlled Vital Signs Assessment: post-procedure vital signs reviewed and stable Respiratory status: spontaneous breathing, nonlabored ventilation and respiratory function stable Cardiovascular status: stable Postop Assessment: no headache, no backache and epidural receding Anesthetic complications: no   No notable events documented.   Last Vitals:  Vitals:   12/23/21 0401 12/23/21 0753  BP: 117/81 111/66  Pulse: 91 87  Resp: 18 18  Temp: 37.1 C 37.1 C  SpO2: 98% 97%    Last Pain:  Vitals:   12/23/21 0753  TempSrc: Oral  PainSc:                  Rica Mast

## 2021-12-23 NOTE — Discharge Instructions (Addendum)
Discharge Instructions:   BP Check: Tuesday, August 8th at 3:15pm with Doreene Burke, CNM Follow-up postpartum appointment: Wednesday, September 6th at 1:15pm with Doreene Burke, CNM  If there are any new medications, they have been ordered and will be available for pickup at the listed pharmacy on your way home from the hospital.   Call office if you have any of the following: headache, visual changes, fever >101.0 F, chills, shortness of breath, breast concerns, excessive vaginal bleeding, incision drainage or problems, leg pain or redness, depression or any other concerns. If you have vaginal discharge with an odor, let your doctor know.   It is normal to bleed for up to 6 weeks. You should not soak through more than 1 pad in 1 hour. If you have a blood clot larger than your fist with continued bleeding, call your doctor.   Activity: Do not lift > 10 lbs for 6 weeks (do not lift anything heavier than your baby). No intercourse, tampons, swimming pools, hot tubs, baths (only showers) for 6 weeks.  No driving for 1-2 weeks. Continue prenatal vitamin, especially if breastfeeding. Increase calories and fluids (water) while breastfeeding.   Your milk will come in, in the next couple of days (right now it is colostrum). You may have a slight fever when your milk comes in, but it should go away on its own.  If it does not, and rises above 101 F please call the doctor. You will also feel achy and your breasts will be firm. They will also start to leak. If you are breastfeeding, continue as you have been and you can pump/express milk for comfort.   If you have too much milk, your breasts can become engorged, which could lead to mastitis. This is an infection of the milk ducts. It can be very painful and you will need to notify your doctor to obtain a prescription for antibiotics. You can also treat it with a shower or hot/cold compress.   For concerns about your baby, please call your pediatrician.   For breastfeeding concerns, the lactation consultant can be reached at [phone].   Postpartum blues (feelings of happy one minute and sad another minute) are normal for the first few weeks but if it gets worse let your doctor know.   Congratulations! We enjoyed caring for you and your new bundle of joy!

## 2021-12-24 LAB — CBC WITH DIFFERENTIAL/PLATELET
Abs Immature Granulocytes: 0.09 10*3/uL — ABNORMAL HIGH (ref 0.00–0.07)
Basophils Absolute: 0 10*3/uL (ref 0.0–0.1)
Basophils Relative: 0 %
Eosinophils Absolute: 0.2 10*3/uL (ref 0.0–0.5)
Eosinophils Relative: 2 %
HCT: 24.4 % — ABNORMAL LOW (ref 36.0–46.0)
Hemoglobin: 7.4 g/dL — ABNORMAL LOW (ref 12.0–15.0)
Immature Granulocytes: 1 %
Lymphocytes Relative: 25 %
Lymphs Abs: 2.7 10*3/uL (ref 0.7–4.0)
MCH: 23.6 pg — ABNORMAL LOW (ref 26.0–34.0)
MCHC: 30.3 g/dL (ref 30.0–36.0)
MCV: 78 fL — ABNORMAL LOW (ref 80.0–100.0)
Monocytes Absolute: 0.6 10*3/uL (ref 0.1–1.0)
Monocytes Relative: 5 %
Neutro Abs: 7.4 10*3/uL (ref 1.7–7.7)
Neutrophils Relative %: 67 %
Platelets: 219 10*3/uL (ref 150–400)
RBC: 3.13 MIL/uL — ABNORMAL LOW (ref 3.87–5.11)
RDW: 17 % — ABNORMAL HIGH (ref 11.5–15.5)
WBC: 11 10*3/uL — ABNORMAL HIGH (ref 4.0–10.5)
nRBC: 0 % (ref 0.0–0.2)

## 2021-12-24 LAB — COMPREHENSIVE METABOLIC PANEL
ALT: 16 U/L (ref 0–44)
AST: 19 U/L (ref 15–41)
Albumin: 2.3 g/dL — ABNORMAL LOW (ref 3.5–5.0)
Alkaline Phosphatase: 115 U/L (ref 38–126)
Anion gap: 7 (ref 5–15)
BUN: 7 mg/dL (ref 6–20)
CO2: 23 mmol/L (ref 22–32)
Calcium: 8.3 mg/dL — ABNORMAL LOW (ref 8.9–10.3)
Chloride: 110 mmol/L (ref 98–111)
Creatinine, Ser: 0.62 mg/dL (ref 0.44–1.00)
GFR, Estimated: >60 mL/min (ref >60)
Glucose, Bld: 79 mg/dL (ref 70–99)
Potassium: 4 mmol/L (ref 3.5–5.1)
Sodium: 140 mmol/L (ref 135–145)
Total Bilirubin: 0.2 mg/dL — ABNORMAL LOW (ref 0.3–1.2)
Total Protein: 5.3 g/dL — ABNORMAL LOW (ref 6.5–8.1)

## 2021-12-24 MED ORDER — SODIUM CHLORIDE 0.9 % IV SOLN
INTRAVENOUS | Status: DC | PRN
Start: 2021-12-24 — End: 2021-12-24

## 2021-12-24 MED ORDER — IRON SUCROSE 20 MG/ML IV SOLN
500.0000 mg | Freq: Once | INTRAVENOUS | Status: AC
Start: 2021-12-24 — End: 2021-12-24
  Administered 2021-12-24: 500 mg via INTRAVENOUS
  Filled 2021-12-24: qty 25

## 2021-12-24 MED ORDER — METHYLPREDNISOLONE SODIUM SUCC 125 MG IJ SOLR: 125.0000 mg | Freq: Once | INTRAMUSCULAR | Status: DC | PRN

## 2021-12-24 MED ORDER — DIPHENHYDRAMINE HCL 50 MG/ML IJ SOLN: 25.0000 mg | Freq: Once | INTRAMUSCULAR | Status: DC | PRN

## 2021-12-24 MED ORDER — ALBUTEROL SULFATE (2.5 MG/3ML) 0.083% IN NEBU: 2.5000 mg | INHALATION_SOLUTION | Freq: Once | RESPIRATORY_TRACT | Status: DC | PRN

## 2021-12-24 MED ORDER — EPINEPHRINE PF 1 MG/ML IJ SOLN: 0.3000 mg | Freq: Once | INTRAMUSCULAR | Status: DC | PRN

## 2021-12-24 MED ORDER — SODIUM CHLORIDE 0.9 % IV BOLUS: 500.0000 mL | Freq: Once | INTRAVENOUS | Status: DC | PRN

## 2021-12-24 NOTE — Discharge Summary (Signed)
Obstetrical Discharge Summary  Date of Admission: 12/21/2021 Date of Discharge: 12/24/2021  Primary OB: Chad  Gestational Age at Delivery: [redacted]w[redacted]d   Antepartum complications: GHTN Reason for Admission: IOL secondary to [facility] Date of Delivery: 12/22/2021  Delivered By: Allegra Grana, CNM  Delivery Type: vacuum, outlet Intrapartum complications/course: PIH Anesthesia: epidural Placenta: Delivered and expressed via active management. Intact: yes. To pathology: no.  Laceration: 2nd degree perineal, vaginal Episiotomy: none EBL: 400 Baby: Liveborn female, APGARs 7/9, weight 2790 g.    Discharge Diagnosis: Delivered.   Postpartum course:  Pt ambulating, voiding and stooling without difficulty,  Blood Pressures range from normal to mildly elevated without the development of preeclampsia.  Pt given Iron transfusion 7/26.  Pt intends to breastfeed but has not put infant to breast or used a breast pump since last night.  Pt with hx of anxiety, mood has been up and down.  Medication offered, pt prefers to be discharged home and wait and see.  She and her family believe she will be much better once home.  Discharge Vital Signs:  Current Vital Signs 24h Vital Sign Ranges  T 98.6 F (37 C) Temp  Avg: 98.6 F (37 C)  Min: 98 F (36.7 C)  Max: 99.1 F (37.3 C)  BP 140/83 BP  Min: 111/77  Max: 162/101  HR 96 Pulse  Avg: 93.3  Min: 78  Max: 99  RR 18 Resp  Avg: 17.5  Min: 16  Max: 18  SaO2 100 % Room Air SpO2  Avg: 98.7 %  Min: 97 %  Max: 100 %       24 Hour I/O Current Shift I/O  Time Ins Outs 07/25 0701 - 07/26 0700 In: 240 [P.O.:240] Out: -  No intake/output data recorded.   Discharge Exam:  NAD Perineum: well approximated, slightly swollen Abdomen: firm fundus below the umbilicus, NTTP, non distended, +bowel sounds.   RRR no MRGs CTAB Ext: +1 BLE   Recent Labs  Lab 12/21/21 0838 12/23/21 0545 12/24/21 0918  WBC 11.2* 14.0* 11.0*  HGB 9.2* 7.6* 7.4*  HCT 29.4* 24.5* 24.4*  PLT  282 239 219    Disposition: Home  Rh Immune globulin given: no Rubella vaccine given: no Tdap vaccine given in AP or PP setting: yes Flu vaccine given in AP or PP setting: yes  Contraception: oral progesterone-only contraceptive  Prenatal/Postnatal Panel: O POS Performed at [facility], [address] [place], [place] [address] Ishmael Holter Immune//Varicella Immune//RPR negative//HIV negative/HepB Surface Ag negative//pap ASCUS with NEGATIVE high risk HPV (date: 2020)//plans to breastfeed  Plan:  Natalie Wu was discharged to home in good condition. Follow-up appointment with LMD or other CNM in 1 week for a BP check then in 2 weeks for mood check than 6wks for PP visit  Future Appointments  Date Time Provider Department Center  01/06/2022  3:15 PM Doreene Burke, CNM [facility] None  02/04/2022  1:15 PM Doreene Burke, CNM [facility] None    Discharge Medications: Allergies as of 12/24/2021   No Known Allergies      Medication List     STOP taking these medications    aspirin 81 MG chewable tablet       TAKE these medications    ibuprofen 600 MG tablet Commonly known as: ADVIL Take 1 tablet (600 mg total) by mouth every 6 (six) hours.   labetalol 200 MG tablet Commonly known as: NORMODYNE Take 0.5 tablets (100 mg total) by mouth 2 (two) times daily. What changed:  Another medication with the same name was added. Make sure you understand how and when to take each.   labetalol 200 MG tablet Commonly known as: NORMODYNE Take 1 tablet (200 mg total) by mouth 2 (two) times daily. What changed: You were already taking a medication with the same name, and this prescription was added. Make sure you understand how and when to take each.   multivitamin-prenatal 27-0.8 MG Tabs tablet Take 1 tablet by mouth daily at 12 noon.      Carie Caddy, CNM  Domingo Pulse, [facility]  @TODAY @  2:15 PM

## 2021-12-24 NOTE — Progress Notes (Signed)
Mother discharged. Discharge instructions given. Mother verbalizes understanding. Transported by axillary.  

## 2021-12-24 NOTE — Progress Notes (Signed)
Obstetric Postpartum Daily Progress Note Subjective:  31 y.o. G1P1001 postpartum day #2 status post vaginal delivery.  She is ambulating, is tolerating po, is voiding spontaneously.  Her pain is well controlled on PO pain medications. Her lochia is less than menses. Intends to breastfeed but has not put the infant on the breast in some time. The infant has been given formula.  Kharter has pumped once last night, she got a few ml's of colostrum.  Mood has been up and down, pt frustrated with needing to stay longer d/t infant monitoring, this has caused her anxiety to increase. Pt feels her anxiety will go down once she is home, her partner and mother are present and agree that she will be better once she is home. Pt desires to see how her mood goes once home, would be open to medication if needed. Doe not desire medication right now.  Hgb 7.6, pt's mother states she has needed Iron transfusion in the past when it gets that low. Pt desires iron transfusion. Denies HA, visual disturbances or RUQ pain.    Medications SCHEDULED MEDICATIONS   docusate sodium  100 mg Oral BID   ferrous sulfate  325 mg Oral Q breakfast   ibuprofen  600 mg Oral Q6H   labetalol  200 mg Oral BID   oxytocin 40 units in LR 1000 mL  333 mL Intravenous Once   prenatal multivitamin  1 tablet Oral Q1200   senna-docusate  2 tablet Oral Q24H    MEDICATION INFUSIONS   sodium chloride     iron sucrose (VENOFER) 500 mg in sodium chloride 0.9 % 250 mL IVPB     lactated ringers     lactated ringers Stopped (12/22/21 1831)   oxytocin Stopped (12/22/21 1831)   sodium chloride      PRN MEDICATIONS  sodium chloride, acetaminophen, albuterol, benzocaine-Menthol, coconut oil, witch hazel-glycerin **AND** dibucaine, diphenhydrAMINE, EPINEPHrine, fentaNYL (SUBLIMAZE) injection, hydrOXYzine, lactated ringers, methylPREDNISolone (SOLU-MEDROL) injection, misoprostol, ondansetron, ondansetron **OR** ondansetron (ZOFRAN) IV,  oxyCODONE-acetaminophen, oxyCODONE-acetaminophen, simethicone, sodium chloride, sodium citrate-citric acid    Objective:   Vitals:   12/24/21 0300 12/24/21 0732 12/24/21 0800 12/24/21 0845  BP: 123/86 (!) 162/101 (!) 157/96 (!) 142/92  Pulse: 91 99    Resp: 18 16    Temp: 98.6 F (37 C) 98.7 F (37.1 C)    TempSrc: Oral Oral    SpO2: 99% 98%    Weight:      Height:        Current Vital Signs 24h Vital Sign Ranges  T 98.7 F (37.1 C) Temp  Avg: 98.6 F (37 C)  Min: 98 F (36.7 C)  Max: 99.1 F (37.3 C)  BP (!) 142/92 BP  Min: 111/77  Max: 162/101  HR 99 Pulse  Avg: 92.6  Min: 78  Max: 99  RR 16 Resp  Avg: 17.6  Min: 16  Max: 18  SaO2 98 % Room Air SpO2  Avg: 98.5 %  Min: 97 %  Max: 100 %       24 Hour I/O Current Shift I/O  Time Ins Outs 07/25 0701 - 07/26 0700 In: 240 [P.O.:240] Out: -  No intake/output data recorded.  General: NAD Pulmonary: no increased work of breathing Breasts: soft, no redness or masses, Nipples erect and intact bilaterally. Abdomen: non-distended, non-tender, fundus firm at level of umbilicus Perineum: well approximated, minimal swelling Extremities: +1 edema, no erythema, no tenderness  Labs:  Recent Labs  Lab 12/21/21 0838 12/23/21 0545  12/24/21 0918  WBC 11.2* 14.0* 11.0*  HGB 9.2* 7.6* 7.4*  HCT 29.4* 24.5* 24.4*  PLT 282 239 219     Assessment:   31 y.o. G1P1001 postpartum day # 2 status post vacuum assist birth   Plan:   1) Acute blood loss anemia - hemodynamically stable and asymptomatic - Iron transfusion ordered   2) O POS Performed at [facility], [address] [place], [place] [address]  / Ishmael Holter 2.66 (01/17 1033)/ Varicella Immune  3) TDAP status UP to date  4) breast and bottle -reviewed importance of frequent breast stimulation in order for milk to come in. /Contraception = oral progesterone-only contraceptive  5) GHTN with elevated Blood pressures, PIH labs pending, may increase Labetalol   5)  Disposition discharge later today based on labs, BP reading.   Ellouise Newer Lilliemae Fruge, CNM  12/24/2021 9:56 AM

## 2021-12-25 ENCOUNTER — Encounter: Payer: Self-pay | Admitting: Oncology

## 2021-12-26 DIAGNOSIS — O133 Gestational [pregnancy-induced] hypertension without significant proteinuria, third trimester: Secondary | ICD-10-CM

## 2021-12-26 NOTE — Final Progress Note (Signed)
L&D OB Triage Note  Natalie Wu is a 31 y.o. G1P1001 female at [redacted]w[redacted]d, EDD Estimated Date of Delivery: 01/10/22 who presented to triage for NST due to history of GHTN.  She had 2 severe range BPs yesterday. She is checking BPs at home and has collected a 24 hour urine. She received a dose of betamethasone yesterday.  She was evaluated by the nurses with no significant findings. Vital signs stable. An NST was performed and has been reviewed by MD.    NST INTERPRETATION: Indications: pregnancy-induced hypertension  Mode: External Baseline Rate (A): 150 bpm Variability: Moderate Accelerations: 15 x 15 Decelerations: None     Contraction Frequency (min): x1 ctx  Impression: reactive   Plan: NST performed was reviewed and was found to be reactive. Patient offered second dose of betamethasone, however declines today.  Labetalol dosing was increased to 200 mg BID yesterday.She was discharged home with PIH and labor precautions.  Wills send collected 24 hr protein to lab for evaluation. Continue routine prenatal care. Follow up with OB/GYN as previously scheduled. Is scheduled for induction of labor for GHTN on 12/21/2021.     Hildred Laser, MD [facility]

## 2021-12-30 ENCOUNTER — Encounter: Payer: Self-pay | Admitting: Oncology

## 2022-01-01 ENCOUNTER — Encounter: Payer: Self-pay | Admitting: Obstetrics

## 2022-01-01 ENCOUNTER — Encounter: Payer: Self-pay | Admitting: Oncology

## 2022-01-01 ENCOUNTER — Ambulatory Visit (INDEPENDENT_AMBULATORY_CARE_PROVIDER_SITE_OTHER): Payer: 59 | Admitting: Obstetrics

## 2022-01-01 DIAGNOSIS — O133 Gestational [pregnancy-induced] hypertension without significant proteinuria, third trimester: Secondary | ICD-10-CM

## 2022-01-01 NOTE — Progress Notes (Signed)
Obstetrics & Gynecology Office Visit   Chief Complaint:  Chief Complaint  Patient presents with   Blood Pressure Check    History of Present Illness: 31 y.o. G1P1001 being seen for follow up blood pressure check today.  The patient is  approximately 1.5 weeks post delivery. The established diagnosis for the patient is gestational hypertension.  She is currently on labetalol 200mg .  She reports no current symptoms attributable to her blood pressure.  Medication list reviewed medications which may contribute to BP elevation were not noted, no medications contraindicated for use in patient with current hypertension were noted.  Review of Systems: Review of Systems  Constitutional: Negative.   HENT: Negative.    Eyes: Negative.   Respiratory: Negative.    Cardiovascular: Negative.   Gastrointestinal: Negative.   Genitourinary: Negative.   Musculoskeletal: Negative.   Skin: Negative.   Neurological: Negative.   Endo/Heme/Allergies: Negative.   Psychiatric/Behavioral: Negative.       Past Medical History:  Past Medical History:  Diagnosis Date   Complication of anesthesia    Slow to wake up   GERD (gastroesophageal reflux disease)    Iron deficiency anemia    Migraine     Past Surgical History:  Past Surgical History:  Procedure Laterality Date   CHOLECYSTECTOMY N/A 04/03/2019   Procedure: LAPAROSCOPIC CHOLECYSTECTOMY;  Surgeon: 13/07/2018, MD;  Location: [facility] ORS;  Service: General;  Laterality: N/A;   FOOT SURGERY     TONSILLECTOMY     WISDOM TOOTH EXTRACTION      Gynecologic History: No LMP recorded.  Obstetric History: G1P1001  Family History:  Family History  Problem Relation Age of Onset   Hyperlipidemia Father    Hypertension Father    Breast cancer Maternal Grandmother    Lung cancer Maternal Grandfather    Bone cancer Paternal Grandmother    Cancer Paternal Grandmother    Breast cancer Maternal Uncle     Social History:  Social History    Socioeconomic History   Marital status: Significant Other    Spouse name: Henrene Dodge   Number of children: Not on file   Years of education: Not on file   Highest education level: Not on file  Occupational History   Occupation: CNA    Employer: [employer]  Tobacco Use   Smoking status: Never   Smokeless tobacco: Never  Vaping Use   Vaping Use: Never used  Substance and Sexual Activity   Alcohol use: Not Currently    Comment: occasional   Drug use: No   Sexual activity: Yes    Comment: planning pill  Other Topics Concern   Not on file  Social History Narrative   Single   Has twin sister, Ronaldo Miyamoto in high school   Not sexually active   Wants to be physical therapist   Social Determinants of Health   Financial Resource Strain: Not on file  Food Insecurity: Not on file  Transportation Needs: Not on file  Physical Activity: Not on file  Stress: Not on file  Social Connections: Not on file  Intimate Partner Violence: Not on file    Allergies:  No Known Allergies  Medications: Prior to Admission medications   Medication Sig Start Date End Date Taking? Authorizing Provider  labetalol (NORMODYNE) 200 MG tablet Take 0.5 tablets (100 mg total) by mouth 2 (two) times daily. 12/09/21  Yes Swanson, 02/09/22, CNM  Prenatal Vit-Fe Fumarate-FA (MULTIVITAMIN-PRENATAL) 27-0.8 MG TABS tablet Take 1 tablet  by mouth daily at 12 noon.   Yes [provider]    Physical Exam Blood pressure 128/80, height 5\' 3"  (1.6 m), weight 211 lb (95.7 kg), unknown if currently breastfeeding.  No LMP recorded.  General: NAD Breast exam- leaking bilaterally. There is a left breast blocked ducted noted at the 8-9 o'clock location on the breast. There breasts are over full. HEENT: normocephalic, anicteric Pulmonary: No increased work of breathing Cardiovascular: RRR, distal pulses 2+ Extremities: noedema, no erythema, no tenderness Neurologic: Grossly intact Psychiatric: mood  appropriate, affect full  Assessment: 31 y.o. G1P1001 presenting for blood pressure evaluation today Blocked breast duct  Plan: Problem List Items Addressed This Visit       Cardiovascular and Mediastinum   Gestational hypertension, third trimester     Other   Postpartum care following vaginal delivery - Primary    1) Blood pressure - blood pressure at today's visit is normotensive.  As a result  she is praised and will continue on the Labetalol for a month. . - additional blood work was not obtained Additional time spent providing lactation assistance. She will pump the breasts, then apply ice, and then use cabbage leaves for the rest of the day. She is scheduled for a return visit in several weeks .   37, CNM  01/01/2022 11:25 AM   [facility], [facility] 01/01/2022, 11:25 AM

## 2022-01-06 ENCOUNTER — Encounter: Payer: Medicaid Other | Admitting: Certified Nurse Midwife

## 2022-01-07 ENCOUNTER — Encounter: Payer: Self-pay | Admitting: Licensed Practical Nurse

## 2022-01-07 ENCOUNTER — Ambulatory Visit (INDEPENDENT_AMBULATORY_CARE_PROVIDER_SITE_OTHER): Payer: 59 | Admitting: Licensed Practical Nurse

## 2022-01-08 NOTE — Progress Notes (Signed)
Postpartum Visit  Chief Complaint:  Chief Complaint  Patient presents with   Postpartum Care    History of Present Illness: Patient is a 31 y.o. G1P1001 presents for postpartum visit. Here with Natalie Wu and mother Date of delivery: 12/22/2021 Type of delivery: Vaginal delivery - Vacuum or forceps assisted  yes Episiotomy No.  Laceration: yes  Pregnancy or labor problems:  GHTN Any problems since the delivery:  no Bleeding is only spotting Mood has gotten better, had some anxiety at first, both she and her mother feel she is at better place Stopped taking Labetalol 3 days ago, her BP have been normal and is worried about "bottoming out" No concerns with voiding or stooling or perineum  Desires OPC/POP for contraception Has been going for walks when able,   Newborn Details:  SINGLETON :  1. Baby's name: Natalie Wu. Birth weight: 2790grams Maternal Details:  Breast Feeding:  no Post partum depression/anxiety noted:  no, but monitor for anxiety  Edinburgh Post-Partum Depression Score:  0  Date of last PAP: 08/19/2018  ASC-US HPV neg    Past Medical History:  Diagnosis Date   Complication of anesthesia    Slow to wake up   GERD (gastroesophageal reflux disease)    Iron deficiency anemia    Migraine     Past Surgical History:  Procedure Laterality Date   CHOLECYSTECTOMY N/A 04/03/2019   Procedure: LAPAROSCOPIC CHOLECYSTECTOMY;  Surgeon: Henrene Dodge, MD;  Location: [facility] ORS;  Service: General;  Laterality: N/A;   FOOT SURGERY     TONSILLECTOMY     WISDOM TOOTH EXTRACTION      Prior to Admission medications   Medication Sig Start Date End Date Taking? Authorizing Provider  labetalol (NORMODYNE) 200 MG tablet Take 0.5 tablets (100 mg total) by mouth 2 (two) times daily. 12/09/21   Glenetta Borg, CNM  Prenatal Vit-Fe Fumarate-FA (MULTIVITAMIN-PRENATAL) 27-0.8 MG TABS tablet Take 1 tablet by mouth daily at 12 noon.    [provider]    No Known Allergies    Social History   Socioeconomic History   Marital status: Significant Other    Spouse name: Natalie Wu   Number of children: Not on file   Years of education: Not on file   Highest education level: Not on file  Occupational History   Occupation: CNA    Employer: [employer]  Tobacco Use   Smoking status: Never   Smokeless tobacco: Never  Vaping Use   Vaping Use: Never used  Substance and Sexual Activity   Alcohol use: Not Currently    Comment: occasional   Drug use: No   Sexual activity: Yes    Comment: planning pill  Other Topics Concern   Not on file  Social History Narrative   Single   Has twin sister, Programmer, applications in high school   Not sexually active   Wants to be physical therapist   Social Determinants of Health   Financial Resource Strain: Not on file  Food Insecurity: Not on file  Transportation Needs: Not on file  Physical Activity: Not on file  Stress: Not on file  Social Connections: Not on file  Intimate Partner Violence: Not on file    Family History  Problem Relation Age of Onset   Hyperlipidemia Father    Hypertension Father    Breast cancer Maternal Grandmother    Lung cancer Maternal Grandfather    Bone cancer Paternal Grandmother    Cancer Paternal Grandmother    Breast  cancer Maternal Uncle     Review of Systems  Constitutional: Negative.   Respiratory: Negative.    Cardiovascular: Negative.   Gastrointestinal: Negative.   Genitourinary: Negative.   Musculoskeletal: Negative.   Psychiatric/Behavioral:  Negative for depression. The patient is not nervous/anxious.      Physical Exam BP 126/84   Wt 206 lb (93.4 kg)   BMI 36.49 kg/m   Physical Exam Constitutional:      Appearance: Normal appearance.  Genitourinary:     Genitourinary Comments: Declined   Abdominal:     General: There is no distension.     Tenderness: There is no abdominal tenderness.     Comments: Uterus not palpated in abd   Musculoskeletal:     Right lower  leg: No edema.     Left lower leg: No edema.  Neurological:     Mental Status: She is alert.  Psychiatric:        Mood and Affect: Mood normal.        Assessment: 31 y.o. G1P1001 presenting for 2 week postpartum visit  Plan: Problem List Items Addressed This Visit   None    1) Contraception Education given regarding options for contraception, including  will assess blood pressure at next visit, if BP WNL may consider OCP .  2)  Pap - ASCCP guidelines and rational discussed.  Patient opts for 3 screening interval Due at 6 week visit  3) Patient underwent screening for postpartum depression with NO concerns noted.But pay attention to anxiety  4) GHTN: normotensive today, ok to stay off of meds but continue to monitor BP at home  6) Follow up 4 wks for 6 wk PP Natalie Wu, CNM  [place], [facility]  01/08/22  12:40 PM

## 2022-01-26 ENCOUNTER — Other Ambulatory Visit (HOSPITAL_COMMUNITY)
Admission: RE | Admit: 2022-01-26 | Discharge: 2022-01-26 | Disposition: A | Discharge status: Home / Self Care | Payer: 59 | Source: Ambulatory Visit | Attending: Licensed Practical Nurse | Admitting: Licensed Practical Nurse

## 2022-01-26 ENCOUNTER — Ambulatory Visit (INDEPENDENT_AMBULATORY_CARE_PROVIDER_SITE_OTHER): Payer: 59 | Admitting: Licensed Practical Nurse

## 2022-01-26 ENCOUNTER — Encounter: Payer: Self-pay | Admitting: Licensed Practical Nurse

## 2022-01-26 DIAGNOSIS — Z124 Encounter for screening for malignant neoplasm of cervix: Secondary | ICD-10-CM | POA: Insufficient documentation

## 2022-01-26 DIAGNOSIS — H52213 Irregular astigmatism, bilateral: Secondary | ICD-10-CM | POA: Diagnosis not present

## 2022-01-26 DIAGNOSIS — Z30011 Encounter for initial prescription of contraceptive pills: Secondary | ICD-10-CM

## 2022-01-26 MED ORDER — NORGESTIMATE-ETH ESTRADIOL 0.25-35 MG-MCG PO TABS: 1.0000 | ORAL_TABLET | Freq: Every day | ORAL | 11 refills | Status: DC

## 2022-01-26 NOTE — Progress Notes (Signed)
Postpartum Visit  Chief Complaint:  Chief Complaint  Patient presents with   Postpartum Care    History of Present Illness: Patient is a 31 y.o. G1P1001 presents for postpartum visit. Date of delivery: 12/22/2021 Type of delivery: Vaginal delivery - Vacuum or forceps assisted  yes Episiotomy No.  Laceration: yes  Pregnancy or labor problems:  GHTN Any problems since the delivery:  no Bleeding is only spotting Mood has been good  No concerns with voiding or stooling or perineum  Has not had IC yet  Desires OPC for contraception, unsure about another child, plans to wait a few years.  Is going for walks, plans to start going to the gym and make changes to her diet, is back to NOB weight, desires to lose more.  Returning to work in October, feels good about going back to work  Dental exam on Thursday Eye exam later today   Newborn Details:  SINGLETON :  1. Baby's name: Natalie Wu. Birth weight: 2790grams Maternal Details:  Breast Feeding:  no, has had to switch to Nutramigen d/t stomach issues.  Post partum depression/anxiety noted:  no,  Edinburgh Post-Partum Depression Score:  0  Date of last PAP: 08/19/2018  ASC-US HPV neg    Past Medical History:  Diagnosis Date   Complication of anesthesia    Slow to wake up   GERD (gastroesophageal reflux disease)    Iron deficiency anemia    Migraine     Past Surgical History:  Procedure Laterality Date   CHOLECYSTECTOMY N/A 04/03/2019   Procedure: LAPAROSCOPIC CHOLECYSTECTOMY;  Surgeon: Natalie Dodge, MD;  Location: [facility] ORS;  Service: General;  Laterality: N/A;   FOOT SURGERY     TONSILLECTOMY     WISDOM TOOTH EXTRACTION      Prior to Admission medications   Medication Sig Start Date End Date Taking? Authorizing Provider  norgestimate-ethinyl estradiol (SPRINTEC 28) 0.25-35 MG-MCG tablet Take 1 tablet by mouth daily. 01/26/22  Yes Natalie Wu, Natalie Wu, CNM  Prenatal Vit-Fe Fumarate-FA (MULTIVITAMIN-PRENATAL) 27-0.8 MG TABS  tablet Take 1 tablet by mouth daily at 12 noon.   Yes [provider]    No Known Allergies   Social History   Socioeconomic History   Marital status: Significant Other    Spouse name: Natalie Wu   Number of children: Not on file   Years of education: Not on file   Highest education level: Not on file  Occupational History   Occupation: CNA    Employer: [employer]  Tobacco Use   Smoking status: Never   Smokeless tobacco: Never  Vaping Use   Vaping Use: Never used  Substance and Sexual Activity   Alcohol use: Not Currently    Comment: occasional   Drug use: No   Sexual activity: Not Currently    Birth control/protection: None  Other Topics Concern   Not on file  Social History Narrative   Single   Has twin sister, Programmer, applications in high school   Not sexually active   Wants to be physical therapist   Social Determinants of Health   Financial Resource Strain: Not on file  Food Insecurity: Not on file  Transportation Needs: Not on file  Physical Activity: Not on file  Stress: Not on file  Social Connections: Not on file  Intimate Partner Violence: Not on file    Family History  Problem Relation Age of Onset   Hyperlipidemia Father    Hypertension Father    Breast cancer Maternal Grandmother  Lung cancer Maternal Grandfather    Bone cancer Paternal Grandmother    Cancer Paternal Grandmother    Breast cancer Maternal Uncle     Review of Systems  Constitutional: Negative.   Eyes:        Change in vision   Respiratory: Negative.    Cardiovascular: Negative.   Gastrointestinal: Negative.   Genitourinary: Negative.   Musculoskeletal: Negative.   Neurological: Negative.   Psychiatric/Behavioral: Negative.       Physical Exam BP 106/70   Ht 5\' 3"  (1.6 m)   Wt 206 lb (93.4 kg)   Breastfeeding No   BMI 36.49 kg/m   Physical Exam Constitutional:      Appearance: Normal appearance.  Genitourinary:     Vulva normal.     Genitourinary Comments:  Cervix pink, no lesions Bimanual exam: uterus non tender, non gravid size, adnexa non tender no masses, not enlarged   Cardiovascular:     Rate and Rhythm: Normal rate and regular rhythm.     Heart sounds: Normal heart sounds.  Pulmonary:     Effort: Pulmonary effort is normal.     Breath sounds: Normal breath sounds.  Abdominal:     General: Abdomen is flat. There is no distension.     Tenderness: There is no abdominal tenderness.  Musculoskeletal:        General: Normal range of motion.     Cervical back: Normal range of motion.     Right lower leg: No edema.     Left lower leg: No edema.  Neurological:     Mental Status: She is alert.  Skin:    General: Skin is warm.  Psychiatric:        Mood and Affect: Mood normal.      Assessment: 31 y.o. G1P1001 presenting for 6 week postpartum visit  Plan: Problem List Items Addressed This Visit   None Visit Diagnoses     Postpartum exam    -  Primary   Relevant Medications   norgestimate-ethinyl estradiol (SPRINTEC 28) 0.25-35 MG-MCG tablet   Other Relevant Orders   Cytology - PAP   Encounter for screening for cervical cancer       Relevant Orders   Cytology - PAP   Encounter for initial prescription of contraceptive pills       Relevant Medications   norgestimate-ethinyl estradiol (SPRINTEC 28) 0.25-35 MG-MCG tablet        1) Contraception Education given regarding options for contraception, including oral contraceptives. OCP's prescribed   2)  Pap - ASCCP guidelines and rational discussed.  Patient opts for 3 year screening interval  3) Patient underwent screening for postpartum depression with NO concerns noted.  4) Follow up 1 year for routine annual exam  02-15-1999, CNM  Natalie Wu, [facility]  01/26/22  12:52 PM

## 2022-01-28 ENCOUNTER — Encounter: Payer: Self-pay | Admitting: Oncology

## 2022-01-29 ENCOUNTER — Other Ambulatory Visit: Payer: Self-pay | Admitting: Licensed Practical Nurse

## 2022-01-29 DIAGNOSIS — F419 Anxiety disorder, unspecified: Secondary | ICD-10-CM

## 2022-01-29 MED ORDER — ESCITALOPRAM OXALATE 10 MG PO TABS: 10.0000 mg | ORAL_TABLET | Freq: Every day | ORAL | 0 refills | Status: DC

## 2022-01-29 MED ORDER — HYDROXYZINE HCL 10 MG PO TABS: 10.0000 mg | ORAL_TABLET | Freq: Three times a day (TID) | ORAL | 0 refills | Status: DC | PRN

## 2022-01-29 NOTE — Progress Notes (Signed)
Natalie Wu experiencing a lot of anxiety related to her infant, she has lots of concerns about her baby being ill. Natalie Wu has been sneezing, coughing, and sounding nasally. The pediatrician has reassured her that every thing is normal.  Natalie Wu has not been able to sleep or eat.  She has been crying most days.  Desires treatment for anxiety.  Will start Lexapro 42m daily Hydroxyzine 10mg  TID PRN Will call in the am and scheduled a fu apt in 2-3 weeks , CNM  [facility], [facility]  01/29/22  9:10 PM

## 2022-02-03 LAB — CYTOLOGY - PAP: Diagnosis: NEGATIVE

## 2022-02-04 ENCOUNTER — Encounter: Payer: Self-pay | Admitting: Certified Nurse Midwife

## 2022-02-04 ENCOUNTER — Other Ambulatory Visit: Payer: Self-pay | Admitting: Licensed Practical Nurse

## 2022-02-04 MED ORDER — METRONIDAZOLE 500 MG PO TABS: ORAL_TABLET | ORAL | 0 refills | Status: DC

## 2022-02-04 NOTE — Progress Notes (Signed)
Pap came back with positive trich, reviewed this could be a false positive as she has not been sexually active and she and her partner are monogamous.  Offered retest with Aptima swab and or treatment. Pt desires treatment for for her and her partner.  Script sent to pharmacy on file Carie Caddy, CNM  [facility]  02/04/22  10:50 AM

## 2022-02-12 ENCOUNTER — Ambulatory Visit: Payer: 59 | Admitting: Licensed Practical Nurse

## 2022-02-21 ENCOUNTER — Other Ambulatory Visit: Payer: Self-pay | Admitting: Licensed Practical Nurse

## 2022-02-21 DIAGNOSIS — F419 Anxiety disorder, unspecified: Secondary | ICD-10-CM

## 2022-03-13 ENCOUNTER — Other Ambulatory Visit: Payer: Self-pay | Admitting: Licensed Practical Nurse

## 2022-03-13 DIAGNOSIS — F419 Anxiety disorder, unspecified: Secondary | ICD-10-CM

## 2022-03-13 MED ORDER — SERTRALINE HCL 50 MG PO TABS: 50.0000 mg | ORAL_TABLET | Freq: Every day | ORAL | 1 refills | Status: DC

## 2022-03-13 NOTE — Progress Notes (Signed)
Pt started on Lexapro for anxiety, only took it a few times, she did not like the side effects so stopped.  Admits to having a really difficult time, she has been crying most days and worries about everything.  She has been dealing with digestive issues with her infant which worries her. She would be interested in therapy but it not sure how she could schedule it  as she works full time and doe snot have PTO.   Will start 25mg  Zoloft daily x 1 week then increase to 50mg  daily  Will schedule an apt in 2-3 weeks Natalie Wu, [place], [facility]  03/13/22  12:15 PM

## 2022-04-02 ENCOUNTER — Ambulatory Visit: Payer: 59 | Admitting: Licensed Practical Nurse

## 2022-04-05 ENCOUNTER — Other Ambulatory Visit: Payer: Self-pay

## 2022-04-05 DIAGNOSIS — F419 Anxiety disorder, unspecified: Secondary | ICD-10-CM

## 2022-05-04 ENCOUNTER — Other Ambulatory Visit: Payer: Self-pay | Admitting: Nurse Practitioner

## 2022-05-04 MED ORDER — PREDNISONE 10 MG PO TABS: 10.0000 mg | ORAL_TABLET | Freq: Every day | ORAL | 0 refills | Status: AC

## 2022-05-14 ENCOUNTER — Encounter: Payer: Self-pay | Admitting: Oncology

## 2022-05-15 IMAGING — US US OB FOLLOW-UP
1 series · 15 of 28 positions shown · non-contrast
Comparison: 08/11/2021

CLINICAL DATA: Re-evaluation of the spine, RVOT, LVOT and upper
lip.

EXAM:
OBSTETRIC 14+ WK ULTRASOUND FOLLOW-UP

[Series 1: us ob follow up · 15 of 52 slices shown]
[im 1/52]
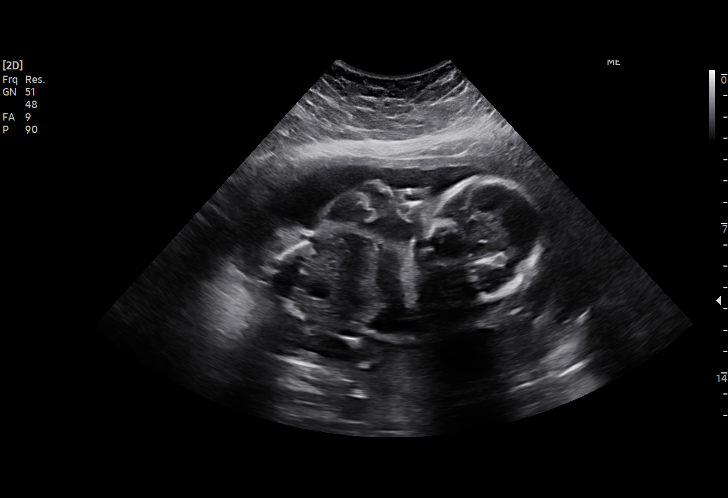
[im 4/52]
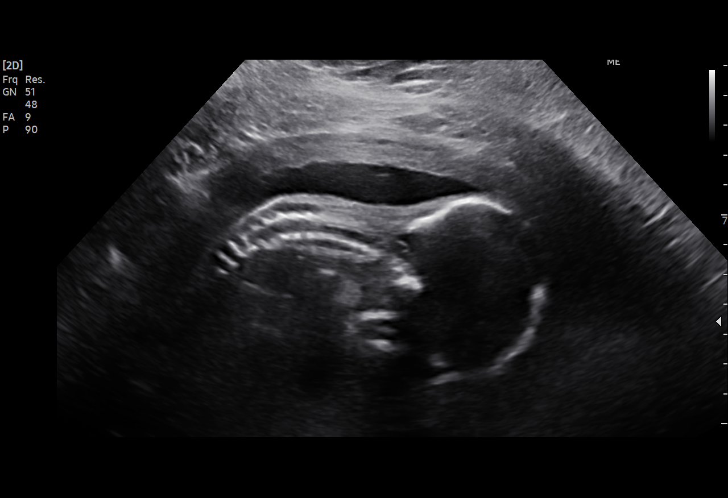
[im 8/52]
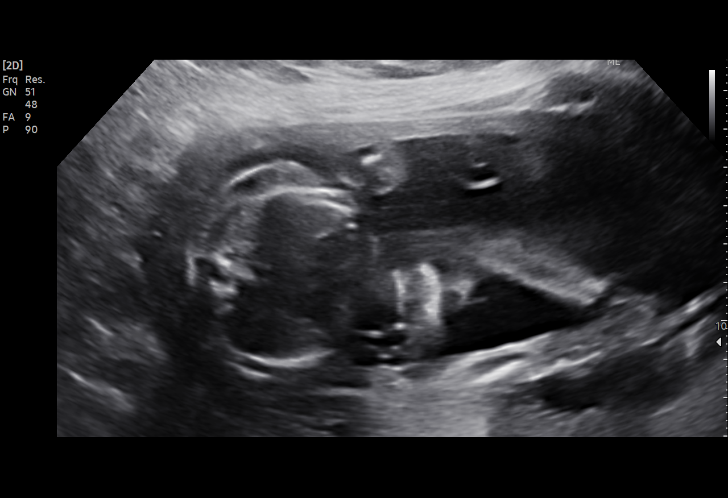
[im 12/52]
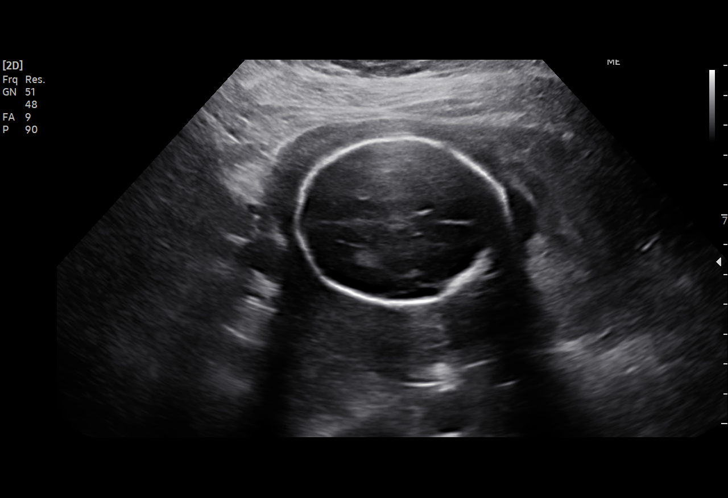
[im 16/52]
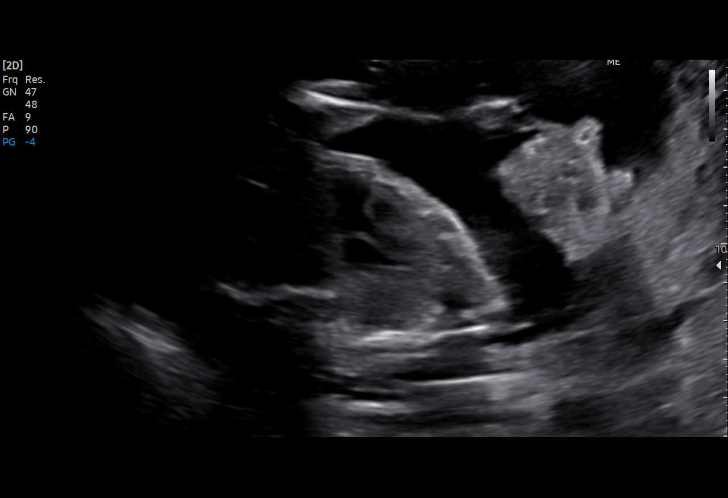
[im 19/52]
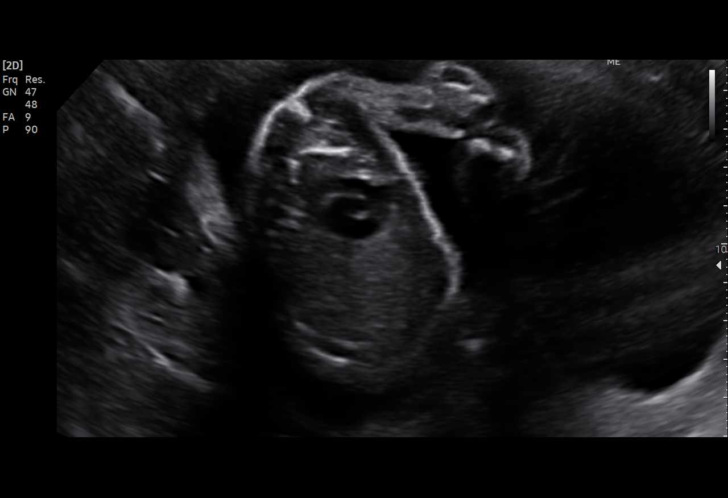
[im 23/52]
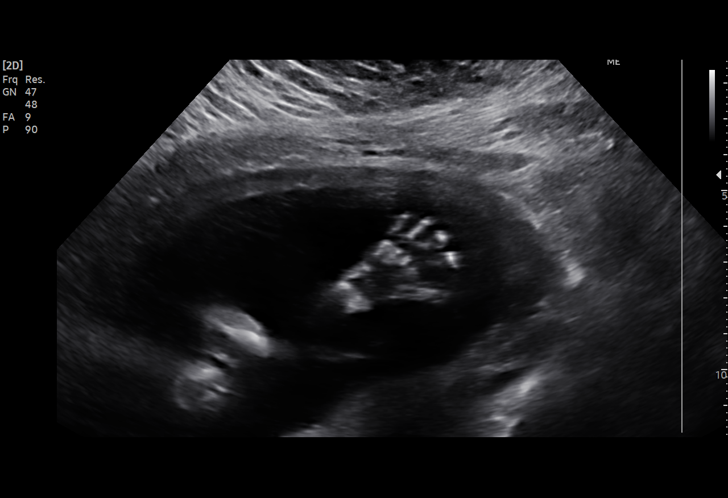
[im 27/52]
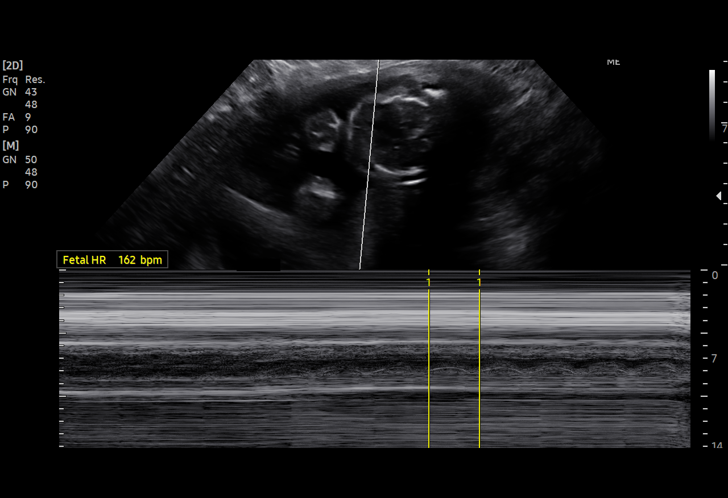
[im 29/52]
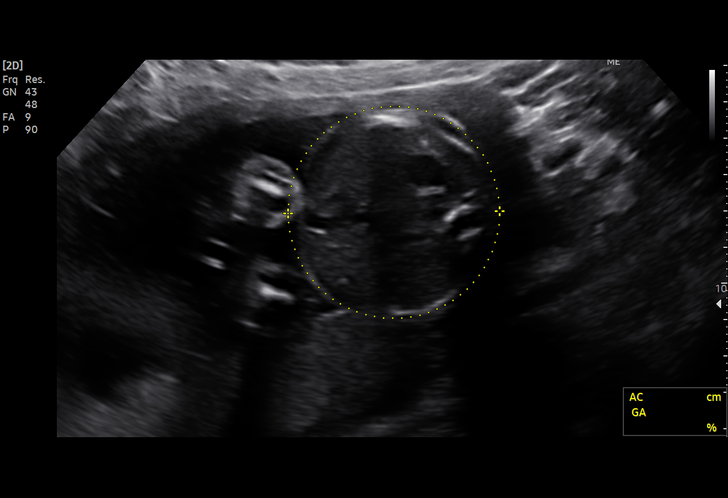
[im 33/52]
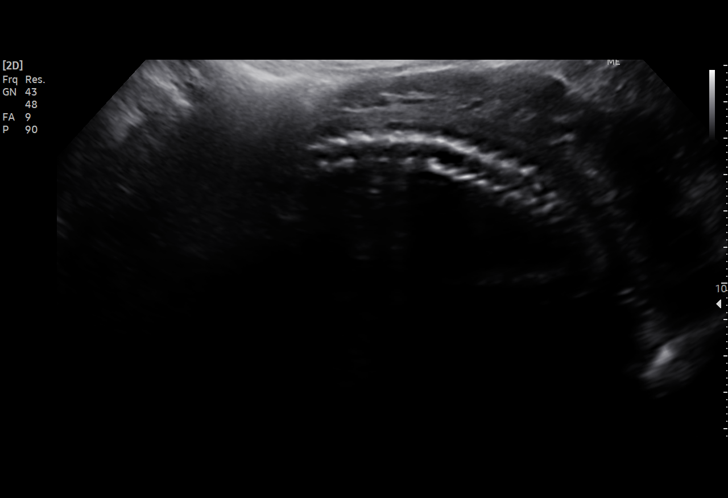
[im 36/52]
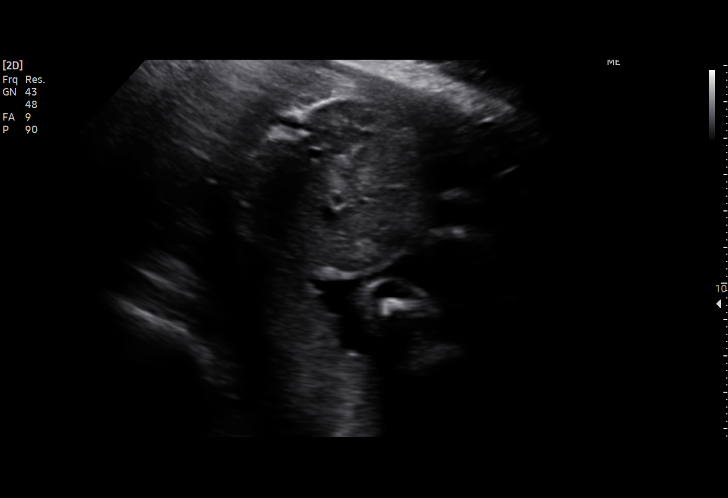
[im 40/52]
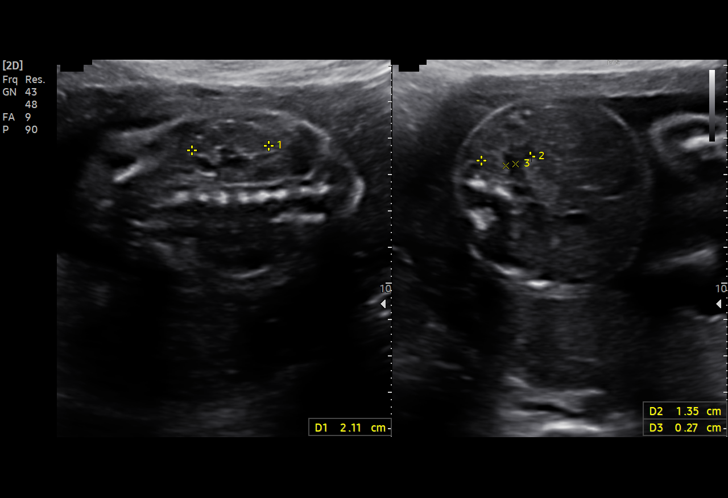
[im 44/52]
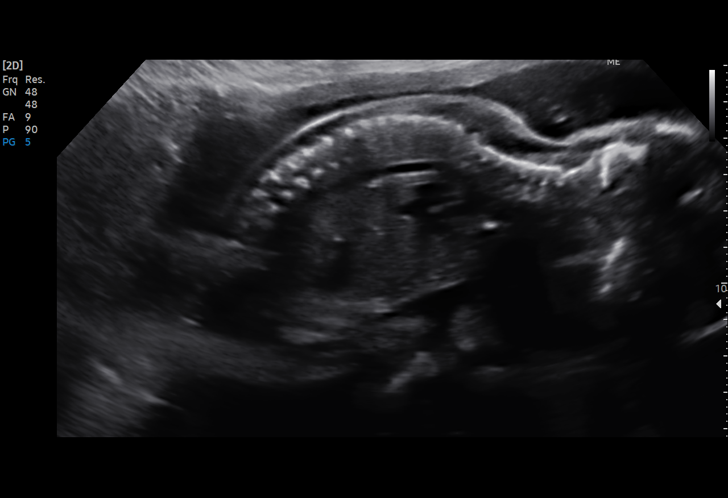
[im 48/52]
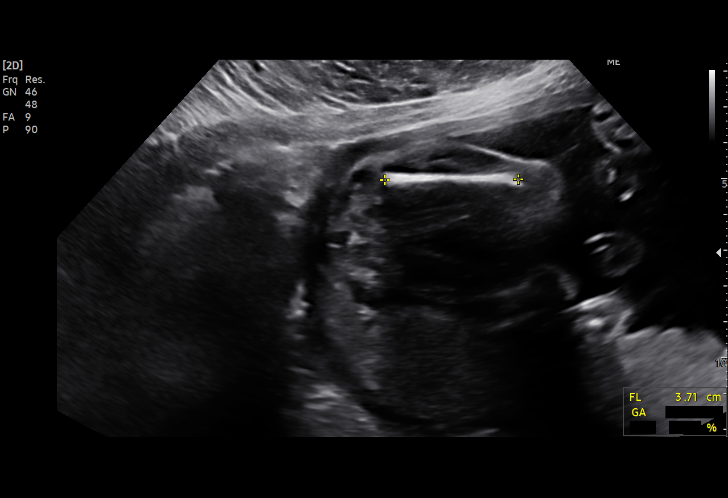
[im 52/52]
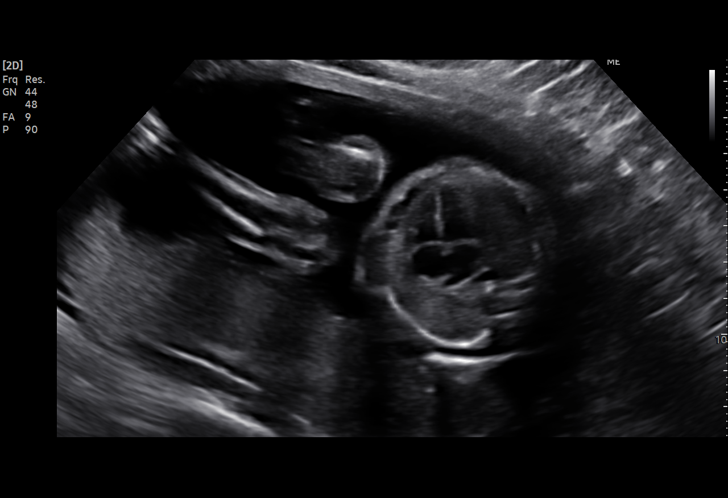

[15 of 28 positions shown; findings below may reference images not displayed]

FINDINGS: Number of Fetuses: 1

Heart Rate:  162 bpm

Movement: Yes

Presentation: Transverse

Previa: No

Placental Location: Posterior

Amniotic Fluid (Subjective): Normal

Amniotic Fluid (Objective):

Vertical pocket = 5cm

FETAL BIOMETRY

BPD: 5.6cm 23w 0d

HC:   20.4cm 22w 4d

AC:   18.1cm 23w 0d

FL:   3.8cm 22w 0d

Current Mean GA: 22w 4d US EDC: 01/11/2022

Assigned GA:  22w 5d Assigned EDC: 01/10/2022

FETAL ANATOMY

Lateral Ventricles: Appears normal

Thalami/CSP: Appears normal

Posterior Fossa:  Previously seen

Nuchal Region: Previously seen   NFT= N/A > 20 WKS

Upper Lip: Appears normal

Spine: Appears normal

4 Chamber Heart on Left: Appears normal

LVOT: Appears normal

RVOT: Appears normal

Stomach on Left: Appears normal

3 Vessel Cord: Previously seen

Cord Insertion site: Previously seen

Kidneys: Appears normal

Bladder: Appears normal

Extremities: Previously seen

Sex: Male

Technically difficult due to: None
IMPRESSION: 1. Single live intrauterine gestation in transverse presentation.
2. Fetal spine and ventricular outflow tracts are within normal
limits.

## 2022-05-16 ENCOUNTER — Encounter: Payer: Self-pay | Admitting: Oncology

## 2022-05-18 ENCOUNTER — Encounter: Payer: Self-pay | Admitting: Oncology

## 2022-06-12 ENCOUNTER — Other Ambulatory Visit: Payer: Self-pay | Admitting: Licensed Practical Nurse

## 2022-06-12 DIAGNOSIS — N644 Mastodynia: Secondary | ICD-10-CM

## 2022-06-12 NOTE — Progress Notes (Signed)
SUBJECTIVE:  Natalie Wu has had pain in her left breast x 1 week. Initially she only had pain when she was laying on her stomach.  Now the pain appears "out of nowhere".  The pain is towards her left armpit and travels over the top of her breast. The pain is achy and dull. She has tried Ibuprofen, decreasing her caffeine and heat.  The heat may help a little.  She has been on OCP's since 6 wks PP, her cycle is due to start next week. She has never experienced this pain before. Denies any recent injuries or using her body in an abnormal way-no extreme heavy lifting etc.  She does get a pain and numbness in  her midback.left side, when that occurs the pain will travel to her left breast.  Her grandmother has had multiple occurences of breast cancer, her first diagnosis  was in her 27's. Natalie Wu denies any changes to the sensation in her breasts they do not feel heavy, denies any changes the size of skin texture of her breasts. Denies any changes to her breasts.   She did not a red tender area in her Right breast a few days ago, that area has improved with warm compresses.   OBJECTIVE:  Left breast: soft, no masses or redness, skin smooth and intact. No tenderness with palpation. Nipple intact.  Right breast: soft, no masses. One pinpoint reddened area on inner lower quadrant, non tender. Skin smooth and intact.  ASSESSMENT: Left breast pain   PLAN: Reviwed breast pain is often non concerning. It could be related to her cycle or OCP's, there is no need to stop OCP's. Given family history and pt's level of concern, will order Breast US   Raysean Graumann, [facility]  06/12/22  5:08 PM

## 2022-06-14 ENCOUNTER — Other Ambulatory Visit: Payer: Self-pay | Admitting: Nurse Practitioner

## 2022-06-14 MED ORDER — CYCLOBENZAPRINE HCL 10 MG PO TABS: 5.0000 mg | ORAL_TABLET | Freq: Three times a day (TID) | ORAL | 0 refills | Status: DC | PRN

## 2022-06-14 MED ORDER — TRAMADOL HCL 50 MG PO TABS: 50.0000 mg | ORAL_TABLET | Freq: Four times a day (QID) | ORAL | 0 refills | Status: AC | PRN

## 2022-07-13 ENCOUNTER — Other Ambulatory Visit: Payer: Self-pay | Admitting: Nurse Practitioner

## 2022-07-13 MED ORDER — AMOXICILLIN-POT CLAVULANATE 875-125 MG PO TABS: 1.0000 | ORAL_TABLET | Freq: Two times a day (BID) | ORAL | 0 refills | Status: AC

## 2022-07-20 ENCOUNTER — Other Ambulatory Visit: Payer: Self-pay | Admitting: Nurse Practitioner

## 2022-07-20 MED ORDER — FLUCONAZOLE 150 MG PO TABS: 150.0000 mg | ORAL_TABLET | Freq: Once | ORAL | 0 refills | Status: AC

## 2022-08-11 ENCOUNTER — Encounter: Payer: Self-pay | Admitting: Oncology

## 2022-08-18 ENCOUNTER — Ambulatory Visit (INDEPENDENT_AMBULATORY_CARE_PROVIDER_SITE_OTHER): Payer: 59 | Admitting: Family Medicine

## 2022-08-18 ENCOUNTER — Encounter (INDEPENDENT_AMBULATORY_CARE_PROVIDER_SITE_OTHER): Payer: Self-pay | Admitting: Family Medicine

## 2022-08-18 VITALS — BP 139/95 | HR 93 | Temp 98.0°F | Ht 64.0 in | Wt 201.0 lb

## 2022-08-18 DIAGNOSIS — E559 Vitamin D deficiency, unspecified: Secondary | ICD-10-CM

## 2022-08-18 DIAGNOSIS — R030 Elevated blood-pressure reading, without diagnosis of hypertension: Secondary | ICD-10-CM | POA: Diagnosis not present

## 2022-08-18 DIAGNOSIS — Z6834 Body mass index (BMI) 34.0-34.9, adult: Secondary | ICD-10-CM

## 2022-08-18 DIAGNOSIS — Z0289 Encounter for other administrative examinations: Secondary | ICD-10-CM

## 2022-08-18 DIAGNOSIS — E669 Obesity, unspecified: Secondary | ICD-10-CM

## 2022-08-18 DIAGNOSIS — R6339 Other feeding difficulties: Secondary | ICD-10-CM

## 2022-08-18 NOTE — Assessment & Plan Note (Signed)
Last vitamin D Lab Results  Component Value Date   VD25OH 26.77 (L) 10/08/2020   She has a hx of vitamin D def, currently not on any supplements.  C/o fatigue.  Plan: check Vitamin D level with next labs.  Aim for a goal of 50-70.

## 2022-08-18 NOTE — Assessment & Plan Note (Signed)
BP elevated today. She was in a rush to get here. Denies CP or HA.  Monitors BP at work and has been running 120s/80s. Plan: continue to monitor BP at rest.

## 2022-08-18 NOTE — Progress Notes (Signed)
Office: [phone]  /  Fax: [phone]   Initial Visit  Natalie Wu was seen in clinic today to evaluate for obesity. She is interested in losing weight to improve overall health and reduce the risk of weight related complications. She presents today to review program treatment options, initial physical assessment, and evaluation.     She was referred by: PCP  When asked what else they would like to accomplish? She states: Improve energy levels and physical activity, Improve quality of life, and Improve appearance  Weight history:  weight was ~180 lb pre- pregnancy.  She has a 7 mos of baby.  Initially saw weight loss post partum and has stalled.  She would like to be ~150 lb.  She was overweight in childhood.  When asked how has your weight affected you? She states: Having fatigue  Some associated conditions: Vitamin D Deficiency  Contributing factors: Family history, Nutritional, and Pregnancy  Weight promoting medications identified: Contraceptives or hormonal therapy  Current nutrition plan: None  Current level of physical activity: Walking  Current or previous pharmacotherapy: Phentermine  Response to medication: Lost weight initially but was unable to sustain weight loss   Past medical history includes:   Past Medical History:  Diagnosis Date   Complication of anesthesia    Slow to wake up   GERD (gastroesophageal reflux disease)    Iron deficiency anemia    Migraine      Objective:   BP (!) 139/95   Pulse 93   Temp 98 F (36.7 C)   Ht 5\' 4"  (1.626 m)   Wt 201 lb (91.2 kg)   SpO2 100%   BMI 34.50 kg/m  She was weighed on the bioimpedance scale: Body mass index is 34.5 kg/m.  Peak Weight:201 , Body Fat%:43.7, Visceral Fat Rating:9, Weight trend over the last 12 months: Decreasing  General:  Alert, oriented and cooperative. Patient is in no acute distress.  Respiratory: Normal respiratory effort, no problems with respiration noted   Gait: able to  ambulate independently  Mental Status: Normal mood and affect. Normal behavior. Normal judgment and thought content.   DIAGNOSTIC DATA REVIEWED:  BMET    Component Value Date/Time   NA 140 12/24/2021 0918   NA 137 12/08/2021 1130   K 4.0 12/24/2021 0918   CL 110 12/24/2021 0918   CO2 23 12/24/2021 0918   GLUCOSE 79 12/24/2021 0918   BUN 7 12/24/2021 0918   BUN 3 (L) 12/08/2021 1130   CREATININE 0.62 12/24/2021 0918   CALCIUM 8.3 (L) 12/24/2021 0918   GFRNONAA >60 12/24/2021 0918   Lab Results  Component Value Date   HGBA1C 5.3 06/17/2021   No results found for: "INSULIN" CBC    Component Value Date/Time   WBC 11.0 (H) 12/24/2021 0918   RBC 3.13 (L) 12/24/2021 0918   HGB 7.4 (L) 12/24/2021 0918   HGB 9.4 (L) 12/08/2021 1130   HCT 24.4 (L) 12/24/2021 0918   HCT 29.9 (L) 12/08/2021 1130   PLT 219 12/24/2021 0918   PLT 297 12/08/2021 1130   MCV 78.0 (L) 12/24/2021 0918   MCV 76 (L) 12/08/2021 1130   MCH 23.6 (L) 12/24/2021 0918   MCHC 30.3 12/24/2021 0918   RDW 17.0 (H) 12/24/2021 0918   RDW 15.0 12/08/2021 1130   Iron/TIBC/Ferritin/ %Sat    Component Value Date/Time   IRON 82 12/27/2020 1222   TIBC 583 (H) 12/27/2020 1222   FERRITIN 5 (L) 12/27/2020 1222   IRONPCTSAT 14 (L) 12/27/2020  1222   Lipid Panel     Component Value Date/Time   CHOL 227 (H) 04/19/2014 1238   TRIG 161.0 (H) 04/19/2014 1238   HDL 68.70 04/19/2014 1238   CHOLHDL 3 04/19/2014 1238   VLDL 32.2 04/19/2014 1238   LDLCALC 126 (H) 04/19/2014 1238   LDLDIRECT 120.5 08/10/2012 0903   Hepatic Function Panel     Component Value Date/Time   PROT 5.3 (L) 12/24/2021 0918   PROT 5.6 (L) 12/08/2021 1130   ALBUMIN 2.3 (L) 12/24/2021 0918   ALBUMIN 3.1 (L) 12/08/2021 1130   AST 19 12/24/2021 0918   ALT 16 12/24/2021 0918   ALKPHOS 115 12/24/2021 0918   BILITOT 0.2 (L) 12/24/2021 0918   BILITOT <0.2 12/08/2021 1130      Component Value Date/Time   TSH 2.52 10/08/2020 0905     Assessment  and Plan:   Vitamin D deficiency Assessment & Plan: Last vitamin D Lab Results  Component Value Date   VD25OH 26.77 (L) 10/08/2020   She has a hx of vitamin D def, currently not on any supplements.  C/o fatigue.  Plan: check Vitamin D level with next labs.  Aim for a goal of 50-70.   Generalized obesity  BMI 34.0-34.9,adult  Picky eater Assessment & Plan: Reviewed her picky eating habits with an overall lack of vegetables, little meats outside of chicken with texture issues.  Plan: Will review food plan next visit following IC test and make modifications as needed. Plan to introduce one new vegetable each week.   Elevated blood pressure reading in office without diagnosis of hypertension Assessment & Plan: BP elevated today. She was in a rush to get here. Denies CP or HA.  Monitors BP at work and has been running 120s/80s. Plan: continue to monitor BP at rest.         Obesity Treatment / Action Plan:  Patient will work on garnering support from family and friends to begin weight loss journey. Will work on eliminating or reducing the presence of highly palatable, calorie dense foods in the home. Will complete provided nutritional and psychosocial assessment questionnaire before the next appointment. Will be scheduled for indirect calorimetry to determine resting energy expenditure in a fasting state.  This will allow [place] to create a reduced calorie, high-protein meal plan to promote loss of fat mass while preserving muscle mass. Was counseled on nutritional approaches to weight loss and benefits of complex carbs and high quality protein as part of nutritional weight management. Was counseled on pharmacotherapy and role as an adjunct in weight management.   Obesity Education Performed Today:  She was weighed on the bioimpedance scale and results were discussed and documented in the synopsis.  We discussed obesity as a disease and the importance of a more detailed  evaluation of all the factors contributing to the disease.  We discussed the importance of long term lifestyle changes which include nutrition, exercise and behavioral modifications as well as the importance of customizing this to her specific health and social needs.  We discussed the benefits of reaching a healthier weight to alleviate the symptoms of existing conditions and reduce the risks of the biomechanical, metabolic and psychological effects of obesity.  Natalie Wu appears to be in the action stage of change and states they are ready to start intensive lifestyle modifications and behavioral modifications.  30 minutes was spent today on this visit including the above counseling, pre-visit chart review, and post-visit documentation.  Reviewed by clinician on day  of visit: allergies, medications, problem list, medical history, surgical history, family history, social history, and previous encounter notes pertinent to obesity diagnosis.    Loyal Gambler, D.O. DABFM, DABOM [facility] [phone] [place], [place] [address] [phone]

## 2022-08-18 NOTE — Assessment & Plan Note (Signed)
Reviewed her picky eating habits with an overall lack of vegetables, little meats outside of chicken with texture issues.  Plan: Will review food plan next visit following IC test and make modifications as needed. Plan to introduce one new vegetable each week.

## 2022-09-02 ENCOUNTER — Ambulatory Visit (INDEPENDENT_AMBULATORY_CARE_PROVIDER_SITE_OTHER): Payer: 59 | Admitting: Family Medicine

## 2022-09-03 ENCOUNTER — Ambulatory Visit (INDEPENDENT_AMBULATORY_CARE_PROVIDER_SITE_OTHER): Payer: 59 | Admitting: Family Medicine

## 2022-09-03 ENCOUNTER — Encounter (INDEPENDENT_AMBULATORY_CARE_PROVIDER_SITE_OTHER): Payer: Self-pay | Admitting: Family Medicine

## 2022-09-03 VITALS — BP 114/79 | HR 90 | Temp 97.8°F | Ht 64.0 in | Wt 202.0 lb

## 2022-09-03 DIAGNOSIS — R0602 Shortness of breath: Secondary | ICD-10-CM | POA: Diagnosis not present

## 2022-09-03 DIAGNOSIS — E669 Obesity, unspecified: Secondary | ICD-10-CM | POA: Diagnosis not present

## 2022-09-03 DIAGNOSIS — F3289 Other specified depressive episodes: Secondary | ICD-10-CM

## 2022-09-03 DIAGNOSIS — Z6834 Body mass index (BMI) 34.0-34.9, adult: Secondary | ICD-10-CM

## 2022-09-03 DIAGNOSIS — D509 Iron deficiency anemia, unspecified: Secondary | ICD-10-CM | POA: Diagnosis not present

## 2022-09-03 DIAGNOSIS — Z1331 Encounter for screening for depression: Secondary | ICD-10-CM | POA: Diagnosis not present

## 2022-09-03 DIAGNOSIS — R5383 Other fatigue: Secondary | ICD-10-CM | POA: Diagnosis not present

## 2022-09-03 NOTE — Progress Notes (Signed)
Chief Complaint:   OBESITY Natalie Wu (MR# [id number]) is a 32 y.o. female who presents for evaluation and treatment of obesity and related comorbidities. Current BMI is Body mass index is 34.67 kg/m. Natalie Wu has been struggling with her weight for many years and has been unsuccessful in either losing weight, maintaining weight loss, or reaching her healthy weight goal.  Natalie Wu had a baby in July 2023 and is on BCP's patient works as a Technical brewer.  Patient lives with her boyfriend and 25-month-old daughter.  Patient has been overweight since childhood, she is a picky eater and also a stress and boredom eater.  Natalie Wu is currently in the action stage of change and ready to dedicate time achieving and maintaining a healthier weight. Natalie Wu is interested in becoming our patient and working on intensive lifestyle modifications including (but not limited to) diet and exercise for weight loss.  Natalie Wu's habits were reviewed today and are as follows: Her family eats meals together, she thinks her family will eat healthier with her, her desired weight loss is 57 lbs, she has been heavy most of her life, she started gaining weight last year, her heaviest weight ever was 210 pounds, she is a picky eater and doesn't like to eat healthier foods, she has significant food cravings issues, she skips meals frequently, she is frequently drinking liquids with calories, she frequently makes poor food choices, she has problems with excessive hunger, she frequently eats larger portions than normal, and she struggles with emotional eating.  Depression Screen Natalie Wu's Food and Mood (modified PHQ-9) score was 17.  Subjective:   1. Other fatigue Natalie Wu admits to daytime somnolence and admits to waking up still tired. Patient has a history of symptoms of daytime fatigue and morning fatigue. Natalie Wu generally gets  5-7  hours of sleep per night, and states that she has nightime awakenings and generally restful sleep. Snoring is  present. Apneic episodes are not present. Epworth Sleepiness Score is 5.  EKG, NSR without ischemia.  2. SOBOE (shortness of breath on exertion) Natalie Wu notes increasing shortness of breath with exercising and seems to be worsening over time with weight gain. She notes getting out of breath sooner with activity than she used to. This has not gotten worse recently. Natalie Wu denies shortness of breath at rest or orthopnea.  3. Iron deficiency anemia, unspecified iron deficiency anemia type Patient has needed IV iron infusions.  No longer has a heavy menses.  Patient has PICA.  Positive family history of IDA.  4. Other depression with emotional eating Bariatric PHQ-9:17.  Stress levels fairly high.  Her husband is supportive.  Assessment/Plan:   1. Other fatigue Natalie Wu does feel that her weight is causing her energy to be lower than it should be. Fatigue may be related to obesity, depression or many other causes. Labs will be ordered, and in the meanwhile, Natalie Wu will focus on self care including making healthy food choices, increasing physical activity and focusing on stress reduction.  Update labs today.  - EKG 12-Lead - Ferritin - Iron and TIBC - Comprehensive metabolic panel - Lipid panel - Vitamin B12 - VITAMIN D 25 Hydroxy (Vit-D Deficiency, Fractures) - CBC - TSH Rfx on Abnormal to Free T4 - Hemoglobin A1c - Insulin, random  2. SOBOE (shortness of breath on exertion) Natalie Wu does feel that she gets out of breath more easily that she used to when she exercises. Natalie Wu's shortness of breath appears to be obesity related and exercise induced. She has agreed  to work on weight loss and gradually increase exercise to treat her exercise induced shortness of breath. Will continue to monitor closely.  3. Iron deficiency anemia, unspecified iron deficiency anemia type Check labs today.  - Ferritin - Iron and TIBC - CBC  4. Other depression with emotional eating Consider CBT and the use of  bupropion.  5. Depression screening Natalie Wu had a positive depression screening. Depression is commonly associated with obesity and often results in emotional eating behaviors. We will monitor this closely and work on CBT to help improve the non-hunger eating patterns. Referral to Psychology may be required if no improvement is seen as she continues in our clinic.  6. Generalized obesity  7. BMI 34.0-34.9,adult Natalie Wu is currently in the action stage of change and her goal is to continue with weight loss efforts. I recommend Natalie Wu begin the structured treatment plan as follows:  She has agreed to the Category 2 Plan +100 cal.  Patient can swap out a Premier protein shake for fruit for breakfast  Exercise goals: All adults should avoid inactivity. Some physical activity is better than none, and adults who participate in any amount of physical activity gain some health benefits.  Track steps.    Behavioral modification strategies: increasing lean protein intake, increasing vegetables, increasing water intake, decreasing eating out, no skipping meals, meal planning and cooking strategies, keeping healthy foods in the home, ways to avoid boredom eating, better snacking choices, avoiding temptations, and planning for success.  She was informed of the importance of frequent follow-up visits to maximize her success with intensive lifestyle modifications for her multiple health conditions. She was informed we would discuss her lab results at her next visit unless there is a critical issue that needs to be addressed sooner. Natalie Wu agreed to keep her next visit at the agreed upon time to discuss these results.  Objective:   Blood pressure 114/79, pulse 90, temperature 97.8 F (36.6 C), height 5\' 4"  (1.626 m), weight 202 lb (91.6 kg), SpO2 98 %, not currently breastfeeding. Body mass index is 34.67 kg/m.  EKG: Normal sinus rhythm, rate 90 bpm.  Indirect Calorimeter completed today shows a VO2 of 236 and a  REE of 1627.  Her calculated basal metabolic rate is [id number] thus her basal metabolic rate is better than expected.  General: Cooperative, alert, well developed, in no acute distress. HEENT: Conjunctivae and lids unremarkable. Cardiovascular: Regular rhythm.  Lungs: Normal work of breathing. Neurologic: No focal deficits.   Lab Results  Component Value Date   CREATININE 0.62 12/24/2021   BUN 7 12/24/2021   NA 140 12/24/2021   K 4.0 12/24/2021   CL 110 12/24/2021   CO2 23 12/24/2021   Lab Results  Component Value Date   ALT 16 12/24/2021   AST 19 12/24/2021   ALKPHOS 115 12/24/2021   BILITOT 0.2 (L) 12/24/2021   Lab Results  Component Value Date   HGBA1C 5.3 06/17/2021   No results found for: "INSULIN" Lab Results  Component Value Date   TSH 2.52 10/08/2020   Lab Results  Component Value Date   CHOL 227 (H) 04/19/2014   HDL 68.70 04/19/2014   LDLCALC 126 (H) 04/19/2014   LDLDIRECT 120.5 08/10/2012   TRIG 161.0 (H) 04/19/2014   CHOLHDL 3 04/19/2014   Lab Results  Component Value Date   WBC 11.0 (H) 12/24/2021   HGB 7.4 (L) 12/24/2021   HCT 24.4 (L) 12/24/2021   MCV 78.0 (L) 12/24/2021   PLT 219 12/24/2021  Lab Results  Component Value Date   IRON 82 12/27/2020   TIBC 583 (H) 12/27/2020   FERRITIN 5 (L) 12/27/2020   Attestation Statements:   Reviewed by clinician on day of visit: allergies, medications, problem list, medical history, surgical history, family history, social history, and previous encounter notes.  Time spent on visit including pre-visit chart review and post-visit charting and care was 40 minutes.   I, Malcolm Metro, am acting as Energy manager for Seymour Bars, DO.  I have reviewed the above documentation for accuracy and completeness, and I agree with the above. Seymour Bars DO

## 2022-09-04 LAB — IRON AND TIBC
Iron Saturation: 12 % — ABNORMAL LOW (ref 15–55)
Iron: 60 ug/dL (ref 27–159)
Total Iron Binding Capacity: 517 ug/dL — ABNORMAL HIGH (ref 250–450)
UIBC: 457 ug/dL — ABNORMAL HIGH (ref 131–425)

## 2022-09-04 LAB — FERRITIN: Ferritin: 15 ng/mL (ref 15–150)

## 2022-09-04 LAB — LIPID PANEL
Chol/HDL Ratio: 3.7 ratio (ref 0.0–4.4)
Cholesterol, Total: 211 mg/dL — ABNORMAL HIGH (ref 100–199)
HDL: 57 mg/dL (ref >39)
LDL Chol Calc (NIH): 122 mg/dL — ABNORMAL HIGH (ref 0–99)
Triglycerides: 181 mg/dL — ABNORMAL HIGH (ref 0–149)
VLDL Cholesterol Cal: 32 mg/dL (ref 5–40)

## 2022-09-04 LAB — COMPREHENSIVE METABOLIC PANEL
ALT: 18 IU/L (ref 0–32)
AST: 16 IU/L (ref 0–40)
Albumin/Globulin Ratio: 1.5 (ref 1.2–2.2)
Albumin: 4 g/dL (ref 4.0–5.0)
Alkaline Phosphatase: 117 IU/L (ref 44–121)
BUN/Creatinine Ratio: 9 (ref 9–23)
BUN: 6 mg/dL (ref 6–20)
Bilirubin Total: 0.3 mg/dL (ref 0.0–1.2)
CO2: 22 mmol/L (ref 20–29)
Calcium: 9 mg/dL (ref 8.7–10.2)
Chloride: 102 mmol/L (ref 96–106)
Creatinine, Ser: 0.7 mg/dL (ref 0.57–1.00)
Globulin, Total: 2.7 g/dL (ref 1.5–4.5)
Glucose: 72 mg/dL (ref 70–99)
Potassium: 4.3 mmol/L (ref 3.5–5.2)
Sodium: 138 mmol/L (ref 134–144)
Total Protein: 6.7 g/dL (ref 6.0–8.5)
eGFR: 119 mL/min/{1.73_m2} (ref >59)

## 2022-09-04 LAB — TSH RFX ON ABNORMAL TO FREE T4: TSH: 2.03 u[IU]/mL (ref 0.450–4.500)

## 2022-09-04 LAB — VITAMIN B12: Vitamin B-12: 270 pg/mL (ref 232–1245)

## 2022-09-04 LAB — HEMOGLOBIN A1C
Est. average glucose Bld gHb Est-mCnc: 114 mg/dL
Hgb A1c MFr Bld: 5.6 % (ref 4.8–5.6)

## 2022-09-04 LAB — CBC
Hematocrit: 40.5 % (ref 34.0–46.6)
Hemoglobin: 13.2 g/dL (ref 11.1–15.9)
MCH: 27.4 pg (ref 26.6–33.0)
MCHC: 32.6 g/dL (ref 31.5–35.7)
MCV: 84 fL (ref 79–97)
Platelets: 375 10*3/uL (ref 150–450)
RBC: 4.82 x10E6/uL (ref 3.77–5.28)
RDW: 14.5 % (ref 11.7–15.4)
WBC: 11.1 10*3/uL — ABNORMAL HIGH (ref 3.4–10.8)

## 2022-09-04 LAB — INSULIN, RANDOM: INSULIN: 21 u[IU]/mL (ref 2.6–24.9)

## 2022-09-04 LAB — VITAMIN D 25 HYDROXY (VIT D DEFICIENCY, FRACTURES): Vit D, 25-Hydroxy: 26.7 ng/mL — ABNORMAL LOW (ref 30.0–100.0)

## 2022-09-16 ENCOUNTER — Ambulatory Visit (INDEPENDENT_AMBULATORY_CARE_PROVIDER_SITE_OTHER): Payer: 59 | Admitting: Family Medicine

## 2022-09-17 ENCOUNTER — Ambulatory Visit (INDEPENDENT_AMBULATORY_CARE_PROVIDER_SITE_OTHER): Payer: 59 | Admitting: Family Medicine

## 2022-09-21 ENCOUNTER — Encounter: Payer: Self-pay | Admitting: Family

## 2022-09-23 ENCOUNTER — Encounter (INDEPENDENT_AMBULATORY_CARE_PROVIDER_SITE_OTHER): Payer: Self-pay | Admitting: Family Medicine

## 2022-09-23 ENCOUNTER — Ambulatory Visit (INDEPENDENT_AMBULATORY_CARE_PROVIDER_SITE_OTHER): Payer: 59 | Admitting: Family Medicine

## 2022-09-23 VITALS — BP 141/92 | HR 75 | Temp 98.1°F | Ht 64.0 in | Wt 200.0 lb

## 2022-09-23 DIAGNOSIS — E88819 Insulin resistance, unspecified: Secondary | ICD-10-CM

## 2022-09-23 DIAGNOSIS — E782 Mixed hyperlipidemia: Secondary | ICD-10-CM | POA: Diagnosis not present

## 2022-09-23 DIAGNOSIS — E559 Vitamin D deficiency, unspecified: Secondary | ICD-10-CM

## 2022-09-23 DIAGNOSIS — R7989 Other specified abnormal findings of blood chemistry: Secondary | ICD-10-CM

## 2022-09-23 DIAGNOSIS — E669 Obesity, unspecified: Secondary | ICD-10-CM | POA: Diagnosis not present

## 2022-09-23 DIAGNOSIS — Z6834 Body mass index (BMI) 34.0-34.9, adult: Secondary | ICD-10-CM | POA: Diagnosis not present

## 2022-09-23 DIAGNOSIS — D509 Iron deficiency anemia, unspecified: Secondary | ICD-10-CM

## 2022-09-23 DIAGNOSIS — E538 Deficiency of other specified B group vitamins: Secondary | ICD-10-CM

## 2022-09-23 MED ORDER — CYANOCOBALAMIN 500 MCG PO TABS: 1000.0000 ug | ORAL_TABLET | Freq: Every day | ORAL | 3 refills | Status: DC

## 2022-09-23 MED ORDER — VITAMIN D (ERGOCALCIFEROL) 1.25 MG (50000 UNIT) PO CAPS: 50000.0000 [IU] | ORAL_CAPSULE | ORAL | 0 refills | Status: DC

## 2022-09-23 MED ORDER — METFORMIN HCL ER 500 MG PO TB24
500.0000 mg | ORAL_TABLET | Freq: Every day | ORAL | 0 refills | Status: DC
Start: 2022-09-23 — End: 2022-10-08

## 2022-09-23 MED ORDER — FERROUS GLUCONATE 324 (38 FE) MG PO TABS: 324.0000 mg | ORAL_TABLET | Freq: Every day | ORAL | 3 refills | Status: DC

## 2022-09-23 NOTE — Assessment & Plan Note (Signed)
Reviewed labs with patient Fasting insulin elevated at 21 with a HOMA- IR score of 3.7 (elevated)  She has cut back on her intake of starches and sweets Plan to ramp up exercise as energy level improves  Begin metformin XR 500 mg once daily w/ dinner Reviewed potential adverse side effects Recheck CMP, fasting insulin, B12 in 2 mos

## 2022-09-23 NOTE — Assessment & Plan Note (Signed)
Reviewed bioimpedence showing lean muscle gain and body fat loss She is motivated to continue working on her prescribed meal plan with plans to add in more consistent exercise. High stress has been a barrier

## 2022-09-23 NOTE — Assessment & Plan Note (Signed)
Last vitamin D Lab Results  Component Value Date   VD25OH 26.7 (L) 09/03/2022   Reviewed lab from last visit.  She c/o fatigue and is currently not on a vitamin D supplement.  We discussed how low vitamin D can contribute to poor immune function, fatigue, leptin resistance and loss of bone mass.  Goal vitamin D is 50-70.   Begin RX vitamin D 50,000 IU weekly. Recheck level in 3 mos

## 2022-09-23 NOTE — Progress Notes (Signed)
Office: [phone]  /  Fax: [phone]  WEIGHT SUMMARY AND BIOMETRICS  Starting Date: 09/03/22  Starting Weight: 202lb   Weight Lost Since Last Visit: 2lb   Vitals Temp: 98.1 F (36.7 C) BP: (!) 141/92 Pulse Rate: 75 SpO2: 100 %   Body Composition  Body Fat %: 42.9 % Fat Mass (lbs): 86.2 lbs Muscle Mass (lbs): 108.8 lbs Total Body Water (lbs): 76.8 lbs Visceral Fat Rating : 9   HPI  Chief Complaint: OBESITY  Natalie Wu is here to discuss her progress with her obesity treatment plan. She is on the the Category 2 Plan and states she is following her eating plan approximately 50 % of the time. She states she is exercising 45 minutes 7 times per week.   Interval History:  Since last office visit she is down 2 lb She is up 0.8 lb of muscle and lost 2.6 lb of body fat Stress levels have been high with her baby who is 63 mos old She is very mindful of food choices and portion sizes  She is eating most of the foods on her meal plan She denies hunger and has occasional afternoon cravings She is walking on a regular basis but has been tired   Pharmacotherapy: none  PHYSICAL EXAM:  Blood pressure (!) 141/92, pulse 75, temperature 98.1 F (36.7 C), height  (1.626 m), weight 200 lb (90.7 kg), SpO2 100 %, not currently breastfeeding. Body mass index is 34.33 kg/m.  General: She is overweight, cooperative, alert, well developed, and in no acute distress. PSYCH: Has normal mood, affect and thought process.   Lungs: Normal breathing effort, no conversational dyspnea.  ASSESSMENT AND PLAN  TREATMENT PLAN FOR OBESITY:  Recommended Dietary Goals  Natalie Wu is currently in the action stage of change. As such, her goal is to continue weight management plan. She has agreed to the Category 2 Plan.  Behavioral Intervention  We discussed the following Behavioral Modification Strategies today: increasing lean protein intake, decreasing simple carbohydrates , increasing  vegetables, increasing lower glycemic fruits, increasing fiber rich foods, avoiding skipping meals, increasing water intake, work on meal planning and preparation, work on managing stress, creating time for self-care and relaxation measures, continue to practice mindfulness when eating, and planning for success.  Additional resources provided today: NA  Recommended Physical Activity Goals  Natalie Wu has been advised to work up to 150 minutes of moderate intensity aerobic activity a week and strengthening exercises 2-3 times per week for cardiovascular health, weight loss maintenance and preservation of muscle mass.   She has agreed to Work on scheduling and tracking physical activity.   Pharmacotherapy changes for the treatment of obesity: metformin 500 mg once daily  ASSOCIATED CONDITIONS ADDRESSED TODAY  Insulin resistance Assessment & Plan: Reviewed labs with patient Fasting insulin elevated at 21 with a HOMA- IR score of 3.7 (elevated)  She has cut back on her intake of starches and sweets Plan to ramp up exercise as energy level improves  Begin metformin XR 500 mg once daily w/ dinner Reviewed potential adverse side effects Recheck CMP, fasting insulin, B12 in 2 mos  Orders: -     metFORMIN HCl ER; Take 1 tablet (500 mg total) by mouth daily with supper.  Dispense: 30 tablet; Refill: 0  Generalized obesity Assessment & Plan: Reviewed bioimpedence showing lean muscle gain and body fat loss She is motivated to continue working on her prescribed meal plan with plans to add in more consistent exercise. High stress has  been a barrier     BMI 34.0-34.9,adult  Vitamin D deficiency Assessment & Plan: Last vitamin D Lab Results  Component Value Date   VD25OH 26.7 (L) 09/03/2022   Reviewed lab from last visit.  She c/o fatigue and is currently not on a vitamin D supplement.  We discussed how low vitamin D can contribute to poor immune function, fatigue, leptin resistance and  loss of bone mass.  Goal vitamin D is 50-70.   Begin RX vitamin D 50,000 IU weekly. Recheck level in 3 mos   Iron deficiency anemia, unspecified iron deficiency anemia type Assessment & Plan: Reviewed lab from last visit. Iron level is LOW and she c/o fatigue She has a hx of IDA and a fam hx of IDA She had heavy menses prior to use of OCPs which are now light She denies melena or hematochezia She had constipation on ferrous sulfate in the past and her last IV iron was post partum  Begin Ferrous Gluconate RX 324 mg daily. Recheck iron levels in 3 mos   Low vitamin B12 level  B12 deficiency Assessment & Plan: Reviewed lab from last visit B12 level : 270, up from 195 previously She has not been taking a B12 supplement. She c/o fatigue but denies paresthesias or brain fog.  Begin RX Vitamin B12 1,000 mcg daily Consider B12 injections if levels are not improving in the next 2-3 mos    Mixed hyperlipidemia Assessment & Plan: Reviewed lab from last visit.  She has never taken lipid lowering medications.  She lacks other cardiovascular risk factors.  She is of child - bearing age (on OCPs).  She has recently started her prescribed meal plan which is low in saturated fats.  Lab Results  Component Value Date   CHOL 211 (H) 09/03/2022   HDL 57 09/03/2022   LDLCALC 122 (H) 09/03/2022   LDLDIRECT 120.5 08/10/2012   TRIG 181 (H) 09/03/2022   CHOLHDL 3.7 09/03/2022   Will plan to repeat FLP in 4-6 mos   Other orders -     Vitamin D (Ergocalciferol); Take 1 capsule (50,000 Units total) by mouth every 7 (seven) days.  Dispense: 12 capsule; Refill: 0 -     Cyanocobalamin; Take 2 tablets (1,000 mcg total) by mouth daily.  Dispense: 30 tablet; Refill: 3 -     Ferrous Gluconate; Take 1 tablet (324 mg total) by mouth daily with breakfast.  Dispense: 30 tablet; Refill: 3      She was informed of the importance of frequent follow up visits to maximize her success with intensive  lifestyle modifications for her multiple health conditions.   ATTESTASTION STATEMENTS:  Reviewed by clinician on day of visit: allergies, medications, problem list, medical history, surgical history, family history, social history, and previous encounter notes pertinent to obesity diagnosis.   I have personally spent 30 minutes total time today in preparation, patient care, nutritional counseling and documentation for this visit, including the following: review of clinical lab tests; review of medical tests/procedures/services.      Glennis Brink, DO DABFM, DABOM [facility] [address] [place], [place] [address] [phone]

## 2022-09-23 NOTE — Assessment & Plan Note (Signed)
Reviewed lab from last visit. Iron level is LOW and she c/o fatigue She has a hx of IDA and a fam hx of IDA She had heavy menses prior to use of OCPs which are now light She denies melena or hematochezia She had constipation on ferrous sulfate in the past and her last IV iron was post partum  Begin Ferrous Gluconate RX 324 mg daily. Recheck iron levels in 3 mos

## 2022-09-23 NOTE — Assessment & Plan Note (Signed)
Reviewed lab from last visit B12 level : 270, up from 195 previously She has not been taking a B12 supplement. She c/o fatigue but denies paresthesias or brain fog.  Begin RX Vitamin B12 1,000 mcg daily Consider B12 injections if levels are not improving in the next 2-3 mos

## 2022-09-23 NOTE — Assessment & Plan Note (Signed)
Reviewed lab from last visit.  She has never taken lipid lowering medications.  She lacks other cardiovascular risk factors.  She is of child - bearing age (on OCPs).  She has recently started her prescribed meal plan which is low in saturated fats.  Lab Results  Component Value Date   CHOL 211 (H) 09/03/2022   HDL 57 09/03/2022   LDLCALC 122 (H) 09/03/2022   LDLDIRECT 120.5 08/10/2012   TRIG 181 (H) 09/03/2022   CHOLHDL 3.7 09/03/2022   Will plan to repeat FLP in 4-6 mos

## 2022-09-25 ENCOUNTER — Ambulatory Visit (INDEPENDENT_AMBULATORY_CARE_PROVIDER_SITE_OTHER): Payer: BLUE CROSS/BLUE SHIELD | Admitting: Nurse Practitioner

## 2022-09-25 ENCOUNTER — Encounter: Payer: Self-pay | Admitting: Nurse Practitioner

## 2022-09-25 VITALS — BP 119/74 | HR 94 | Temp 98.0°F | Resp 16 | Ht 63.0 in | Wt 167.4 lb

## 2022-09-25 DIAGNOSIS — R635 Abnormal weight gain: Secondary | ICD-10-CM

## 2022-09-25 DIAGNOSIS — E782 Mixed hyperlipidemia: Secondary | ICD-10-CM

## 2022-09-25 DIAGNOSIS — E559 Vitamin D deficiency, unspecified: Secondary | ICD-10-CM

## 2022-09-25 DIAGNOSIS — Z6829 Body mass index (BMI) 29.0-29.9, adult: Secondary | ICD-10-CM

## 2022-09-25 DIAGNOSIS — D509 Iron deficiency anemia, unspecified: Secondary | ICD-10-CM | POA: Diagnosis not present

## 2022-09-25 DIAGNOSIS — E663 Overweight: Secondary | ICD-10-CM

## 2022-09-25 DIAGNOSIS — E538 Deficiency of other specified B group vitamins: Secondary | ICD-10-CM | POA: Diagnosis not present

## 2022-09-25 MED ORDER — PHENTERMINE HCL 37.5 MG PO TABS: 37.5000 mg | ORAL_TABLET | Freq: Every day | ORAL | 0 refills | Status: DC

## 2022-09-25 NOTE — Progress Notes (Signed)
[facility] [address] [place], [place] [address]  Internal MEDICINE  Office Visit Note  Patient Name: [name]  [id number]  [id number]  Date of Service: 09/25/2022   Complaints/HPI Pt is here for establishment of PCP. Chief Complaint  Patient presents with   New Patient (Initial Visit)    Weight loss     HPI Aquila presents for a new patient visit to establish care.  Well-appearing 32 y.o. with history of anemia and depression Work: works as [employer]  Home: lives at with son, boyfriend Diet: so-so, doesn't eat breakfast, sometimes no lunch. Meat and veggies.  Exercise: walking Tobacco use: none  Alcohol use: occasional  Illicit drug use: none Pap smear: due soon  Labs: due for routine labs  New or worsening pain: none  Interested in weight loss, has been increasing exercise and working on eating healthier foods but has not lost any significant weight. Interested in trying weight loss medication to aid along with diet and exercise.  Concerned about iron levels, wants to have them checked.    Current Medication: Outpatient Encounter Medications as of 09/25/2022  Medication Sig   phentermine (ADIPEX-P) 37.5 MG tablet Take 1 tablet (37.5 mg total) by mouth daily before breakfast. Start with 1/2 tablet daily for at least the first week.   BLISOVI 24 FE 1-20 MG-MCG(24) tablet Take 1 tablet by mouth daily.   buPROPion (WELLBUTRIN XL) 150 MG 24 hr tablet TAKE 1 TABLET BY MOUTH EVERY DAY   cyclobenzaprine (FLEXERIL) 10 MG tablet Take 0.5-1 tablets (5-10 mg total) by mouth 3 (three) times daily as needed for muscle spasms.   ketorolac (TORADOL) 10 MG tablet Take 1 tablet (10 mg total) by mouth every 8 (eight) hours.   ondansetron (ZOFRAN ODT) 4 MG disintegrating tablet Take 1 tablet (4 mg total) by mouth every 8 (eight) hours as needed.   QUEtiapine (SEROQUEL XR) 50 MG TB24 24 hr tablet TAKE 1 TABLET BY MOUTH EVERYDAY AT BEDTIME   No facility-administered encounter  medications on file as of 09/25/2022.    Surgical History: Past Surgical History:  Procedure Laterality Date   TONSILLECTOMY     age 38    Medical History: Past Medical History:  Diagnosis Date   Anemia    Skin cancer     Family History: History reviewed. No pertinent family history.  Social History   Socioeconomic History   Marital status: Single    Spouse name: Not on file   Number of children: Not on file   Years of education: Not on file   Highest education level: Not on file  Occupational History   Not on file  Tobacco Use   Smoking status: Never   Smokeless tobacco: Never  Vaping Use   Vaping Use: Never used  Substance and Sexual Activity   Alcohol use: Yes    Comment: social   Drug use: No   Sexual activity: Yes  Other Topics Concern   Not on file  Social History Narrative   [employer]   25 year old son, Gerre Pebbles    Social Determinants of Health   Financial Resource Strain: Not on file  Food Insecurity: Not on file  Transportation Needs: Not on file  Physical Activity: Not on file  Stress: Not on file  Social Connections: Not on file  Intimate Partner Violence: Not on file     Review of Systems  Constitutional:  Positive for activity change (walking more), appetite change and fatigue. Negative for  chills and unexpected weight change (abnormal weight gain with difficulty losing weight while trying).  HENT:  Positive for postnasal drip. Negative for congestion, rhinorrhea, sinus pressure, sinus pain, sneezing and sore throat.   Respiratory: Negative.  Negative for cough, chest tightness, shortness of breath and wheezing.   Cardiovascular: Negative.  Negative for chest pain and palpitations.  Gastrointestinal: Negative.  Negative for abdominal pain, constipation, diarrhea, nausea and vomiting.  Musculoskeletal: Negative.  Negative for arthralgias, back pain, joint swelling and neck pain.  Skin:  Negative for rash.  Neurological: Negative.    Psychiatric/Behavioral: Negative.  Negative for behavioral problems (Depression), sleep disturbance and suicidal ideas. The patient is not nervous/anxious.     Vital Signs: BP 119/74   Pulse 94   Temp 98 F (36.7 C)   Resp 16   Ht 5\' 3"  (1.6 m)   Wt 167 lb 6.4 oz (75.9 kg)   SpO2 99%   BMI 29.65 kg/m    Physical Exam Vitals reviewed.  Constitutional:      General: She is not in acute distress.    Appearance: Normal appearance. She is obese. She is not ill-appearing.  HENT:     Head: Normocephalic and atraumatic.  Eyes:     Pupils: Pupils are equal, round, and reactive to light.  Cardiovascular:     Rate and Rhythm: Normal rate and regular rhythm.  Pulmonary:     Effort: Pulmonary effort is normal. No respiratory distress.  Neurological:     Mental Status: She is alert and oriented to person, place, and time.  Psychiatric:        Mood and Affect: Mood normal.        Behavior: Behavior normal.       Assessment/Plan: 1. Abnormal weight gain Routine labs ordered BMR calculated at 1448 calories per day, recommend calorie restriction to 1500 calories per day for now.  - Iron, TIBC and Ferritin Panel - CBC with Differential/Platelet - CMP14+EGFR - Lipid Profile - Vitamin D (25 hydroxy) - B12 and Folate Panel - TSH + free T4 - phentermine (ADIPEX-P) 37.5 MG tablet; Take 1 tablet (37.5 mg total) by mouth daily before breakfast. Start with 1/2 tablet daily for at least the first week.  Dispense: 30 tablet; Refill: 0  2. Iron deficiency anemia, unspecified iron deficiency anemia type Routine labs ordered Prior history of anemia - Iron, TIBC and Ferritin Panel - CBC with Differential/Platelet - CMP14+EGFR - Lipid Profile - Vitamin D (25 hydroxy) - B12 and Folate Panel - TSH + free T4  3. Mixed hyperlipidemia Routine labs ordered - CMP14+EGFR - Lipid Profile - TSH + free T4  4. B12 deficiency Routine labs ordered - Iron, TIBC and Ferritin Panel - CBC with  Differential/Platelet - B12 and Folate Panel - TSH + free T4  5. Vitamin D deficiency Routine lab ordered - Vitamin D (25 hydroxy)  6. Overweight with body mass index (BMI) of 29 to 29.9 in adult Obesity Counseling: Risk Assessment: An assessment of behavioral risk factors was made today and includes lack of exercise sedentary lifestyle, lack of portion control and poor dietary habits. The patient has been screened for diabetes and basal metabolic rate was calculated.   Risk Modification Advice: The patient was counseled on portion control guidelines. Safe recommendations on caloric restriction discussed with patient. Sample meals plans were provided. General guidelines on diet and lifestyle modifications were discussed at length. Introducing physical activity as tolerated and at the patient's ability level is recommended.  General Counseling: Siren verbalizes understanding of the findings of todays visit and agrees with plan of treatment. I have discussed any further diagnostic evaluation that may be needed or ordered today. We also reviewed her medications today. she has been encouraged to call the office with any questions or concerns that should arise related to todays visit.    Orders Placed This Encounter  Procedures   Iron, TIBC and Ferritin Panel   CBC with Differential/Platelet   CMP14+EGFR   Lipid Profile   Vitamin D (25 hydroxy)   B12 and Folate Panel   TSH + free T4    Meds ordered this encounter  Medications   phentermine (ADIPEX-P) 37.5 MG tablet    Sig: Take 1 tablet (37.5 mg total) by mouth daily before breakfast. Start with 1/2 tablet daily for at least the first week.    Dispense:  30 tablet    Refill:  0    Return in about 4 weeks (around 10/23/2022) for F/U, Weight loss, Labs, Izael Bessinger PCP.  Time spent:30 Minutes Time spent with patient included reviewing progress notes, labs, imaging studies, and discussing plan for follow up.   [place] Controlled  Substance Database was reviewed by me for overdose risk score (ORS)   This patient was seen by Sallyanne Kuster, FNP-C in collaboration with Dr. Beverely Risen as a part of collaborative care agreement.   Thailyn Khalid R. Tedd Sias, MSN, FNP-C Internal Medicine

## 2022-09-26 ENCOUNTER — Encounter: Payer: Self-pay | Admitting: Nurse Practitioner

## 2022-10-08 ENCOUNTER — Encounter (INDEPENDENT_AMBULATORY_CARE_PROVIDER_SITE_OTHER): Payer: Self-pay | Admitting: Family Medicine

## 2022-10-08 ENCOUNTER — Ambulatory Visit (INDEPENDENT_AMBULATORY_CARE_PROVIDER_SITE_OTHER): Payer: 59 | Admitting: Family Medicine

## 2022-10-08 VITALS — BP 127/89 | HR 88 | Temp 98.0°F | Ht 64.0 in | Wt 197.0 lb

## 2022-10-08 DIAGNOSIS — Z6833 Body mass index (BMI) 33.0-33.9, adult: Secondary | ICD-10-CM | POA: Diagnosis not present

## 2022-10-08 DIAGNOSIS — D509 Iron deficiency anemia, unspecified: Secondary | ICD-10-CM | POA: Diagnosis not present

## 2022-10-08 DIAGNOSIS — R7989 Other specified abnormal findings of blood chemistry: Secondary | ICD-10-CM

## 2022-10-08 DIAGNOSIS — E88819 Insulin resistance, unspecified: Secondary | ICD-10-CM

## 2022-10-08 DIAGNOSIS — E559 Vitamin D deficiency, unspecified: Secondary | ICD-10-CM | POA: Diagnosis not present

## 2022-10-08 DIAGNOSIS — E538 Deficiency of other specified B group vitamins: Secondary | ICD-10-CM

## 2022-10-08 DIAGNOSIS — E669 Obesity, unspecified: Secondary | ICD-10-CM

## 2022-10-08 MED ORDER — VITAMIN D (ERGOCALCIFEROL) 1.25 MG (50000 UNIT) PO CAPS
50000.0000 [IU] | ORAL_CAPSULE | ORAL | 0 refills | Status: DC
Start: 2022-10-08 — End: 2023-07-24

## 2022-10-08 MED ORDER — METFORMIN HCL ER 500 MG PO TB24
500.0000 mg | ORAL_TABLET | Freq: Every day | ORAL | 0 refills | Status: DC
Start: 2022-10-08 — End: 2022-11-02

## 2022-10-08 MED ORDER — CYANOCOBALAMIN 500 MCG PO TABS
1000.0000 ug | ORAL_TABLET | Freq: Every day | ORAL | 3 refills | Status: DC
Start: 2022-10-08 — End: 2023-02-18

## 2022-10-08 MED ORDER — FERROUS GLUCONATE 324 (38 FE) MG PO TABS
324.0000 mg | ORAL_TABLET | Freq: Every day | ORAL | 3 refills | Status: DC
Start: 2022-10-08 — End: 2023-03-30

## 2022-10-08 NOTE — Progress Notes (Signed)
Office: [phone]  /  Fax: [phone]  WEIGHT SUMMARY AND BIOMETRICS  Starting Date: 09/03/22  Starting Weight: 202lb   Weight Lost Since Last Visit: 3lb   Vitals Temp: 98 F (36.7 C) BP: 127/89 Pulse Rate: 88 SpO2: 100 %   Body Composition  Body Fat %: 41.9 % Fat Mass (lbs): 82.6 lbs Muscle Mass (lbs): 108.6 lbs Total Body Water (lbs): 74.8 lbs Visceral Fat Rating : 8     HPI  Chief Complaint: OBESITY  Natalie Wu is here to discuss her progress with her obesity treatment plan. She is on the the Category 2 Plan and states she is following her eating plan approximately 90 % of the time. She states she is exercising 45 minutes 7 times per week.   Interval History:  Since last office visit she is down 3 lb  She is down 5 lb in the past month She is doing well on metformin added for IR She is not eating all of the food on her plan She is extremely picky about veggies She is having a protein shake early AM and a greek yogurt mid AM, sometimes with a banana She brings [place] and cheese roll ups with fruit for lunch and then eats a 'regular' dinner with her partner - a starch/ meat and limited veggies She has cut back on night time snacks but is doing some boredom snacking at work She did use Phentermine in the past Her daughter has been sick and she's done some stress eating  Pharmacotherapy: metformin  PHYSICAL EXAM:  Blood pressure 127/89, pulse 88, temperature 98 F (36.7 C), height 5\' 4"  (1.626 m), weight 197 lb (89.4 kg), SpO2 100 %, not currently breastfeeding. Body mass index is 33.81 kg/m.  General: She is overweight, cooperative, alert, well developed, and in no acute distress. PSYCH: Has normal mood, affect and thought process.   Lungs: Normal breathing effort, no conversational dyspnea.   ASSESSMENT AND PLAN  TREATMENT PLAN FOR OBESITY:  Recommended Dietary Goals  Arcadia is currently in the action stage of change. As such, her goal is to  continue weight management plan. She has agreed to following a lower carbohydrate, vegetable and lean protein rich diet plan.  Behavioral Intervention  We discussed the following Behavioral Modification Strategies today: increasing lean protein intake, decreasing simple carbohydrates , increasing vegetables, increasing lower glycemic fruits, increasing fiber rich foods, increasing water intake, avoiding temptations and identifying enticing environmental cues, continue to practice mindfulness when eating, and planning for success.  - given her lack of veggies, advised to blend spinach with greek yogurt and frozen fruit  Additional resources provided today: NA  Recommended Physical Activity Goals  Tiesha has been advised to work up to 150 minutes of moderate intensity aerobic activity a week and strengthening exercises 2-3 times per week for cardiovascular health, weight loss maintenance and preservation of muscle mass.   She has agreed to Work on scheduling and tracking physical activity.   Pharmacotherapy changes for the treatment of obesity: none  ASSOCIATED CONDITIONS ADDRESSED TODAY  Insulin resistance Assessment & Plan: Fasting insulin 21 with a Homa IR score of 3.7 Doing well with new start Metformin XR 500 mg once daily with food Having some loose stools Doing better with reducing intake of starches and sweets Consistent with walking 45 min most days of the week  Continue current lifestyle changes Repeat BMP today Continue metformin XR 500 mg daily with dinner  Orders: -     Basic metabolic panel -  metFORMIN HCl ER; Take 1 tablet (500 mg total) by mouth daily with supper.  Dispense: 30 tablet; Refill: 0  Vitamin D deficiency Assessment & Plan: Last vitamin D Lab Results  Component Value Date   VD25OH 26.7 (L) 09/03/2022   She is doing well on RX vitamin D 50,000 IU weekly Denies adverse SE  Recheck vitamin D level in Aug  Orders: -     Vitamin D  (Ergocalciferol); Take 1 capsule (50,000 Units total) by mouth every 7 (seven) days.  Dispense: 12 capsule; Refill: 0  Low vitamin B12 level -     Cyanocobalamin; Take 2 tablets (1,000 mcg total) by mouth daily.  Dispense: 30 tablet; Refill: 3  Iron deficiency anemia, unspecified iron deficiency anemia type Assessment & Plan: Doing well on ferrous gluconate 324 mg daily She has previously needed IV iron infusions Denies GI upset or constipation She is 9 mos post partum  Repeat iron levels in Aug  Orders: -     Ferrous Gluconate; Take 1 tablet (324 mg total) by mouth daily with breakfast.  Dispense: 30 tablet; Refill: 3  B12 deficiency Assessment & Plan: Doing well on vitamin B12 1,000 mcg daily Energy level unchanged Denies paresthesias  Continue vitamin B12 1,000 mcg daily Recheck level in August       She was informed of the importance of frequent follow up visits to maximize her success with intensive lifestyle modifications for her multiple health conditions.   ATTESTASTION STATEMENTS:  Reviewed by clinician on day of visit: allergies, medications, problem list, medical history, surgical history, family history, social history, and previous encounter notes pertinent to obesity diagnosis.   I have personally spent 30 minutes total time today in preparation, patient care, nutritional counseling and documentation for this visit, including the following: review of clinical lab tests; review of medical tests/procedures/services.      Glennis Brink, DO DABFM, DABOM [facility] [address] [place], [place] [address] [phone]

## 2022-10-08 NOTE — Assessment & Plan Note (Signed)
Doing well on ferrous gluconate 324 mg daily She has previously needed IV iron infusions Denies GI upset or constipation She is 9 mos post partum  Repeat iron levels in Aug

## 2022-10-08 NOTE — Assessment & Plan Note (Signed)
Last vitamin D Lab Results  Component Value Date   VD25OH 26.7 (L) 09/03/2022   She is doing well on RX vitamin D 50,000 IU weekly Denies adverse SE  Recheck vitamin D level in Aug

## 2022-10-08 NOTE — Assessment & Plan Note (Signed)
Doing well on vitamin B12 1,000 mcg daily Energy level unchanged Denies paresthesias  Continue vitamin B12 1,000 mcg daily Recheck level in August

## 2022-10-08 NOTE — Assessment & Plan Note (Signed)
Fasting insulin 21 with a Homa IR score of 3.7 Doing well with new start Metformin XR 500 mg once daily with food Having some loose stools Doing better with reducing intake of starches and sweets Consistent with walking 45 min most days of the week  Continue current lifestyle changes Repeat BMP today Continue metformin XR 500 mg daily with dinner

## 2022-10-09 LAB — BASIC METABOLIC PANEL
BUN/Creatinine Ratio: 10 (ref 9–23)
BUN: 8 mg/dL (ref 6–20)
CO2: 24 mmol/L (ref 20–29)
Calcium: 9.9 mg/dL (ref 8.7–10.2)
Chloride: 101 mmol/L (ref 96–106)
Creatinine, Ser: 0.78 mg/dL (ref 0.57–1.00)
Glucose: 114 mg/dL — ABNORMAL HIGH (ref 70–99)
Potassium: 4.5 mmol/L (ref 3.5–5.2)
Sodium: 140 mmol/L (ref 134–144)
eGFR: 105 mL/min/{1.73_m2} (ref >59)

## 2022-10-23 ENCOUNTER — Encounter: Payer: Self-pay | Admitting: Nurse Practitioner

## 2022-10-23 ENCOUNTER — Ambulatory Visit (INDEPENDENT_AMBULATORY_CARE_PROVIDER_SITE_OTHER): Payer: BLUE CROSS/BLUE SHIELD | Admitting: Nurse Practitioner

## 2022-10-23 VITALS — BP 119/83 | HR 97 | Temp 98.8°F | Ht 63.0 in | Wt 157.5 lb

## 2022-10-23 DIAGNOSIS — Z6827 Body mass index (BMI) 27.0-27.9, adult: Secondary | ICD-10-CM

## 2022-10-23 DIAGNOSIS — R635 Abnormal weight gain: Secondary | ICD-10-CM

## 2022-10-23 DIAGNOSIS — D509 Iron deficiency anemia, unspecified: Secondary | ICD-10-CM

## 2022-10-23 MED ORDER — PHENTERMINE HCL 37.5 MG PO TABS: 37.5000 mg | ORAL_TABLET | Freq: Every day | ORAL | 0 refills | Status: DC

## 2022-10-23 NOTE — Progress Notes (Signed)
[facility] [address] [place], [place] [address]  Internal MEDICINE  Office Visit Note  Patient Name: [name]  [id number]  [id number]  Date of Service: 10/23/2022  Chief Complaint  Patient presents with   Follow-up    HPI [name] presents for a follow-up visit forweight loss.  Weight loss -- lost 10 lbs with diet , exericse-- walking a lot  And using phentermine  Not having any side effects . Blood pressure and heart rate are great      Current Medication: Outpatient Encounter Medications as of 10/23/2022  Medication Sig   [DISCONTINUED] BLISOVI 24 FE 1-20 MG-MCG(24) tablet Take 1 tablet by mouth daily.   [DISCONTINUED] buPROPion (WELLBUTRIN XL) 150 MG 24 hr tablet TAKE 1 TABLET BY MOUTH EVERY DAY   [DISCONTINUED] cyclobenzaprine (FLEXERIL) 10 MG tablet Take 0.5-1 tablets (5-10 mg total) by mouth 3 (three) times daily as needed for muscle spasms.   [DISCONTINUED] ketorolac (TORADOL) 10 MG tablet Take 1 tablet (10 mg total) by mouth every 8 (eight) hours.   [DISCONTINUED] ondansetron (ZOFRAN ODT) 4 MG disintegrating tablet Take 1 tablet (4 mg total) by mouth every 8 (eight) hours as needed.   [DISCONTINUED] phentermine (ADIPEX-P) 37.5 MG tablet Take 1 tablet (37.5 mg total) by mouth daily before breakfast. Start with 1/2 tablet daily for at least the first week.   [DISCONTINUED] QUEtiapine (SEROQUEL XR) 50 MG TB24 24 hr tablet TAKE 1 TABLET BY MOUTH EVERYDAY AT BEDTIME   phentermine (ADIPEX-P) 37.5 MG tablet Take 1 tablet (37.5 mg total) by mouth daily before breakfast.   No facility-administered encounter medications on file as of 10/23/2022.    Surgical History: Past Surgical History:  Procedure Laterality Date   TONSILLECTOMY     age 38    Medical History: Past Medical History:  Diagnosis Date   Anemia    Skin cancer     Family History: History reviewed. No pertinent family history.  Social History   Socioeconomic History   Marital status:  Single    Spouse name: Not on file   Number of children: Not on file   Years of education: Not on file   Highest education level: Not on file  Occupational History   Not on file  Tobacco Use   Smoking status: Never   Smokeless tobacco: Never  Vaping Use   Vaping Use: Never used  Substance and Sexual Activity   Alcohol use: Yes    Comment: social   Drug use: No   Sexual activity: Yes  Other Topics Concern   Not on file  Social History Narrative   Fiance   26 year old son, Gerre Pebbles    Social Determinants of Health   Financial Resource Strain: Not on file  Food Insecurity: Not on file  Transportation Needs: Not on file  Physical Activity: Not on file  Stress: Not on file  Social Connections: Not on file  Intimate Partner Violence: Not on file      Review of Systems  Constitutional:  Positive for activity change (walking more), appetite change and fatigue. Negative for chills and unexpected weight change (abnormal weight gain with difficulty losing weight while trying).  HENT:  Positive for postnasal drip. Negative for congestion, rhinorrhea, sinus pressure, sinus pain, sneezing and sore throat.   Respiratory: Negative.  Negative for cough, chest tightness, shortness of breath and wheezing.   Cardiovascular: Negative.  Negative for chest pain and palpitations.  Gastrointestinal: Negative.  Negative for abdominal pain, constipation,  diarrhea, nausea and vomiting.  Musculoskeletal: Negative.  Negative for arthralgias, back pain, joint swelling and neck pain.  Skin:  Negative for rash.  Neurological: Negative.   Psychiatric/Behavioral: Negative.  Negative for behavioral problems (Depression), sleep disturbance and suicidal ideas. The patient is not nervous/anxious.     Vital Signs: BP 119/83   Pulse 97   Temp 98.8 F (37.1 C)   Ht 5\' 3"  (1.6 m)   Wt 157 lb 7.5 oz (71.4 kg)   SpO2 96%   BMI 27.89 kg/m    Physical Exam Vitals reviewed.  Constitutional:       General: She is not in acute distress.    Appearance: Normal appearance. She is obese. She is not ill-appearing.  HENT:     Head: Normocephalic and atraumatic.  Eyes:     Pupils: Pupils are equal, round, and reactive to light.  Cardiovascular:     Rate and Rhythm: Normal rate and regular rhythm.  Pulmonary:     Effort: Pulmonary effort is normal. No respiratory distress.  Neurological:     Mental Status: She is alert and oriented to person, place, and time.  Psychiatric:        Mood and Affect: Mood normal.        Behavior: Behavior normal.        Assessment/Plan: 1. Abnormal weight gain Continue phentermine for another 4 weeks then follow up in office .  - phentermine (ADIPEX-P) 37.5 MG tablet; Take 1 tablet (37.5 mg total) by mouth daily before breakfast.  Dispense: 30 tablet; Refill: 0  2. BMI 27.0-27.9,adult Metabolic test done metabolic rate is faster than normal. Recommended daily calorie intake should be around 1500-1800 for weight loss. - Metabolic Test   General Counseling: [name] verbalizes understanding of the findings of todays visit and agrees with plan of treatment. I have discussed any further diagnostic evaluation that may be needed or ordered today. We also reviewed her medications today. she has been encouraged to call the office with any questions or concerns that should arise related to todays visit.    Orders Placed This Encounter  Procedures   Metabolic Test    Meds ordered this encounter  Medications   phentermine (ADIPEX-P) 37.5 MG tablet    Sig: Take 1 tablet (37.5 mg total) by mouth daily before breakfast.    Dispense:  30 tablet    Refill:  0    Return in about 4 weeks (around 11/20/2022) for F/U, Weight loss, [name] PCP.   Total time spent:30 Minutes Time spent includes review of chart, medications, test results, and follow up plan with the patient.    Controlled Substance Database was reviewed by me.  This patient was seen by Sallyanne Kuster, FNP-C in collaboration with Dr. Beverely Risen as a part of collaborative care agreement.   [name] R. Tedd Sias, MSN, FNP-C Internal medicine

## 2022-10-31 ENCOUNTER — Other Ambulatory Visit (INDEPENDENT_AMBULATORY_CARE_PROVIDER_SITE_OTHER): Payer: Self-pay | Admitting: Family Medicine

## 2022-10-31 DIAGNOSIS — E88819 Insulin resistance, unspecified: Secondary | ICD-10-CM

## 2022-11-03 ENCOUNTER — Ambulatory Visit (INDEPENDENT_AMBULATORY_CARE_PROVIDER_SITE_OTHER): Payer: 59

## 2022-11-03 ENCOUNTER — Other Ambulatory Visit: Payer: Self-pay | Admitting: Licensed Practical Nurse

## 2022-11-03 ENCOUNTER — Encounter: Payer: Self-pay | Admitting: Oncology

## 2022-11-03 DIAGNOSIS — R109 Unspecified abdominal pain: Secondary | ICD-10-CM

## 2022-11-03 DIAGNOSIS — R399 Unspecified symptoms and signs involving the genitourinary system: Secondary | ICD-10-CM

## 2022-11-03 LAB — POCT URINALYSIS DIPSTICK
Bilirubin, UA: NEGATIVE
Blood, UA: POSITIVE
Glucose, UA: NEGATIVE
Ketones, UA: NEGATIVE
Nitrite, UA: NEGATIVE
Protein, UA: NEGATIVE
Spec Grav, UA: 1.025 (ref 1.010–1.025)
Urobilinogen, UA: 0.2 E.U./dL
pH, UA: 6.5 (ref 5.0–8.0)

## 2022-11-03 NOTE — Progress Notes (Signed)
Pt with right flank pain since Saturday, did have some nausea and pain in upper gastric area-resolved with tums. Leuks on urine dip, blood present too but about to start cycle.  Urine culture ordered  Carie Caddy, CNM   [facility]  11/03/22  1:27 PM

## 2022-11-05 LAB — URINE CULTURE

## 2022-11-12 ENCOUNTER — Ambulatory Visit (INDEPENDENT_AMBULATORY_CARE_PROVIDER_SITE_OTHER): Payer: 59 | Admitting: Family Medicine

## 2022-11-12 ENCOUNTER — Encounter (INDEPENDENT_AMBULATORY_CARE_PROVIDER_SITE_OTHER): Payer: Self-pay | Admitting: Family Medicine

## 2022-11-12 VITALS — BP 136/96 | HR 78 | Temp 98.6°F | Ht 64.0 in | Wt 198.0 lb

## 2022-11-12 DIAGNOSIS — E559 Vitamin D deficiency, unspecified: Secondary | ICD-10-CM | POA: Diagnosis not present

## 2022-11-12 DIAGNOSIS — E538 Deficiency of other specified B group vitamins: Secondary | ICD-10-CM | POA: Diagnosis not present

## 2022-11-12 DIAGNOSIS — Z6833 Body mass index (BMI) 33.0-33.9, adult: Secondary | ICD-10-CM

## 2022-11-12 DIAGNOSIS — E669 Obesity, unspecified: Secondary | ICD-10-CM | POA: Diagnosis not present

## 2022-11-12 DIAGNOSIS — D500 Iron deficiency anemia secondary to blood loss (chronic): Secondary | ICD-10-CM

## 2022-11-12 DIAGNOSIS — E88819 Insulin resistance, unspecified: Secondary | ICD-10-CM

## 2022-11-12 NOTE — Assessment & Plan Note (Signed)
Last vitamin D Lab Results  Component Value Date   VD25OH 26.7 (L) 09/03/2022   She has been doing well on prescription vitamin D 50,000 IU once weekly.  Her energy level is stable.  She denies adverse side effects.  Recheck vitamin D level in August

## 2022-11-12 NOTE — Assessment & Plan Note (Signed)
Last fasting insulin elevated at 21 with a Homa IR of 3.7.  She was started on metformin XR 500 mg once daily with food but discontinued due to diarrhea.  This seems to be unrelated to what she was eating or timing of taking medication.  She is actively working on her prescribed meal plan and regular exercise with weight reduction.  Metformin XR was discontinued from her medication list.  She will continue working on a low sugar/low starch diet, regular exercise and weight loss.  Plan to recheck fasting insulin in the next 3 months.

## 2022-11-12 NOTE — Assessment & Plan Note (Signed)
She is consistent with taking ferrous gluconate 324 mg once daily with breakfast.  Her energy levels are improving.  She denies any heavy menstrual bleeding.  She has previously required IV iron infusions.  Continue ferrous gluconate 324 mg once daily with breakfast.  Recheck iron levels in August

## 2022-11-12 NOTE — Progress Notes (Signed)
Office: [phone]  /  Fax: [phone]  WEIGHT SUMMARY AND BIOMETRICS  Starting Date: 09/03/22  Starting Weight: 202 lb   Weight Lost Since Last Visit: 0   Vitals Temp: 98.6 F (37 C) BP: (!) 136/96 Pulse Rate: 78 SpO2: 100 %   Body Composition  Body Fat %: 43 % Fat Mass (lbs): 85.4 lbs Muscle Mass (lbs): 107.4 lbs Total Body Water (lbs): 77.8 lbs Visceral Fat Rating : 8     HPI  Chief Complaint: OBESITY  Natalie Wu is here to discuss her progress with her obesity treatment plan. Natalie Wu is on the the Category 2 Plan and states Natalie Wu is following her eating plan approximately 50 % of the time. Natalie Wu states Natalie Wu is exercising walking, stair stepper, home exercises, 30-45 minutes 4-5 times per week.   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 Natalie Wu did lose 1.2 pounds of muscle mass and gained 2.8 pounds of body fat since last visit.   Pharmacotherapy: None  PHYSICAL EXAM:  Blood pressure (!) 136/96, pulse 78, temperature 98.6 F (37 C), height 5\' 4"  (1.626 m), weight 198 lb (89.8 kg), SpO2 100 %, not currently breastfeeding. Body mass index is 33.99 kg/m.  General: Natalie Wu is overweight, cooperative, alert, well developed, and in no acute distress. PSYCH: Has normal mood, affect and thought process.   Lungs: Normal breathing effort, no conversational dyspnea.   ASSESSMENT AND PLAN  TREATMENT PLAN FOR OBESITY:  Recommended Dietary Goals  Natalie Wu is currently in the action stage of change. As such, her goal is to continue weight management plan. Natalie Wu has agreed to the Category 2 Plan.  Behavioral Intervention  We  discussed the following Behavioral Modification Strategies today: increasing lean protein intake, decreasing simple carbohydrates , increasing vegetables, increasing lower glycemic fruits, increasing water intake, work on meal planning and preparation, work on managing stress, creating time for self-care and relaxation measures, avoiding temptations and identifying enticing environmental cues, continue to practice mindfulness when eating, and planning for success.  Additional resources provided today: NA  Recommended Physical Activity Goals  Natalie Wu has been advised to work up to 150 minutes of moderate intensity aerobic activity a week and strengthening exercises 2-3 times per week for cardiovascular health, weight loss maintenance and preservation of muscle mass.   Natalie Wu has agreed to Increase the intensity, frequency or duration of aerobic exercises    Pharmacotherapy changes for the treatment of obesity: none  ASSOCIATED CONDITIONS ADDRESSED TODAY  Iron deficiency anemia due to chronic blood loss Assessment & Plan: Natalie Wu is consistent with taking ferrous gluconate 324 mg once daily with breakfast.  Her energy levels are improving.  Natalie Wu denies any heavy menstrual bleeding.  Natalie Wu has previously required IV iron infusions.  Continue ferrous gluconate 324 mg once daily with breakfast.  Recheck iron levels in August   Generalized obesity with starting BMI 34 Assessment & Plan: Reviewed patient's overall progress and her bioimpedance results. Natalie Wu has a net weight loss of 4 pounds in the past 2 months of medically supervised weight management.  High stress levels dealing with her  daughter have been factors and her progress over the past month.  Natalie Wu has a good support system at home and remains motivated to continue working on healthy lifestyle changes.  Natalie Wu is declined the need for antiobesity medications.  Resume prescribed meal plan, encourage consistent walking 30 to 40 minutes 5 days a  week.   BMI 33.0-33.9,adult  B12 deficiency Assessment & Plan: Her last B12 level was low at 270.  Natalie Wu is taking B12 1000 mcg daily.  Natalie Wu denies paresthesias, memory loss but has some fatigue.  Will recheck B12 level in August   Vitamin D deficiency Assessment & Plan: Last vitamin D Lab Results  Component Value Date   VD25OH 26.7 (L) 09/03/2022   Natalie Wu has been doing well on prescription vitamin D 50,000 IU once weekly.  Her energy level is stable.  Natalie Wu denies adverse side effects.  Recheck vitamin D level in August   Insulin resistance Assessment & Plan: Last fasting insulin elevated at 21 with a [place]oma IR of 3.7.  Natalie Wu was started on metformin XR 500 mg once daily with food but discontinued due to diarrhea.  This seems to be unrelated to what Natalie Wu was eating or timing of taking medication.  Natalie Wu is actively working on her prescribed meal plan and regular exercise with weight reduction.  Metformin XR was discontinued from her medication list.  Natalie Wu will continue working on a low sugar/low starch diet, regular exercise and weight loss.  Plan to recheck fasting insulin in the next 3 months.       Natalie Wu was informed of the importance of frequent follow up visits to maximize her success with intensive lifestyle modifications for her multiple health conditions.   ATTESTASTION STATEMENTS:  Reviewed by clinician on day of visit: allergies, medications, problem list, medical history, surgical history, family history, social history, and previous encounter notes pertinent to obesity diagnosis.   I have personally spent 30 minutes total time today in preparation, patient care, nutritional counseling and documentation for this visit, including the following: review of clinical lab tests; review of medical tests/procedures/services.      Natalie Brink, DO DABFM, DABOM [facility] [address] [place], [place] [address] [phone]

## 2022-11-12 NOTE — Assessment & Plan Note (Signed)
Reviewed patient's overall progress and her bioimpedance results. She has a net weight loss of 4 pounds in the past 2 months of medically supervised weight management.  High stress levels dealing with her daughter have been factors and her progress over the past month.  She has a good support system at home and remains motivated to continue working on healthy lifestyle changes.  She is declined the need for antiobesity medications.  Resume prescribed meal plan, encourage consistent walking 30 to 40 minutes 5 days a week.

## 2022-11-12 NOTE — Assessment & Plan Note (Signed)
Her last B12 level was low at 270.  She is taking B12 1000 mcg daily.  She denies paresthesias, memory loss but has some fatigue.  Will recheck B12 level in August

## 2022-11-12 NOTE — Progress Notes (Deleted)
   Office: [phone]  /  Fax: [phone]  WEIGHT SUMMARY AND BIOMETRICS  No data recorded No data recorded  No data recorded  No data recorded No data recorded   HPI  Chief Complaint: OBESITY  Natalie Wu is here to discuss her progress with her obesity treatment plan. She is on the following a lower carbohydrate, vegetable and lean protein rich diet plan and states she is following her eating plan approximately *** % of the time. She states she is exercising *** minutes *** times per week.   Interval History:  Since last office visit she ***   Pharmacotherapy: ***  PHYSICAL EXAM:  not currently breastfeeding. There is no height or weight on file to calculate BMI.  General: She is overweight, cooperative, alert, well developed, and in no acute distress. PSYCH: Has normal mood, affect and thought process.   Lungs: Normal breathing effort, no conversational dyspnea.   ASSESSMENT AND PLAN  TREATMENT PLAN FOR OBESITY:  Recommended Dietary Goals  Natalie Wu is currently in the action stage of change. As such, her goal is to continue weight management plan. She has agreed to {MWMwtlossportion/plan2:23431}.  Behavioral Intervention  We discussed the following Behavioral Modification Strategies today: {EMWMwtlossstrategies:28914::"increasing lean protein intake","decreasing simple carbohydrates ","increasing vegetables","increasing lower glycemic fruits","increasing water intake","continue to practice mindfulness when eating","planning for success"}.  Additional resources provided today: NA  Recommended Physical Activity Goals  Natalie Wu has been advised to work up to 150 minutes of moderate intensity aerobic activity a week and strengthening exercises 2-3 times per week for cardiovascular health, weight loss maintenance and preservation of muscle mass.   She has agreed to {EMEXERCISE:28847::"Think about ways to increase daily physical activity and overcoming barriers to  exercise"}  Pharmacotherapy changes for the treatment of obesity:   ASSOCIATED CONDITIONS ADDRESSED TODAY  There are no diagnoses linked to this encounter.    She was informed of the importance of frequent follow up visits to maximize her success with intensive lifestyle modifications for her multiple health conditions.   ATTESTASTION STATEMENTS:  Reviewed by clinician on day of visit: allergies, medications, problem list, medical history, surgical history, family history, social history, and previous encounter notes pertinent to obesity diagnosis.   I have personally spent 30 minutes total time today in preparation, patient care, nutritional counseling and documentation for this visit, including the following: review of clinical lab tests; review of medical tests/procedures/services.      Teressa Senter, CMA DABFM, DABOM [facility] [address] [place], [place] [address] [phone]

## 2022-12-02 ENCOUNTER — Other Ambulatory Visit: Payer: Self-pay | Admitting: Nurse Practitioner

## 2022-12-02 DIAGNOSIS — R635 Abnormal weight gain: Secondary | ICD-10-CM

## 2022-12-02 MED ORDER — PHENTERMINE HCL 37.5 MG PO TABS: 37.5000 mg | ORAL_TABLET | Freq: Every day | ORAL | 0 refills | Status: DC

## 2022-12-14 ENCOUNTER — Ambulatory Visit (INDEPENDENT_AMBULATORY_CARE_PROVIDER_SITE_OTHER): Payer: 59 | Admitting: Family Medicine

## 2022-12-30 ENCOUNTER — Other Ambulatory Visit: Payer: Self-pay | Admitting: Licensed Practical Nurse

## 2022-12-30 DIAGNOSIS — Z30011 Encounter for initial prescription of contraceptive pills: Secondary | ICD-10-CM

## 2023-01-05 ENCOUNTER — Other Ambulatory Visit: Payer: Self-pay | Admitting: Nurse Practitioner

## 2023-01-05 DIAGNOSIS — R635 Abnormal weight gain: Secondary | ICD-10-CM

## 2023-01-05 MED ORDER — PHENTERMINE HCL 37.5 MG PO TABS: 37.5000 mg | ORAL_TABLET | Freq: Every day | ORAL | 0 refills | Status: DC

## 2023-02-12 ENCOUNTER — Other Ambulatory Visit: Payer: Self-pay | Admitting: Licensed Practical Nurse

## 2023-02-12 DIAGNOSIS — R110 Nausea: Secondary | ICD-10-CM

## 2023-02-12 MED ORDER — ONDANSETRON HCL 4 MG PO TABS: 4.0000 mg | ORAL_TABLET | Freq: Every day | ORAL | 1 refills | Status: DC | PRN

## 2023-02-18 ENCOUNTER — Encounter: Payer: Self-pay | Admitting: Certified Nurse Midwife

## 2023-02-18 ENCOUNTER — Ambulatory Visit (INDEPENDENT_AMBULATORY_CARE_PROVIDER_SITE_OTHER): Payer: 59 | Admitting: Certified Nurse Midwife

## 2023-02-18 VITALS — BP 122/87 | HR 72 | Ht 63.0 in | Wt 184.7 lb

## 2023-02-18 DIAGNOSIS — E669 Obesity, unspecified: Secondary | ICD-10-CM

## 2023-02-18 DIAGNOSIS — Z6832 Body mass index (BMI) 32.0-32.9, adult: Secondary | ICD-10-CM | POA: Diagnosis not present

## 2023-02-18 DIAGNOSIS — Z713 Dietary counseling and surveillance: Secondary | ICD-10-CM | POA: Diagnosis not present

## 2023-02-18 DIAGNOSIS — Z7689 Persons encountering health services in other specified circumstances: Secondary | ICD-10-CM

## 2023-02-18 MED ORDER — CYANOCOBALAMIN 1000 MCG/ML IJ SOLN: 1000.0000 ug | Freq: Once | INTRAMUSCULAR | 5 refills | Status: AC

## 2023-02-18 MED ORDER — PHENTERMINE HCL 37.5 MG PO TABS: 37.5000 mg | ORAL_TABLET | Freq: Every day | ORAL | 0 refills | Status: DC

## 2023-02-18 MED ORDER — CYANOCOBALAMIN 1000 MCG/ML IJ SOLN
1000.0000 ug | Freq: Once | INTRAMUSCULAR | Status: AC
Start: 2023-02-18 — End: 2023-02-18
  Administered 2023-02-18: 1000 ug via INTRAMUSCULAR

## 2023-02-18 NOTE — Progress Notes (Signed)
Subjective:  Natalie Wu is a 32 y.o. G1P1001 at Unknown being seen today for weight loss management- initial visit.  Patient reports General ROS: negative and reports previous weight loss attempts: She was using the injectable medications but has had to stop due to cost. She states the medication was working for her.   Management changes made at the last visit include:  adding medication, Semaglutide  Onset was gradual, with pregnancy Onset followed:   pregnancy last year.  Associated symptoms include: fatigue,  change in clothing fit  Previous/Current treatment includes: exercise stair climber daily x 30 min. Walking 2-3 x wk 15-20 min.   Pertinent medical history includes: none  Risk factors include: busy lifestyle with child and family obligation, work.  The patient has a surgical history of: gallbladder, foots surgery, tonsil, wisdom teeth.  Pertinent social history includes:  None Past evaluation has included: comprehensive metabolic profile, hemoglobin A1c, thyroid panel  and lipid 09/03/2022  Past treatment has included: small frequent feedings, appetite stimulant, exercise management   The following portions of the patient's history were reviewed and updated as appropriate: allergies, current medications, past family history, past medical history, past social history, past surgical history and problem list.   Objective:   Vitals:   02/18/23 1111  BP: 122/87  Pulse: 72  Weight: 184 lb 11.2 oz (83.8 kg)  Height: 5\' 3"  (1.6 m)    General:  Alert, oriented and cooperative. Patient is in no acute distress.  PE: Well groomed female in no current distress,   Mental Status: Normal mood and affect. Normal behavior. Normal judgment and thought content.   Current BMI: Body mass index is 32.72 kg/m.   Assessment and Plan:  Obesity  There are no diagnoses linked to this encounter.  Plan: low carb, High protein diet RX for adipex 37.5 mg daily and B12 .ml monthly, to  start now with first injection given at today's visit. Reviewed side-effects common to both medications and expected outcomes. Increase daily water intake to at least 8 bottle a day, every day.  Goal is to reduse weight by 5% (9lbs) by end of three months, and will re-evaluate then.   RTC in 4 weeks for Nurse visit to check weight & BP, and get next B12 injections.  Please refer to After Visit Summary for other counseling recommendations.    Doreene Burke, CNM        Consider the Low Glycemic Index Diet and 6 smaller meals daily .  This boosts your metabolism and regulates your sugars:   Use the protein bar by [employer] because they have lots of fiber in them  Find the low carb flatbreads, tortillas and pita breads for sandwiches:  Joseph's makes a pita bread and a flat bread , available at [facility] and [employer]; Toufayah makes a low carb flatbread available at [employer] and [employer] that is 9 net carbs and 100 cal [place] makes a low carb whole wheat tortilla available at [employer] most grocery stores with 6 net carbs and 210 cal  [place] yogurt can still have a lot of carbs .  Dannon Light N fit has 80 cal and 8 carbs

## 2023-02-19 ENCOUNTER — Other Ambulatory Visit: Payer: Self-pay | Admitting: Nurse Practitioner

## 2023-02-19 DIAGNOSIS — R635 Abnormal weight gain: Secondary | ICD-10-CM

## 2023-02-19 MED ORDER — PHENTERMINE HCL 37.5 MG PO TABS: 37.5000 mg | ORAL_TABLET | Freq: Every day | ORAL | 0 refills | Status: DC

## 2023-03-17 ENCOUNTER — Other Ambulatory Visit: Payer: Self-pay

## 2023-03-17 MED ORDER — AMOXICILLIN-POT CLAVULANATE 875-125 MG PO TABS: 1.0000 | ORAL_TABLET | Freq: Two times a day (BID) | ORAL | 0 refills | Status: DC

## 2023-03-17 NOTE — Telephone Encounter (Signed)
As per alyssa sent amoxicillin due ear infection right

## 2023-03-18 ENCOUNTER — Other Ambulatory Visit: Payer: Self-pay

## 2023-03-18 MED ORDER — OFLOXACIN 0.3 % OT SOLN: 2.0000 [drp] | Freq: Every day | OTIC | 0 refills | Status: DC

## 2023-03-18 NOTE — Telephone Encounter (Signed)
As per [name] sent ear drops

## 2023-03-24 ENCOUNTER — Encounter: Payer: Self-pay | Admitting: Certified Nurse Midwife

## 2023-03-24 ENCOUNTER — Ambulatory Visit: Payer: 59 | Admitting: Certified Nurse Midwife

## 2023-03-24 VITALS — BP 154/100 | HR 84 | Ht 63.0 in | Wt 182.0 lb

## 2023-03-24 DIAGNOSIS — Z3A32 32 weeks gestation of pregnancy: Secondary | ICD-10-CM | POA: Diagnosis not present

## 2023-03-24 DIAGNOSIS — E669 Obesity, unspecified: Secondary | ICD-10-CM | POA: Diagnosis not present

## 2023-03-24 DIAGNOSIS — Z713 Dietary counseling and surveillance: Secondary | ICD-10-CM | POA: Diagnosis not present

## 2023-03-24 DIAGNOSIS — Z7689 Persons encountering health services in other specified circumstances: Secondary | ICD-10-CM

## 2023-03-24 NOTE — Progress Notes (Signed)
SUBJECTIVE:  32 y.o. here for follow-up weight loss visit, previously seen 4 weeks ago. Denies any concerns and feels like medication is working. She has lost 2 lbs this past month. She has increased protein in her diet and has increased exercise time 45 minutes 4-5 x week. She denies and issues with taking medication , palpitation, SOB, chest discomfort.   OBJECTIVE:  BP (!) 154/100   Pulse 84   Ht 5\' 3"  (1.6 m)   Wt 182 lb (82.6 kg)   BMI 32.24 kg/m   Body mass index is 32.24 kg/m. Waist: 40   BP elevated , pt at work and running around (busy) . Rpt after resting: 140/100 Patient appears well. ASSESSMENT:  Obesity- responding well to weight loss plan PLAN:  Given BP elevated today . Will repeat on Friday. RTC for BP check .   Doreene Burke, CNM

## 2023-03-30 ENCOUNTER — Encounter: Payer: Self-pay | Admitting: Certified Nurse Midwife

## 2023-03-30 ENCOUNTER — Ambulatory Visit (INDEPENDENT_AMBULATORY_CARE_PROVIDER_SITE_OTHER): Payer: 59 | Admitting: Certified Nurse Midwife

## 2023-03-30 VITALS — BP 112/70 | HR 88 | Ht 63.0 in | Wt 183.0 lb

## 2023-03-30 DIAGNOSIS — Z0130 Encounter for examination of blood pressure without abnormal findings: Secondary | ICD-10-CM

## 2023-03-30 MED ORDER — PHENTERMINE HCL 37.5 MG PO TABS: 37.5000 mg | ORAL_TABLET | Freq: Every day | ORAL | 0 refills | Status: DC

## 2023-03-30 NOTE — Progress Notes (Signed)
BP check , pt seen 10/23/ for weight loss visit. BP elevated. She is here for repeat BP.   Today's Vitals   03/30/23 1449  BP: 112/70  Pulse: 88  Weight: 183 lb (83 kg)  Height: 5\' 3"  (1.6 m)   Body mass index is 32.42 kg/m.   Order placed for phentermine.   Follow up 1 month for weight/bp check.   Doreene Burke, CNM

## 2023-05-12 ENCOUNTER — Other Ambulatory Visit: Payer: Self-pay | Admitting: Licensed Practical Nurse

## 2023-05-12 DIAGNOSIS — F419 Anxiety disorder, unspecified: Secondary | ICD-10-CM

## 2023-05-12 MED ORDER — LORAZEPAM 1 MG PO TABS
1.0000 mg | ORAL_TABLET | Freq: Once | ORAL | 0 refills | Status: AC
Start: 2023-05-12 — End: 2023-05-12

## 2023-05-20 ENCOUNTER — Encounter: Payer: Self-pay | Admitting: Licensed Practical Nurse

## 2023-05-20 ENCOUNTER — Ambulatory Visit: Payer: 59 | Admitting: Licensed Practical Nurse

## 2023-05-20 VITALS — BP 148/106 | HR 79 | Wt 180.0 lb

## 2023-05-20 DIAGNOSIS — Z30430 Encounter for insertion of intrauterine contraceptive device: Secondary | ICD-10-CM | POA: Diagnosis not present

## 2023-05-20 MED ORDER — LEVONORGESTREL 20 MCG/DAY IU IUD
1.0000 | INTRAUTERINE_SYSTEM | Freq: Once | INTRAUTERINE | Status: AC
  Administered 2023-05-20: 1 via INTRAUTERINE

## 2023-05-20 NOTE — Progress Notes (Signed)
   GYNECOLOGY OFFICE PROCEDURE NOTE  Natalie Wu is a 32 y.o. G1P1001 here for Mirena IUD insertion. No GYN concerns.  Last pap smear was on 01/21/22 and was normal.  The patient is currently using OCP for contraception and her LMP is Patient's last menstrual period was 05/19/2023 (approximate)..  The indication for her IUD is contraception/cycle control.  IUD Insertion Procedure Note Patient identified, informed consent performed, consent signed.   Discussed risks of irregular bleeding, cramping, infection, malpositioning, expulsion or uterine perforation of the IUD (1:1000 placements)  which may require further procedure such as laparoscopy.  IUD while effective at preventing pregnancy do not prevent transmission of sexually transmitted diseases and use of barrier methods for this purpose was discussed. Time out was performed.  Urine pregnancy test negative.  Speculum placed in the vagina.  Cervix visualized.  4%lidocaine gel applied about 2 mins passed and then cervix was  Cleaned with Betadine x 2.  Uterus sounded to 6.5 cm. IUD placed per manufacturer's recommendations.  Strings trimmed to 2 cm. Patient tolerated procedure well.   Patient was given post-procedure instructions.  She was advised to have backup contraception for one week.  Patient was also asked to check IUD strings periodically and follow up in 6 weeks for IUD check.  Needs to schedule annual exam, may do string check then.     Carie Caddy, CNM  [facility]

## 2023-06-16 ENCOUNTER — Ambulatory Visit: Payer: 59 | Admitting: Licensed Practical Nurse

## 2023-06-16 ENCOUNTER — Encounter: Payer: Self-pay | Admitting: Licensed Practical Nurse

## 2023-06-16 VITALS — BP 130/92 | HR 87 | Ht 63.0 in | Wt 180.0 lb

## 2023-06-16 DIAGNOSIS — F419 Anxiety disorder, unspecified: Secondary | ICD-10-CM

## 2023-06-16 MED ORDER — ESCITALOPRAM OXALATE 20 MG PO TABS
20.0000 mg | ORAL_TABLET | Freq: Every day | ORAL | 6 refills | Status: DC
Start: 2023-06-16 — End: 2023-07-14

## 2023-06-16 NOTE — Progress Notes (Signed)
 SUBJECTIVE: Here for medication follow up Was started on Lexapro  a while ago but then stopped. About 2 weeks ago she started taking it again, is taking 2 10mg  tablets. States she and her partner can notice a difference when she is on this medication. Reports the medication is helping but she continues to feel down and depressed and has frequent episodes of anxiety. Admits she is under a lot of stress d/t her daughter needing frequent medical visits (frequent ear infections, feeding issues requiring therapy).This past week she has had crying episodes daily. Joaquin has started to exercise on the treadmill and stair stepper. She recently started the [employer] program for weight loss. She does not do any mindfulness activities except affirmation cards at night. She is not too interested in mindfulness. At this time she not interested in therapy but may consider it. She has spoken with her partner about her struggles, mainly with parenting, he seems to have responded by now doing more to parent their child. Reports that she sleeps fairly well.   OBJECTIVE: BP (!) 130/92   Pulse 87   Ht 5\' 3"  (1.6 m)   Wt 180 lb (81.6 kg)   LMP 05/19/2023 (Approximate)   Breastfeeding No   BMI 31.89 kg/m   GEN: NAD,   PQH-9 3 GAD-7  -3  ASSESSMENT: Anxiety  Elevated Blood Pressure   PLAN: -Offered adding Buspirone, reviewed warning for Serotonin Syndrome. Gola would prefer to try Lexapro  20mg  for another 2-4 weeks  -Schedule annual exam in 2-4 weeks, will reassess blood pressure at this visit.   Anice Kerbs, CNM  [facility]  06/16/23  1:14 PM

## 2023-07-11 ENCOUNTER — Other Ambulatory Visit: Payer: Self-pay | Admitting: Licensed Practical Nurse

## 2023-07-11 DIAGNOSIS — F419 Anxiety disorder, unspecified: Secondary | ICD-10-CM

## 2023-07-24 ENCOUNTER — Ambulatory Visit
Admission: EM | Admit: 2023-07-24 | Discharge: 2023-07-24 | Disposition: A | Discharge status: Home / Self Care | Payer: 59 | Attending: Emergency Medicine | Admitting: Emergency Medicine

## 2023-07-24 ENCOUNTER — Encounter: Payer: Self-pay | Admitting: Emergency Medicine

## 2023-07-24 DIAGNOSIS — J069 Acute upper respiratory infection, unspecified: Secondary | ICD-10-CM | POA: Diagnosis not present

## 2023-07-24 MED ORDER — BENZONATATE 100 MG PO CAPS: 200.0000 mg | ORAL_CAPSULE | Freq: Three times a day (TID) | ORAL | 0 refills | Status: AC

## 2023-07-24 MED ORDER — PROMETHAZINE-DM 6.25-15 MG/5ML PO SYRP: 5.0000 mL | ORAL_SOLUTION | Freq: Four times a day (QID) | ORAL | 0 refills | Status: AC | PRN

## 2023-07-24 MED ORDER — IPRATROPIUM BROMIDE 0.06 % NA SOLN: 2.0000 | Freq: Four times a day (QID) | NASAL | 12 refills | Status: AC

## 2023-07-24 NOTE — ED Provider Notes (Signed)
 [facility]    CSN: [id number] Arrival date & time: 07/24/23  1134      History   Chief Complaint Chief Complaint  Patient presents with   Cough    HPI Natalie Wu is a 33 y.o. female.   HPI  33 year old female with a past medical history significant for GERD, high blood pressure, high cholesterol, IDA, and migraine headaches presents for evaluation of respiratory symptoms that began 5 days ago and consist of runny nose, nasal congestion, nonproductive cough that is worse at night, and intermittently wheezing at night.  She denies any fever, shortness of breath, sore throat, or GI symptoms.  Past Medical History:  Diagnosis Date   Anemia    Complication of anesthesia    Slow to wake up   GERD (gastroesophageal reflux disease)    High blood pressure    High cholesterol    Iron deficiency anemia    Migraine     Patient Active Problem List   Diagnosis Date Noted   Insulin resistance 09/23/2022   Mixed hyperlipidemia 09/23/2022   Elevated blood pressure reading in office without diagnosis of hypertension 08/18/2022   Picky eater 08/18/2022   Acute non-recurrent frontal sinusitis 04/11/2021   Obesity (BMI 30-39.9) 12/27/2020   B12 deficiency 10/08/2020   Vitamin D deficiency 10/08/2020   Chronic headaches 03/20/2019   Weight loss counseling, encounter for 08/02/2018   Situational anxiety 09/20/2017   Iron deficiency anemia 06/27/2015    Past Surgical History:  Procedure Laterality Date   CHOLECYSTECTOMY N/A 04/03/2019   Procedure: LAPAROSCOPIC CHOLECYSTECTOMY;  Surgeon: Henrene Dodge, MD;  Location: [facility] ORS;  Service: General;  Laterality: N/A;   FOOT SURGERY     TONSILLECTOMY     WISDOM TOOTH EXTRACTION      OB History     Gravida  1   Para  1   Term  1   Preterm      AB      Living  1      SAB      IAB      Ectopic      Multiple  0   Live Births  1            Home Medications    Prior to Admission medications    Medication Sig Start Date End Date Taking? Authorizing Provider  benzonatate (TESSALON) 100 MG capsule Take 2 capsules (200 mg total) by mouth every 8 (eight) hours. 07/24/23  Yes Becky Augusta, NP  ipratropium (ATROVENT) 0.06 % nasal spray Place 2 sprays into both nostrils 4 (four) times daily. 07/24/23  Yes Becky Augusta, NP  promethazine-dextromethorphan (PROMETHAZINE-DM) 6.25-15 MG/5ML syrup Take 5 mLs by mouth 4 (four) times daily as needed. 07/24/23  Yes Becky Augusta, NP  escitalopram (LEXAPRO) 20 MG tablet TAKE 1 TABLET BY MOUTH EVERY DAY 07/14/23   Dominic, Courtney Heys, CNM    Family History Family History  Problem Relation Age of Onset   Hyperlipidemia Father    Hypertension Father    Obesity Father    Breast cancer Maternal Grandmother    Lung cancer Maternal Grandfather    Bone cancer Paternal Grandmother    Cancer Paternal Grandmother    Breast cancer Maternal Uncle     Social History Social History   Tobacco Use   Smoking status: Never   Smokeless tobacco: Never  Vaping Use   Vaping status: Never Used  Substance Use Topics   Alcohol use: Not Currently  Comment: occasional   Drug use: No     Allergies   Patient has no known allergies.   Review of Systems Review of Systems  Constitutional:  Negative for fever.  HENT:  Positive for congestion, postnasal drip and rhinorrhea. Negative for ear pain and sore throat.   Respiratory:  Positive for cough and wheezing. Negative for shortness of breath.      Physical Exam Triage Vital Signs ED Triage Vitals  Encounter Vitals Group     BP      Systolic BP Percentile      Diastolic BP Percentile      Pulse      Resp      Temp      Temp src      SpO2      Weight      Height      Head Circumference      Peak Flow      Pain Score      Pain Loc      Pain Education      Exclude from Growth Chart    No data found.  Updated Vital Signs BP (!) 145/94 (BP Location: Right Arm)   Pulse 93   Temp 99 F (37.2  C) (Oral)   Resp 14   Ht 5\' 3"  (1.6 m)   Wt 179 lb 14.3 oz (81.6 kg)   SpO2 98%   Breastfeeding No   BMI 31.87 kg/m   Visual Acuity Right Eye Distance:   Left Eye Distance:   Bilateral Distance:    Right Eye Near:   Left Eye Near:    Bilateral Near:     Physical Exam Vitals and nursing note reviewed.  Constitutional:      Appearance: Normal appearance. She is not ill-appearing.  HENT:     Head: Normocephalic and atraumatic.     Right Ear: Tympanic membrane, ear canal and external ear normal. There is no impacted cerumen.     Left Ear: Tympanic membrane, ear canal and external ear normal. There is no impacted cerumen.     Nose: Congestion and rhinorrhea present.     Comments: Nasal mucosa is erythematous and edematous with clear discharge in both nares.    Mouth/Throat:     Mouth: Mucous membranes are moist.     Pharynx: Oropharynx is clear. No oropharyngeal exudate or posterior oropharyngeal erythema.  Cardiovascular:     Rate and Rhythm: Normal rate and regular rhythm.     Pulses: Normal pulses.     Heart sounds: Normal heart sounds. No murmur heard.    No friction rub. No gallop.  Pulmonary:     Effort: Pulmonary effort is normal.     Breath sounds: Normal breath sounds. No wheezing, rhonchi or rales.  Musculoskeletal:     Cervical back: Normal range of motion and neck supple. No tenderness.  Lymphadenopathy:     Cervical: No cervical adenopathy.  Skin:    General: Skin is warm and dry.     Capillary Refill: Capillary refill takes less than 2 seconds.     Findings: No rash.  Neurological:     General: No focal deficit present.     Mental Status: She is alert and oriented to person, place, and time.      UC Treatments / Results  Labs (all labs ordered are listed, but only abnormal results are displayed) Labs Reviewed - No data to display  EKG   Radiology No results found.  Procedures Procedures (including critical care time)  Medications Ordered  in UC Medications - No data to display  Initial Impression / Assessment and Plan / UC Course  I have reviewed the triage vital signs and the nursing notes.  Pertinent labs & imaging results that were available during my care of the patient were reviewed by me and considered in my medical decision making (see chart for details).   Patient is a pleasant, nontoxic-appearing 33 year old female presenting for evaluation of respiratory symptoms as outlined HPI above.  Her most significant symptom is her cough and she reports it is worse at night.  She has had some runny nose and nasal congestion as well.  Her partner recently was treated for strep and her daughter was treated for influenza.  Her physical exam does reveal inflammation of her upper respiratory tract but her cardiopulmonary exam is benign.  Differential diagnose include COVID, influenza, viral respiratory illness.  Given that she is on day 5 of symptoms she is outside the therapeutic and infectious window for both COVID and influenza swab would not test her at this time.  I will discharge her home with Atrovent nasal spray to help with nasal congestion and Tessalon Perles and Promethazine DM cough syrup for cough and congestion.  Return precautions reviewed.   Final Clinical Impressions(s) / UC Diagnoses   Final diagnoses:  Viral URI with cough     Discharge Instructions      Rest, your exam is consistent with a viral respiratory infection.  Please do over-the-counter Tylenol and/or ibuprofen according the pack instructions as needed for any fever or pain.  Use the Atrovent nasal spray, 2 squirts in each nostril every 6 hours, as needed for runny nose and postnasal drip.  Use the Tessalon Perles every 8 hours during the day.  Take them with a small sip of water.  They may give you some numbness to the base of your tongue or a metallic taste in your mouth, this is normal.  Use the Promethazine DM cough syrup at bedtime for cough  and congestion.  It will make you drowsy so do not take it during the day.  Return for reevaluation or see your primary care provider for any new or worsening symptoms.      ED Prescriptions     Medication Sig Dispense Auth. Provider   benzonatate (TESSALON) 100 MG capsule Take 2 capsules (200 mg total) by mouth every 8 (eight) hours. 21 capsule Becky Augusta, NP   ipratropium (ATROVENT) 0.06 % nasal spray Place 2 sprays into both nostrils 4 (four) times daily. 15 mL Becky Augusta, NP   promethazine-dextromethorphan (PROMETHAZINE-DM) 6.25-15 MG/5ML syrup Take 5 mLs by mouth 4 (four) times daily as needed. 118 mL Becky Augusta, NP      PDMP not reviewed this encounter.   Becky Augusta, NP 07/24/23 1321

## 2023-07-24 NOTE — ED Triage Notes (Signed)
Patient c/o cough and chest congestion for a week.  Patient denies fevers.  °

## 2023-07-24 NOTE — Discharge Instructions (Signed)
 Rest, your exam is consistent with a viral respiratory infection.  Please do over-the-counter Tylenol and/or ibuprofen according the pack instructions as needed for any fever or pain.  Use the Atrovent nasal spray, 2 squirts in each nostril every 6 hours, as needed for runny nose and postnasal drip.  Use the Tessalon Perles every 8 hours during the day.  Take them with a small sip of water.  They may give you some numbness to the base of your tongue or a metallic taste in your mouth, this is normal.  Use the Promethazine DM cough syrup at bedtime for cough and congestion.  It will make you drowsy so do not take it during the day.  Return for reevaluation or see your primary care provider for any new or worsening symptoms.

## 2023-08-09 ENCOUNTER — Other Ambulatory Visit: Payer: Self-pay

## 2023-08-09 MED ORDER — METHOCARBAMOL 500 MG PO TABS: 500.0000 mg | ORAL_TABLET | Freq: Four times a day (QID) | ORAL | 0 refills | Status: AC

## 2023-08-09 NOTE — Telephone Encounter (Signed)
 As per alyssa sent muscle relaxer for back pain

## 2023-09-06 ENCOUNTER — Other Ambulatory Visit: Payer: Self-pay | Admitting: Nurse Practitioner

## 2023-09-06 MED ORDER — OFLOXACIN 0.3 % OP SOLN: 1.0000 [drp] | Freq: Four times a day (QID) | OPHTHALMIC | 0 refills | Status: AC

## 2023-10-01 ENCOUNTER — Encounter: Payer: Self-pay | Admitting: Nurse Practitioner

## 2023-10-01 ENCOUNTER — Ambulatory Visit: Admitting: Nurse Practitioner

## 2023-10-01 VITALS — BP 122/80 | HR 82 | Temp 98.0°F | Resp 16 | Ht 63.0 in | Wt 154.0 lb

## 2023-10-01 DIAGNOSIS — D509 Iron deficiency anemia, unspecified: Secondary | ICD-10-CM

## 2023-10-01 DIAGNOSIS — E663 Overweight: Secondary | ICD-10-CM

## 2023-10-01 DIAGNOSIS — Z6827 Body mass index (BMI) 27.0-27.9, adult: Secondary | ICD-10-CM | POA: Diagnosis not present

## 2023-10-01 MED ORDER — DIETHYLPROPION HCL ER 75 MG PO TB24: 75.0000 mg | ORAL_TABLET | Freq: Every day | ORAL | 1 refills | Status: DC

## 2023-10-01 NOTE — Progress Notes (Signed)
 [facility] [address] [place], [place] [address]  Internal MEDICINE  Office Visit Note  Patient Name: [name]  [id number]  [id number]  Date of Service: 10/01/2023  Chief Complaint  Patient presents with   Follow-up   Weight Loss    HPI [name] presents for a follow-up visit for weight gain and history of anemia.  Last office visit was in may last year for weight loss. She had been losing weight while taking phentermine  and had also been eating a healthy diet and walking daily. She has not taken phentermine  for several months.  Interested in restarting phentermine  or trying something else to help with weight loss.  Current weight is actually 3 lbs less than her weight at her visit in may last year.    Current Medication: Outpatient Encounter Medications as of 10/01/2023  Medication Sig   Diethylpropion  HCl CR 75 MG TB24 Take 1 tablet (75 mg total) by mouth daily before breakfast.   methocarbamol  (ROBAXIN ) 500 MG tablet Take 1 tablet (500 mg total) by mouth 4 (four) times daily. As needed   phentermine  (ADIPEX-P ) 37.5 MG tablet Take 1 tablet (37.5 mg total) by mouth daily before breakfast.   No facility-administered encounter medications on file as of 10/01/2023.    Surgical History: Past Surgical History:  Procedure Laterality Date   TONSILLECTOMY     age 33    Medical History: Past Medical History:  Diagnosis Date   Anemia    Skin cancer     Family History: No family history on file.  Social History   Socioeconomic History   Marital status: Single    Spouse name: Not on file   Number of children: Not on file   Years of education: Not on file   Highest education level: Not on file  Occupational History   Not on file  Tobacco Use   Smoking status: Never   Smokeless tobacco: Never  Vaping Use   Vaping status: Never Used  Substance and Sexual Activity   Alcohol use: Yes    Comment: social   Drug use: No   Sexual activity: Yes  Other  Topics Concern   Not on file  Social History Narrative   Fiance   51 year old son, Marcille Severance    Social Drivers of Corporate investment banker Strain: Not on [employer] Insecurity: Not on file  Transportation Needs: Not on file  Physical Activity: Not on file  Stress: Not on file  Social Connections: Not on file  Intimate Partner Violence: Not on file      Review of Systems  Constitutional:  Positive for activity change (walking more), appetite change and fatigue. Negative for chills and unexpected weight change (abnormal weight gain with difficulty losing weight while trying).  HENT:  Positive for postnasal drip. Negative for congestion, rhinorrhea, sinus pressure, sinus pain, sneezing and sore throat.   Respiratory: Negative.  Negative for cough, chest tightness, shortness of breath and wheezing.   Cardiovascular: Negative.  Negative for chest pain and palpitations.  Gastrointestinal: Negative.  Negative for abdominal pain, constipation, diarrhea, nausea and vomiting.  Musculoskeletal: Negative.  Negative for arthralgias, back pain, joint swelling and neck pain.  Skin:  Negative for rash.  Neurological: Negative.   Psychiatric/Behavioral: Negative.  Negative for behavioral problems (Depression), sleep disturbance and suicidal ideas. The patient is not nervous/anxious.     Vital Signs: Resp 16   Ht 5\' 3"  (1.6 m)   Wt 154 lb (  69.9 kg)   LMP 09/03/2023   Breastfeeding No   BMI 27.28 kg/m    Physical Exam Vitals reviewed.  Constitutional:      General: She is not in acute distress.    Appearance: Normal appearance. She is obese. She is not ill-appearing.  HENT:     Head: Normocephalic and atraumatic.  Eyes:     Pupils: Pupils are equal, round, and reactive to light.  Cardiovascular:     Rate and Rhythm: Normal rate and regular rhythm.  Pulmonary:     Effort: Pulmonary effort is normal. No respiratory distress.  Neurological:     Mental Status: She is alert and  oriented to person, place, and time.  Psychiatric:        Mood and Affect: Mood normal.        Behavior: Behavior normal.       Assessment/Plan: 1. Iron deficiency anemia, unspecified iron deficiency anemia type (Primary) Stable, no new symptoms or issues. Will recheck later.   2. Overweight with body mass index (BMI) of 27 to 27.9 in adult Will try diethylpropion  this time instead of phentermine  to see if she gets better results  - Diethylpropion  HCl CR 75 MG TB24; Take 1 tablet (75 mg total) by mouth daily before breakfast.  Dispense: 30 tablet; Refill: 1   General Counseling: [name] verbalizes understanding of the findings of todays visit and agrees with plan of treatment. I have discussed any further diagnostic evaluation that may be needed or ordered today. We also reviewed her medications today. she has been encouraged to call the office with any questions or concerns that should arise related to todays visit.    No orders of the defined types were placed in this encounter.   Meds ordered this encounter  Medications   Diethylpropion  HCl CR 75 MG TB24    Sig: Take 1 tablet (75 mg total) by mouth daily before breakfast.    Dispense:  30 tablet    Refill:  1    Fill new script today    Return in about 8 weeks (around 11/26/2023) for F/U, Weight loss, Marigrace Mccole PCP.   Total time spent:30 Minutes Time spent includes review of chart, medications, test results, and follow up plan with the patient.   [place] Controlled Substance Database was reviewed by me.  This patient was seen by Laurence Pons, FNP-C in collaboration with Dr. Verneta Gone as a part of collaborative care agreement.   Sharif Rendell R. Bobbi Burow, MSN, FNP-C Internal medicine

## 2023-10-06 ENCOUNTER — Other Ambulatory Visit: Payer: Self-pay | Admitting: Nurse Practitioner

## 2023-10-06 MED ORDER — FLUCONAZOLE 150 MG PO TABS: 150.0000 mg | ORAL_TABLET | Freq: Once | ORAL | 0 refills | Status: AC

## 2023-10-08 ENCOUNTER — Other Ambulatory Visit: Payer: Self-pay

## 2023-10-08 MED ORDER — AMOXICILLIN 500 MG PO TABS: 500.0000 mg | ORAL_TABLET | Freq: Two times a day (BID) | ORAL | 0 refills | Status: DC

## 2023-10-28 ENCOUNTER — Other Ambulatory Visit: Payer: Self-pay

## 2023-10-28 MED ORDER — AZITHROMYCIN 250 MG PO TABS: ORAL_TABLET | ORAL | 0 refills | Status: DC

## 2023-10-28 NOTE — Telephone Encounter (Signed)
 As per alyssa sent Zpak

## 2023-10-29 ENCOUNTER — Encounter: Payer: Self-pay | Admitting: Nurse Practitioner

## 2023-11-23 ENCOUNTER — Other Ambulatory Visit: Payer: Self-pay | Admitting: Nurse Practitioner

## 2023-11-23 DIAGNOSIS — R635 Abnormal weight gain: Secondary | ICD-10-CM

## 2023-11-23 MED ORDER — PHENTERMINE HCL 37.5 MG PO TABS: 37.5000 mg | ORAL_TABLET | Freq: Every day | ORAL | 0 refills | Status: AC

## 2023-11-30 ENCOUNTER — Ambulatory Visit (INDEPENDENT_AMBULATORY_CARE_PROVIDER_SITE_OTHER)

## 2023-11-30 DIAGNOSIS — R079 Chest pain, unspecified: Secondary | ICD-10-CM | POA: Diagnosis not present

## 2023-12-13 ENCOUNTER — Other Ambulatory Visit: Payer: Self-pay | Admitting: Obstetrics

## 2023-12-13 DIAGNOSIS — W57XXXA Bitten or stung by nonvenomous insect and other nonvenomous arthropods, initial encounter: Secondary | ICD-10-CM

## 2023-12-13 MED ORDER — TRIAMCINOLONE ACETONIDE 0.1 % EX CREA: 1.0000 | TOPICAL_CREAM | Freq: Two times a day (BID) | CUTANEOUS | 1 refills | Status: AC

## 2023-12-14 ENCOUNTER — Other Ambulatory Visit: Payer: Self-pay | Admitting: Nurse Practitioner

## 2023-12-14 MED ORDER — CIPROFLOXACIN HCL 500 MG PO TABS: 500.0000 mg | ORAL_TABLET | Freq: Two times a day (BID) | ORAL | 0 refills | Status: AC

## 2024-05-29 ENCOUNTER — Other Ambulatory Visit: Payer: Self-pay

## 2024-05-29 MED ORDER — AZITHROMYCIN 250 MG PO TABS: ORAL_TABLET | ORAL | 0 refills | Status: AC

## 2024-05-29 NOTE — Telephone Encounter (Signed)
 Pt is having sinus infection as per alyssa sent Zpak

## 2024-06-22 ENCOUNTER — Other Ambulatory Visit: Payer: Self-pay | Admitting: Nurse Practitioner

## 2024-06-22 MED ORDER — CLOBETASOL PROPIONATE 0.05 % EX CREA: 1.0000 | TOPICAL_CREAM | Freq: Two times a day (BID) | CUTANEOUS | 0 refills | Status: AC

## 2024-06-29 ENCOUNTER — Encounter: Payer: Self-pay | Admitting: Oncology

## 2024-07-06 ENCOUNTER — Ambulatory Visit: Admitting: Nurse Practitioner
